# Patient Record
Sex: Male | Born: 1940
Health system: Southern US, Community
[De-identification: ages and names within clinical notes are randomized; demographics above are authoritative.]

## PROBLEM LIST (undated history)

## (undated) DIAGNOSIS — R002 Palpitations: Secondary | ICD-10-CM

## (undated) DIAGNOSIS — I739 Peripheral vascular disease, unspecified: Secondary | ICD-10-CM

## (undated) DIAGNOSIS — R0602 Shortness of breath: Secondary | ICD-10-CM

## (undated) DIAGNOSIS — N529 Male erectile dysfunction, unspecified: Secondary | ICD-10-CM

## (undated) DIAGNOSIS — R9430 Abnormal result of cardiovascular function study, unspecified: Secondary | ICD-10-CM

## (undated) DIAGNOSIS — I4891 Unspecified atrial fibrillation: Secondary | ICD-10-CM

## (undated) DIAGNOSIS — E785 Hyperlipidemia, unspecified: Secondary | ICD-10-CM

## (undated) DIAGNOSIS — Z8489 Family history of other specified conditions: Secondary | ICD-10-CM

## (undated) DIAGNOSIS — I2510 Atherosclerotic heart disease of native coronary artery without angina pectoris: Secondary | ICD-10-CM

## (undated) DIAGNOSIS — J449 Chronic obstructive pulmonary disease, unspecified: Secondary | ICD-10-CM

## (undated) DIAGNOSIS — R911 Solitary pulmonary nodule: Secondary | ICD-10-CM

## (undated) DIAGNOSIS — IMO0002 Reserved for concepts with insufficient information to code with codable children: Secondary | ICD-10-CM

## (undated) DIAGNOSIS — I779 Disorder of arteries and arterioles, unspecified: Secondary | ICD-10-CM

## (undated) DIAGNOSIS — K219 Gastro-esophageal reflux disease without esophagitis: Secondary | ICD-10-CM

## (undated) DIAGNOSIS — I714 Abdominal aortic aneurysm, without rupture, unspecified: Secondary | ICD-10-CM

## (undated) DIAGNOSIS — I10 Essential (primary) hypertension: Secondary | ICD-10-CM

## (undated) HISTORY — DX: Peripheral vascular disease, unspecified: I73.9

## (undated) HISTORY — DX: Essential (primary) hypertension: I10

## (undated) HISTORY — DX: Hyperlipidemia, unspecified: E78.5

## (undated) HISTORY — DX: Disorder of arteries and arterioles, unspecified: I77.9

## (undated) HISTORY — DX: Unspecified atrial fibrillation: I48.91

## (undated) HISTORY — DX: Atherosclerotic heart disease of native coronary artery without angina pectoris: I25.10

## (undated) HISTORY — DX: Abnormal result of cardiovascular function study, unspecified: R94.30

## (undated) HISTORY — DX: Abdominal aortic aneurysm, without rupture: I71.4

## (undated) HISTORY — DX: Gastro-esophageal reflux disease without esophagitis: K21.9

## (undated) HISTORY — DX: Palpitations: R00.2

## (undated) HISTORY — DX: Male erectile dysfunction, unspecified: N52.9

## (undated) HISTORY — DX: Solitary pulmonary nodule: R91.1

## (undated) HISTORY — DX: Abdominal aortic aneurysm, without rupture, unspecified: I71.40

## (undated) HISTORY — PX: BACK SURGERY: SHX140

## (undated) HISTORY — DX: Shortness of breath: R06.02

## (undated) HISTORY — DX: Chronic obstructive pulmonary disease, unspecified: J44.9

## (undated) HISTORY — DX: Reserved for concepts with insufficient information to code with codable children: IMO0002

---

## 2001-11-18 ENCOUNTER — Encounter: Payer: Self-pay | Admitting: Internal Medicine

## 2001-12-10 ENCOUNTER — Ambulatory Visit (HOSPITAL_COMMUNITY): Admission: RE | Admit: 2001-12-10 | Discharge: 2001-12-10 | Payer: Self-pay | Admitting: Cardiology

## 2002-10-05 ENCOUNTER — Encounter: Payer: Self-pay | Admitting: *Deleted

## 2002-10-05 ENCOUNTER — Emergency Department (HOSPITAL_COMMUNITY): Admission: EM | Admit: 2002-10-05 | Discharge: 2002-10-05 | Payer: Self-pay | Admitting: *Deleted

## 2002-11-26 ENCOUNTER — Ambulatory Visit (HOSPITAL_COMMUNITY): Admission: RE | Admit: 2002-11-26 | Discharge: 2002-11-27 | Payer: Self-pay | Admitting: Surgery

## 2002-11-26 ENCOUNTER — Encounter: Payer: Self-pay | Admitting: Surgery

## 2002-11-26 ENCOUNTER — Encounter (INDEPENDENT_AMBULATORY_CARE_PROVIDER_SITE_OTHER): Payer: Self-pay | Admitting: Specialist

## 2002-11-26 HISTORY — PX: CHOLECYSTECTOMY: SHX55

## 2003-01-05 ENCOUNTER — Ambulatory Visit (HOSPITAL_COMMUNITY): Admission: RE | Admit: 2003-01-05 | Discharge: 2003-01-05 | Payer: Self-pay | Admitting: Internal Medicine

## 2003-01-22 ENCOUNTER — Ambulatory Visit (HOSPITAL_COMMUNITY): Admission: RE | Admit: 2003-01-22 | Discharge: 2003-01-22 | Payer: Self-pay | Admitting: Cardiothoracic Surgery

## 2003-01-22 ENCOUNTER — Encounter: Payer: Self-pay | Admitting: Vascular Surgery

## 2003-07-07 ENCOUNTER — Encounter: Admission: RE | Admit: 2003-07-07 | Discharge: 2003-07-07 | Payer: Self-pay | Admitting: Internal Medicine

## 2004-01-04 ENCOUNTER — Ambulatory Visit (HOSPITAL_COMMUNITY): Admission: RE | Admit: 2004-01-04 | Discharge: 2004-01-04 | Payer: Self-pay | Admitting: Cardiology

## 2004-03-02 ENCOUNTER — Ambulatory Visit (HOSPITAL_COMMUNITY): Admission: RE | Admit: 2004-03-02 | Discharge: 2004-03-02 | Payer: Self-pay | Admitting: Vascular Surgery

## 2004-03-06 ENCOUNTER — Ambulatory Visit (HOSPITAL_COMMUNITY): Admission: RE | Admit: 2004-03-06 | Discharge: 2004-03-06 | Payer: Self-pay | Admitting: Vascular Surgery

## 2004-03-27 ENCOUNTER — Ambulatory Visit (HOSPITAL_COMMUNITY): Admission: RE | Admit: 2004-03-27 | Discharge: 2004-03-27 | Payer: Self-pay | Admitting: Vascular Surgery

## 2004-04-06 ENCOUNTER — Inpatient Hospital Stay (HOSPITAL_COMMUNITY): Admission: RE | Admit: 2004-04-06 | Discharge: 2004-04-13 | Payer: Self-pay | Admitting: Vascular Surgery

## 2004-04-06 ENCOUNTER — Encounter (INDEPENDENT_AMBULATORY_CARE_PROVIDER_SITE_OTHER): Payer: Self-pay | Admitting: Specialist

## 2004-04-06 DIAGNOSIS — Z9889 Other specified postprocedural states: Secondary | ICD-10-CM

## 2004-04-06 HISTORY — PX: ABDOMINAL AORTIC ANEURYSM REPAIR: SUR1152

## 2004-06-13 ENCOUNTER — Ambulatory Visit: Payer: Self-pay | Admitting: Cardiology

## 2005-06-22 ENCOUNTER — Ambulatory Visit: Payer: Self-pay | Admitting: Internal Medicine

## 2005-06-22 ENCOUNTER — Ambulatory Visit: Payer: Self-pay | Admitting: Cardiology

## 2005-11-16 ENCOUNTER — Ambulatory Visit: Payer: Self-pay | Admitting: Internal Medicine

## 2005-11-17 ENCOUNTER — Ambulatory Visit: Payer: Self-pay | Admitting: Family Medicine

## 2005-11-21 ENCOUNTER — Ambulatory Visit: Payer: Self-pay | Admitting: Internal Medicine

## 2006-01-07 ENCOUNTER — Ambulatory Visit: Payer: Self-pay | Admitting: Internal Medicine

## 2006-01-21 ENCOUNTER — Ambulatory Visit: Payer: Self-pay | Admitting: Cardiology

## 2006-03-11 ENCOUNTER — Ambulatory Visit: Payer: Self-pay | Admitting: Internal Medicine

## 2006-07-18 ENCOUNTER — Ambulatory Visit: Payer: Self-pay | Admitting: Cardiology

## 2008-04-08 ENCOUNTER — Ambulatory Visit: Payer: Self-pay | Admitting: Cardiology

## 2008-04-21 ENCOUNTER — Encounter: Payer: Self-pay | Admitting: Cardiology

## 2008-04-21 ENCOUNTER — Encounter: Payer: Self-pay | Admitting: Internal Medicine

## 2008-04-21 ENCOUNTER — Ambulatory Visit: Payer: Self-pay

## 2008-06-22 ENCOUNTER — Telehealth: Payer: Self-pay | Admitting: Internal Medicine

## 2008-06-25 ENCOUNTER — Ambulatory Visit: Payer: Self-pay | Admitting: Internal Medicine

## 2008-06-25 DIAGNOSIS — I10 Essential (primary) hypertension: Secondary | ICD-10-CM

## 2008-06-25 DIAGNOSIS — E785 Hyperlipidemia, unspecified: Secondary | ICD-10-CM

## 2008-06-25 DIAGNOSIS — N529 Male erectile dysfunction, unspecified: Secondary | ICD-10-CM | POA: Insufficient documentation

## 2008-06-28 LAB — CONVERTED CEMR LAB
ALT: 17 units/L (ref 0–53)
AST: 19 units/L (ref 0–37)
Albumin: 3.9 g/dL (ref 3.5–5.2)
Alkaline Phosphatase: 83 units/L (ref 39–117)
BUN: 16 mg/dL (ref 6–23)
Basophils Absolute: 0 10*3/uL (ref 0.0–0.1)
Basophils Relative: 0.5 % (ref 0.0–3.0)
Bilirubin, Direct: 0.1 mg/dL (ref 0.0–0.3)
CO2: 30 meq/L (ref 19–32)
Calcium: 9.3 mg/dL (ref 8.4–10.5)
Chloride: 105 meq/L (ref 96–112)
Cholesterol: 95 mg/dL (ref 0–200)
Creatinine, Ser: 1.1 mg/dL (ref 0.4–1.5)
Eosinophils Absolute: 0.1 10*3/uL (ref 0.0–0.7)
Eosinophils Relative: 1.9 % (ref 0.0–5.0)
GFR calc Af Amer: 86 mL/min
GFR calc non Af Amer: 71 mL/min
Glucose, Bld: 77 mg/dL (ref 70–99)
HCT: 47.3 % (ref 39.0–52.0)
HDL: 34.7 mg/dL — ABNORMAL LOW (ref >39.0)
Hemoglobin: 16.1 g/dL (ref 13.0–17.0)
LDL Cholesterol: 47 mg/dL (ref 0–99)
Lymphocytes Relative: 21.4 % (ref 12.0–46.0)
MCHC: 34 g/dL (ref 30.0–36.0)
MCV: 92.6 fL (ref 78.0–100.0)
Monocytes Absolute: 0.6 10*3/uL (ref 0.1–1.0)
Monocytes Relative: 10.9 % (ref 3.0–12.0)
Neutro Abs: 3.3 10*3/uL (ref 1.4–7.7)
Neutrophils Relative %: 65.3 % (ref 43.0–77.0)
Phosphorus: 3.2 mg/dL (ref 2.3–4.6)
Platelets: 180 10*3/uL (ref 150–400)
Potassium: 4.3 meq/L (ref 3.5–5.1)
RBC: 5.11 M/uL (ref 4.22–5.81)
RDW: 12.6 % (ref 11.5–14.6)
Sodium: 141 meq/L (ref 135–145)
TSH: 1.5 microintl units/mL (ref 0.35–5.50)
Total Bilirubin: 0.7 mg/dL (ref 0.3–1.2)
Total CHOL/HDL Ratio: 2.7
Total Protein: 6.6 g/dL (ref 6.0–8.3)
Triglycerides: 65 mg/dL (ref 0–149)
VLDL: 13 mg/dL (ref 0–40)
WBC: 5.1 10*3/uL (ref 4.5–10.5)

## 2008-07-15 ENCOUNTER — Telehealth: Payer: Self-pay | Admitting: Internal Medicine

## 2009-04-02 DIAGNOSIS — Z8719 Personal history of other diseases of the digestive system: Secondary | ICD-10-CM

## 2009-04-02 DIAGNOSIS — J439 Emphysema, unspecified: Secondary | ICD-10-CM

## 2009-05-01 ENCOUNTER — Encounter: Payer: Self-pay | Admitting: Cardiology

## 2009-05-03 ENCOUNTER — Ambulatory Visit: Payer: Self-pay | Admitting: Cardiology

## 2009-05-04 ENCOUNTER — Encounter: Payer: Self-pay | Admitting: Cardiology

## 2009-05-17 ENCOUNTER — Ambulatory Visit: Payer: Self-pay

## 2009-05-17 ENCOUNTER — Encounter: Payer: Self-pay | Admitting: Cardiology

## 2010-04-10 ENCOUNTER — Encounter: Payer: Self-pay | Admitting: Cardiology

## 2010-04-11 ENCOUNTER — Ambulatory Visit: Payer: Self-pay | Admitting: Cardiology

## 2010-04-12 ENCOUNTER — Telehealth: Payer: Self-pay | Admitting: Cardiology

## 2010-04-12 ENCOUNTER — Encounter: Payer: Self-pay | Admitting: Cardiology

## 2010-04-17 ENCOUNTER — Ambulatory Visit: Payer: Self-pay | Admitting: Cardiovascular Disease

## 2010-04-19 ENCOUNTER — Ambulatory Visit: Payer: Self-pay | Admitting: Internal Medicine

## 2010-04-20 LAB — CONVERTED CEMR LAB
Albumin: 4.1 g/dL (ref 3.5–5.2)
BUN: 16 mg/dL (ref 6–23)
Basophils Absolute: 0 10*3/uL (ref 0.0–0.1)
Basophils Relative: 0.4 % (ref 0.0–3.0)
CO2: 29 meq/L (ref 19–32)
Calcium: 9.9 mg/dL (ref 8.4–10.5)
Chloride: 109 meq/L (ref 96–112)
Creatinine, Ser: 1.2 mg/dL (ref 0.4–1.5)
Eosinophils Absolute: 0.1 10*3/uL (ref 0.0–0.7)
Eosinophils Relative: 1.8 % (ref 0.0–5.0)
GFR calc non Af Amer: 65.4 mL/min (ref >60)
Glucose, Bld: 68 mg/dL — ABNORMAL LOW (ref 70–99)
HCT: 46.7 % (ref 39.0–52.0)
Hemoglobin: 15.9 g/dL (ref 13.0–17.0)
Lymphocytes Relative: 23.4 % (ref 12.0–46.0)
Lymphs Abs: 1.5 10*3/uL (ref 0.7–4.0)
MCHC: 34.2 g/dL (ref 30.0–36.0)
MCV: 92.3 fL (ref 78.0–100.0)
Monocytes Absolute: 0.8 10*3/uL (ref 0.1–1.0)
Monocytes Relative: 11.5 % (ref 3.0–12.0)
Neutro Abs: 4.1 10*3/uL (ref 1.4–7.7)
Neutrophils Relative %: 62.9 % (ref 43.0–77.0)
PSA: 1.51 ng/mL (ref 0.10–4.00)
Phosphorus: 3.3 mg/dL (ref 2.3–4.6)
Platelets: 182 10*3/uL (ref 150.0–400.0)
Potassium: 4.5 meq/L (ref 3.5–5.1)
RBC: 5.06 M/uL (ref 4.22–5.81)
RDW: 13.3 % (ref 11.5–14.6)
Sodium: 144 meq/L (ref 135–145)
TSH: 1.33 microintl units/mL (ref 0.35–5.50)
WBC: 6.5 10*3/uL (ref 4.5–10.5)

## 2010-06-06 HISTORY — PX: PILONIDAL CYST EXCISION: SHX744

## 2010-06-21 ENCOUNTER — Telehealth: Payer: Self-pay | Admitting: Cardiology

## 2010-09-03 LAB — CONVERTED CEMR LAB
ALT: 21 units/L (ref 0–53)
AST: 23 units/L (ref 0–37)
Albumin: 4 g/dL (ref 3.5–5.2)
Alkaline Phosphatase: 103 units/L (ref 39–117)
Bilirubin, Direct: 0.1 mg/dL (ref 0.0–0.3)
Cholesterol: 103 mg/dL (ref 0–200)
Cholesterol: 109 mg/dL (ref 0–200)
HDL: 33.5 mg/dL — ABNORMAL LOW (ref >39.00)
HDL: 41.3 mg/dL (ref >39.00)
LDL Cholesterol: 58 mg/dL (ref 0–99)
LDL Cholesterol: 59 mg/dL (ref 0–99)
Total Bilirubin: 0.6 mg/dL (ref 0.3–1.2)
Total CHOL/HDL Ratio: 3
Total CHOL/HDL Ratio: 3
Total Protein: 6.9 g/dL (ref 6.0–8.3)
Triglycerides: 45 mg/dL (ref 0.0–149.0)
Triglycerides: 56 mg/dL (ref 0.0–149.0)
VLDL: 11.2 mg/dL (ref 0.0–40.0)
VLDL: 9 mg/dL (ref 0.0–40.0)

## 2010-09-05 NOTE — Assessment & Plan Note (Signed)
Summary: yearly/sl      Allergies Added: NKDA  Visit Type:  Follow-up Primary Provider:  Tillman Abide, MD  CC:  CAD.  History of Present Illness: The patient is seen for followup of coronary artery disease.  He is doing well.  Is not having chest pain or shortness of breath.  Is fully active.  I saw him last May 03, 2009.  He's had followup carotid Doppler since then which showed mild stable disease.  He has not had follow up lipids and this will be arranged soon.  Current Medications (verified): 1)  Crestor 20 Mg  Tabs (Rosuvastatin Calcium) .... Take 1 Tablet By Mouth Once A Day 2)  Dilt-Cd 240 Mg Xr24h-Cap (Diltiazem Hcl Coated Beads) .Marland Kitchen.. 1 Daily By Mouth 3)  Bayer Aspirin Ec Low Dose 81 Mg Tbec (Aspirin) .Marland Kitchen.. 1daily By Mouth 4)  Metoprolol Tartrate 50 Mg Tabs (Metoprolol Tartrate) .... Take 1/2 Tablet By Mouth Two Times A Day  Allergies (verified): No Known Drug Allergies  Past History:  Past Medical History: CAD... moderate.... catheterization 2005... / nuclear scan September 2009 no ischemia EF... 60%... echo.  September, 2009 Carotid artery disease... minimal  ..Doppler 2004... 0-39% bilateral  /   doppler..05/17/2009....0-39% bilateral GERD,HX OF BACK SURGERY (1970) (ICD-V45.89) CHOLECYSTECTOMY, HX OF (ICD-V45.79) DYSPNEA ON EXERTION (ICD-786.09) * CYST ON BUTTOCKS,HX OF (HEALED) HEMORRHOIDS, HX OF (ICD-V12.79) ATRIAL FIBRILLATION, HX OF (ICD-V12.59)...postoperative in the past PALPITATIONS, HX OF (ICD-V12.50) COPD (ICD-496) LUNG NODULE,HX OF (ICD-518.89)...followup by pulmonary in the past. AAA... repaired 2005 Dr.Dickson ERECTILE DYSFUNCTION, ORGANIC (ICD-607.84) HYPERLIPIDEMIA (ICD-272.4) HYPERTENSION (ICD-401.9) Cholecystectomy Tobacco abuse  Review of Systems       Patient denies fever, chills, headache, sweats, rash, change in vision, change in hearing, chest pain, cough or vomiting, urinary symptoms.  All other systems are reviewed and are  negative.  Vital Signs:  Patient profile:   70 year old male Height:      69.50 inches Weight:      176 pounds BMI:     25.71 Pulse rate:   68 / minute BP sitting:   142 / 76  (left arm) Cuff size:   regular  Vitals Entered By: Hardin Negus, RMA (April 11, 2010 10:06 AM)  Physical Exam  General:  patient is stable. Eyes:  no xanthelasma. Neck:  no carotid bruits. Lungs:  lungs are clear.  Respiratory effort is nonlabored. Heart:  cardiac exam reveals S1-S2.  No clicks or significant murmurs. Abdomen:  abdomen is soft. Extremities:  no peripheral edema. Psych:  patient is oriented to person time and place.  Affect is normal.   Impression & Recommendations:  Problem # 1:  CAROTID ARTERY DISEASE (ICD-433.10)  His updated medication list for this problem includes:    Bayer Aspirin Ec Low Dose 81 Mg Tbec (Aspirin) .Marland Kitchen... 1daily by mouth The patient's carotids are being followed and they are stable.  Problem # 2:  DYSPNEA ON EXERTION (ICD-786.09)  His updated medication list for this problem includes:    Dilt-cd 240 Mg Xr24h-cap (Diltiazem hcl coated beads) .Marland Kitchen... 1 daily by mouth    Bayer Aspirin Ec Low Dose 81 Mg Tbec (Aspirin) .Marland Kitchen... 1daily by mouth    Metoprolol Tartrate 50 Mg Tabs (Metoprolol tartrate) .Marland Kitchen... Take 1/2 tablet by mouth two times a day Is no shortness of breath.  No further workup.  Problem # 3:  PALPITATIONS, HX OF (ICD-V12.50) Patient has no palpitations.  No further workup.  Problem # 4:  ABDOMINAL AORTIC ANEURYSM REPAIR,  HX OF (ICD-V15.1) The patient's abdominal aortic aneurysm was repaired with 2005.  I will decide in the future if he should have a followup ultrasound.  Problem # 5:  CORONARY ARTERY DISEASE (ICD-414.00)  His updated medication list for this problem includes:    Dilt-cd 240 Mg Xr24h-cap (Diltiazem hcl coated beads) .Marland Kitchen... 1 daily by mouth    Bayer Aspirin Ec Low Dose 81 Mg Tbec (Aspirin) .Marland Kitchen... 1daily by mouth    Metoprolol  Tartrate 50 Mg Tabs (Metoprolol tartrate) .Marland Kitchen... Take 1/2 tablet by mouth two times a day Coronary disease is stable.  EKG is done today and reviewed by me.  It is normal.  No further workup.  Problem # 6:  HYPERLIPIDEMIA (ICD-272.4)  His updated medication list for this problem includes:    Crestor 20 Mg Tabs (Rosuvastatin calcium) .Marland Kitchen... Take 1 tablet by mouth once a day The patient needs to have fasting lipids.  This will be arranged.  He'll see him back in one year for followup.  Orders: TLB-Lipid Panel (80061-LIPID)  Problem # 7:  LUNG NODULE,HX OF (ICD-518.89)  The patient has had lung evaluations in the past.  There was question of needing followup.  When questioning him today he tells me that he has not had any significant followup.  This is necessary.  Chest x-ray will be done today or in the near future and I am arranging for him to see Dr.Letvak for general followup and to decide if any pulmonary followup as needed.  Orders: T-2 View CXR (71020TC)  Problem # 8:  TOBACCO ABUSE (ICD-305.1) The patient is using a small amount of chewing tobacco.  I counseled him to stop completely.  Other Orders: EKG w/ Interpretation (93000)  Patient Instructions: 1)  Your physician recommends that you return for a FASTING lipid profile: (272.2) 2)  A chest x-ray takes a picture of the organs and structures inside the chest, including the heart, lungs, and blood vessels. This test can show several things, including, whether the heart is enlarged; whether fluid is building up in the lungs; and whether pacemaker / defibrillator leads are still in place. 3)  You have been referred to Dr Alphonsus Sias for follow-up appt. regarding your lung nodule, we will send him a copy of your chest xray. 4)  Your physician wants you to follow-up in:  1 year.  You will receive a reminder letter in the mail two months in advance. If you don't receive a letter, please call our office to schedule the follow-up  appointment.   Appended Document: yearly/sl Rich, Please see this note.  I ordered a CXRay for today. Patient is to see you next week.    Trey Paula

## 2010-09-05 NOTE — Progress Notes (Signed)
Summary: pt at dentist ofc   Phone Note From Other Clinic   Caller: Nurse Summary of Call: per Clydie Braun Dr Precious Bard pt is at office to have extractions. Pt has infection what premeds or other does pt need?  ofc [phone]  can speak with anyone concerning pt.  Initial call taken by: Edman Circle,  June 21, 2010 2:10 PM  Follow-up for Phone Call        they have given pt a dose of amoxicillin wanted to make sure Dr Myrtis Ser was ok w/this and didn't want to do anything else, ok per Dr Boykin Nearing, RN  June 21, 2010 2:48 PM

## 2010-09-05 NOTE — Miscellaneous (Signed)
  Clinical Lists Changes  Observations: Added new observation of PAST MED HX: CAD... moderate.... catheterization 2005... / nuclear scan September 2009 no ischemia EF... 60%... echo.  September, 2009 Carotid artery disease... minimal  ..Doppler 2004... 0-39% bilateral  /   doppler..05/17/2009....0-39% bilateral GERD,HX OF BACK SURGERY (1970) (ICD-V45.89) CHOLECYSTECTOMY, HX OF (ICD-V45.79) DYSPNEA ON EXERTION (ICD-786.09) * CYST ON BUTTOCKS,HX OF (HEALED) HEMORRHOIDS, HX OF (ICD-V12.79) ATRIAL FIBRILLATION, HX OF (ICD-V12.59)...postoperative in the past PALPITATIONS, HX OF (ICD-V12.50) COPD (ICD-496) LUNG NODULE,HX OF (ICD-518.89) AAA... repaired 2005 Dr.Dickson ERECTILE DYSFUNCTION, ORGANIC (ICD-607.84) HYPERLIPIDEMIA (ICD-272.4) HYPERTENSION (ICD-401.9) Cholecystectomy  (04/10/2010 13:04) Added new observation of PRIMARY MD: Tillman Abide, MD (04/10/2010 13:04)       Past History:  Past Medical History: CAD... moderate.... catheterization 2005... / nuclear scan September 2009 no ischemia EF... 60%... echo.  September, 2009 Carotid artery disease... minimal  ..Doppler 2004... 0-39% bilateral  /   doppler..05/17/2009....0-39% bilateral GERD,HX OF BACK SURGERY (1970) (ICD-V45.89) CHOLECYSTECTOMY, HX OF (ICD-V45.79) DYSPNEA ON EXERTION (ICD-786.09) * CYST ON BUTTOCKS,HX OF (HEALED) HEMORRHOIDS, HX OF (ICD-V12.79) ATRIAL FIBRILLATION, HX OF (ICD-V12.59)...postoperative in the past PALPITATIONS, HX OF (ICD-V12.50) COPD (ICD-496) LUNG NODULE,HX OF (ICD-518.89) AAA... repaired 2005 Dr.Dickson ERECTILE DYSFUNCTION, ORGANIC (ICD-607.84) HYPERLIPIDEMIA (ICD-272.4) HYPERTENSION (ICD-401.9) Cholecystectomy

## 2010-09-05 NOTE — Assessment & Plan Note (Signed)
Summary: dr Myrtis Ser ref for lung nogule/dlo   Vital Signs:  Patient profile:   70 year old male Weight:      177 pounds Temp:     98.6 degrees F oral Pulse rate:   60 / minute Pulse rhythm:   regular BP sitting:   110 / 70  (left arm) Cuff size:   large  Vitals Entered By: Mervin Hack CMA Duncan Dull) (April 19, 2010 11:15 AM) CC: lung nodule   History of Present Illness: Doing okay in general Now walking 3 miles per day Still hunting--waiting for season to start and working on deer stands  Heart has been good Good stamina No chest pain Notes DOE with strenuous exercise---like heavy lifting (not just walking though) No edema No palpitations  urine is fine No nocturia no daytime problems  Good mood no depression  No stomach troubles  Occ cough No sig mucus   Preventive Screening-Counseling & Management  Alcohol-Tobacco     Smoking Status: quit > 6 months     Smoking Cessation Counseling: no  Allergies: No Known Drug Allergies  Past History:  Past medical, surgical, family and social histories (including risk factors) reviewed for relevance to current acute and chronic problems.  Past Medical History: CAD... moderate.... catheterization 2005... / nuclear scan September 2009 no ischemia               EF... 60%... echo.  September, 2009 Carotid artery disease... minimal  ..Doppler 2004... 0-39% bilateral  /   doppler..05/17/2009....0-39% bilateral GERD Hemorrhoids Atrial fibrillation...postoperative in the past COPD  Lung nodule...followup by pulmonary in the past. Erectile dysfunction Hyperliidemia Hypertension  Past Surgical History: BACK SURGERY (1970) ABDOMINAL AORTIC ANEURYSM REPAIR,( 04-06-2004)--Dr [facility] TRANSLUMINAL CORONARY ANGIOPLASTY,( 12-10-2001) & (01-04-2004) CHOLECYSTECTOMY (11-26-2002)  Family History: Reviewed history from 06/25/2008 and no changes required. Dad died @69  MI Mom died @64  CVA, DM, ?HTN All mom's family  died of strokes 2 brothers --1 died of suicide 3 sisters--1 died of gall bladder cancer  Social History: Reviewed history from 06/25/2008 and no changes required. 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   Neck:  supple, no masses, no thyromegaly, no carotid bruits, and no cervical lymphadenopathy.   Lungs:  normal respiratory effort, no intercostal retractions, no accessory muscle use, and normal breath sounds.   Heart:  normal rate, regular rhythm, no murmur, and no gallop.   Abdomen:  soft and non-tender.   Msk:  no joint tenderness and no joint swelling.   Extremities:  no edema Psych:  normally interactive, good eye contact, not anxious appearing, and not depressed appearing.     Impression & Recommendations:  Problem # 1:  LUNG NODULE,HX OF (ICD-518.89) Assessment Comment Only recent CT scan okay will plan yearly follow up  Problem # 2:  HYPERTENSION (ICD-401.9) Assessment: Unchanged  good control no changes needed  His updated medication list for this problem includes:    Metoprolol Tartrate 50 Mg Tabs (Metoprolol tartrate) .Marland Kitchen... Take 1/2 tablet by mouth two times a day    Dilt-cd 240 Mg Xr24h-cap (Diltiazem hcl coated beads) .Marland Kitchen... 1 daily by mouth  BP today: 110/70 Prior BP: 142/76 (04/11/2010)  Labs Reviewed: K+: 4.3 (06/25/2008) Creat: : 1.1 (06/25/2008)   Chol: 103 (04/11/2010)   HDL: 33.50 (04/11/2010)   LDL: 58 (04/11/2010)   TG: 56.0 (04/11/2010)  Orders: TLB-Renal Function Panel (80069-RENAL) TLB-TSH (Thyroid Stimulating Hormone) (84443-TSH) TLB-CBC Platelet - w/Differential (85025-CBCD) Venipuncture (16109)  Problem # 3:   HYPERLIPIDEMIA (ICD-272.4) Assessment: Unchanged at goal on Rx no changes needed  His updated medication list for this problem includes:    Crestor 20 Mg Tabs (Rosuvastatin calcium) .Marland Kitchen... Take 1 tablet by mouth once a day  Labs Reviewed: SGOT: 23 (05/03/2009)   SGPT: 21 (05/03/2009)   HDL:33.50 (04/11/2010), 41.30 (05/03/2009)  LDL:58 (04/11/2010), 59 (05/03/2009)  Chol:103 (04/11/2010), 109 (05/03/2009)  Trig:56.0 (04/11/2010), 45.0 (05/03/2009)  Problem # 4:  COPD (ICD-496) Assessment: Comment Only no sig symptoms Rx not indicated  Problem # 5:  Screening PSA (ICD-V76.44) Assessment: Comment Only discussed will check today and then not again if normal  Complete Medication List: 1)  Metoprolol Tartrate 50 Mg Tabs (Metoprolol tartrate) .... Take 1/2 tablet by mouth two times a day 2)  Crestor 20 Mg Tabs (Rosuvastatin calcium) .... Take 1 tablet by mouth once a day 3)  Dilt-cd 240 Mg Xr24h-cap (Diltiazem hcl coated beads) .Marland Kitchen.. 1 daily by mouth 4)  Bayer Aspirin Ec Low Dose 81 Mg Tbec (Aspirin) .Marland Kitchen.. 1daily by mouth  Other Orders: TLB-PSA (Prostate Specific Antigen) (84153-PSA)  Patient Instructions: 1)  Please schedule a follow-up appointment in 1 year.   Current Allergies (reviewed today): No known allergies    Appended Document: dr Myrtis Ser ref for lung nogule/dlo     Clinical Lists Changes  Orders: Added new Service order of Tdap => 44yrs IM (60454) - Signed Added new Service order of Admin 1st Vaccine (09811) - Signed Added new Service order of Admin 1st Vaccine [facility]) [phone]) - Signed Added new Service order of Flu Vaccine 64yrs + (95621) - Signed Added new Service order of Admin of Any Addtl Vaccine (30865) - Signed Added new Service order of Admin of Any Addtl Vaccine (State) [phone]) - Signed Observations: Added new observation of FLU VAX#1VIS: 02/28/10 version given April 19, 2010. (04/19/2010 12:37) Added new observation of FLU VAXLOT: [id number]  (04/19/2010 12:37) Added new observation of FLU VAX EXP: 02/03/2011 (04/19/2010 12:37) Added new observation of FLU VAXBY: DeShannon Smith CMA (AAMA) (04/19/2010 12:37) Added new observation of FLU VAXRTE: IM (04/19/2010 12:37) Added new observation of FLU VAX DSE: 0.5 ml (04/19/2010 12:37) Added new observation of FLU VAXMFR: Gl[employer] (04/19/2010 12:37) Added new observation of FLU VAX SITE: left deltoid (04/19/2010 12:37) Added new observation of FLU VAX: Fluvax 3+ (04/19/2010 12:37) Added new observation of TD BOOST VIS: 06/23/08 version given April 19, 2010. (04/19/2010 12:37) Added new observation of TD BOOSTERLO: [id number] (04/19/2010 12:37) Added new observation of TD BOOST EXP: 04/26/2012 (04/19/2010 12:37) Added new observation of TD BOOSTERBY: DeShannon Smith CMA (AAMA) (04/19/2010 12:37) Added new observation of TD BOOSTERRT: IM (04/19/2010 12:37) Added new observation of TDBOOSTERDSE: 0.5 ml (04/19/2010 12:37) Added new observation of TD BOOSTERMF: Gl[employer] (04/19/2010 12:37) Added new observation of TD BOOST SIT: right deltoid (04/19/2010 12:37) Added new observation of TD BOOSTER: Tdap (04/19/2010 12:37)       Tetanus/Td Vaccine    Vaccine Type: Tdap    Site: right deltoid    Mfr: GlaxoSmithKline    Dose: 0.5 ml    Route: IM    Given by: Mervin Hack CMA (AAMA)    Exp. Date: 04/26/2012    Lot #:  [id number]    VIS given: 06/23/08 version given April 19, 2010.  Influenza Vaccine    Vaccine Type: Fluvax 3+    Site: left deltoid    Mfr: GlaxoSmithKline    Dose: 0.5 ml    Route: IM    Given by: Mervin Hack CMA (AAMA)    Exp. Date: 02/03/2011    Lot #: [id number]    VIS given: 02/28/10 version given April 19, 2010.  Flu Vaccine Consent Questions    Do you have a history of severe allergic reactions to this vaccine? no    Any prior history of allergic reactions to egg and/or gelatin? no    Do you have a sensitivity to the  preservative Thimersol? no    Do you have a past history of Guillan-Barre Syndrome? no    Do you currently have an acute febrile illness? no    Have you ever had a severe reaction to latex? no    Vaccine information given and explained to patient? yes

## 2010-09-05 NOTE — Letter (Signed)
Summary: Custom - Lipid  [facility], Main Office  [address]   [place], [place] [address]   Phone: [phone]  Fax: [phone]     April 12, 2010 MRN: [id number]   JIMI SCHAPPERT [address] [place], [place]  [address]   Dear Mr. Kazmi,  We have reviewed your cholesterol results.  They are as follows:     Total Cholesterol:    103 (Desirable: less than 200)       HDL  Cholesterol:     33.50  (Desirable: greater than 40 for men and 50 for women)       LDL Cholesterol:       58  (Desirable: less than 100 for low risk and less than 70 for moderate to high risk)       Triglycerides:       56.0  (Desirable: less than 150)  Our recommendations include:  Looks Good, continue current medications   Call our office at the number listed above if you have any questions.  Lowering your LDL cholesterol is important, but it is only one of a large number of "risk factors" that may indicate that you are at risk for heart disease, stroke or other complications of hardening of the arteries.  Other risk factors include:   A.  Cigarette Smoking* B.  High Blood Pressure* C.  Obesity* D.   Low HDL Cholesterol (see yours above)* E.   Diabetes Mellitus (higher risk if your is uncontrolled) F.  Family history of premature heart disease G.  Previous history of stroke or cardiovascular disease    *These are risk factors YOU HAVE CONTROL OVER.  For more information, visit .  There is now evidence that lowering the TOTAL CHOLESTEROL AND LDL CHOLESTEROL can reduce the risk of heart disease.  The American Heart Association recommends the following guidelines for the treatment of elevated cholesterol:  1.  If there is now current heart disease and less than two risk factors, TOTAL CHOLESTEROL should be less than 200 and LDL CHOLESTEROL should be less than 100. 2.  If there is current heart disease or two or more risk factors, TOTAL CHOLESTEROL should be less than 200 and LDL  CHOLESTEROL should be less than 70.  A diet low in cholesterol, saturated fat, and calories is the cornerstone of treatment for elevated cholesterol.  Cessation of smoking and exercise are also important in the management of elevated cholesterol and preventing vascular disease.  Studies have shown that 30 to 60 minutes of physical activity most days can help lower blood pressure, lower cholesterol, and keep your weight at a healthy level.  Drug therapy is used when cholesterol levels do not respond to therapeutic lifestyle changes (smoking cessation, diet, and exercise) and remains unacceptably high.  If medication is started, it is important to have you levels checked periodically to evaluate the need for further treatment options.  Thank you,    [employer] Team

## 2010-09-05 NOTE — Progress Notes (Signed)
----   Converted from flag ---- ---- 04/12/2010 8:07 AM, Cindee Salt MD wrote: I will get a chest CT scan on him He has a visit next week ------------------------------

## 2010-09-20 ENCOUNTER — Encounter: Payer: Self-pay | Admitting: Family Medicine

## 2010-09-20 ENCOUNTER — Ambulatory Visit (INDEPENDENT_AMBULATORY_CARE_PROVIDER_SITE_OTHER): Payer: Medicare Other | Admitting: Family Medicine

## 2010-09-20 DIAGNOSIS — L0501 Pilonidal cyst with abscess: Secondary | ICD-10-CM | POA: Insufficient documentation

## 2010-09-27 NOTE — Assessment & Plan Note (Signed)
Summary: cyst on back/alc   Vital Signs:  Patient profile:   70 year old male Weight:      179 pounds Temp:     98.4 degrees F oral Pulse rate:   76 / minute Pulse rhythm:   regular BP sitting:   126 / 82  (left arm) Cuff size:   large  Vitals Entered By: Selena Batten Dance CMA (AAMA) (September 20, 2010 10:00 AM) CC: Cyst on tail bone area   History of Present Illness: CC: cyst on tailbone  Noticed cyst there for years.  started irritating a few days ago.  No fevers/chills, abd pain, n/v/d.  Not draining currently but usually eventually drains.  last night unable to sleep because kept aching.  has had in past, pt thinks has been lanced in past maybe 5-7 yeras ago.  recurrent, comes and goes, never fully healed.  Current Medications (verified): 1)  Metoprolol Tartrate 50 Mg Tabs (Metoprolol Tartrate) .... Take 1/2 Tablet By Mouth Two Times A Day 2)  Crestor 20 Mg  Tabs (Rosuvastatin Calcium) .... Take 1 Tablet By Mouth Once A Day 3)  Dilt-Cd 240 Mg Xr24h-Cap (Diltiazem Hcl Coated Beads) .Marland Kitchen.. 1 Daily By Mouth 4)  Bayer Aspirin Ec Low Dose 81 Mg Tbec (Aspirin) .Marland Kitchen.. 1daily By Mouth  Allergies (verified): No Known Drug Allergies  Past History:  Past Medical History: Last updated: 04/19/2010 CAD... moderate.... catheterization 2005... / nuclear scan September 2009 no ischemia               EF... 60%... echo.  September, 2009 Carotid artery disease... minimal  ..Doppler 2004... 0-39% bilateral  /   doppler..05/17/2009....0-39% bilateral GERD Hemorrhoids Atrial fibrillation...postoperative in the past COPD  Lung nodule...followup by pulmonary in the past. 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   Skin:  midline gluteal cleft with small abscess/induration going into right superior buttock.  second abscess right superior gluteal cleft with boil, erythema and tenderness.  L superior gluteal cleft with no erythema but old dimple    Impression & Recommendations:  Problem # 1:  PILONIDAL CYST, WITH ABSCESS (ICD-685.0) concern for pilonidal disease given longstanding duration and multiple lesions today.  recommend warm compresses/warm sitz baths for now, started on abx for 10 days.  surgical referral for eval pilonidal disease as patient would like resolution of recurrent infections.  Orders: Surgical Referral (Surgery)  Complete Medication List: 1)  Metoprolol Tartrate 50 Mg Tabs (Metoprolol tartrate) .... Take 1/2 tablet by mouth two times a day 2)  Crestor 20 Mg Tabs (Rosuvastatin calcium) .... Take 1 tablet by mouth once a day 3)  Dilt-cd 240 Mg Xr24h-cap (Diltiazem hcl coated beads) .Marland Kitchen.. 1 daily by mouth 4)  Bayer Aspirin Ec Low Dose 81 Mg Tbec (Aspirin) .Marland Kitchen.. 1daily by mouth 5)  Bactrim Ds 800-160 Mg Tabs (Sulfamethoxazole-trimethoprim) .... One twice daily x 10 days  Patient Instructions: 1)  Start bactrim for antibiotic. 2)  warm compresses to area as well as warm sitz baths. 3)  referral to surgery for evaluation of pilonidal cyst. 4)  Pass by Marion's office for referral. Prescriptions: BACTRIM DS 800-160 MG TABS (SULFAMETHOXAZOLE-TRIMETHOPRIM) one  twice daily x 10 days  #20 x 0   Entered and Authorized by:   Eustaquio Boyden  MD   Signed by:   Eustaquio Boyden  MD on 09/20/2010   Method used:   Electronically to        [facility]  [address] [phone].* (retail)       [address]       [place], [place]  [address]       Ph: [phone] or [phone]       Fax: [phone]   RxID:    [id number]    Orders Added: 1)  Surgical Referral [Surgery] 2)  Est. Patient Level III [69485]    Current Allergies (reviewed today): No known allergies

## 2010-10-02 ENCOUNTER — Encounter: Payer: Self-pay | Admitting: Internal Medicine

## 2010-10-02 ENCOUNTER — Ambulatory Visit (INDEPENDENT_AMBULATORY_CARE_PROVIDER_SITE_OTHER): Payer: Medicare Other | Admitting: Internal Medicine

## 2010-10-02 DIAGNOSIS — L270 Generalized skin eruption due to drugs and medicaments taken internally: Secondary | ICD-10-CM | POA: Insufficient documentation

## 2010-10-03 ENCOUNTER — Encounter: Payer: Self-pay | Admitting: Internal Medicine

## 2010-10-10 ENCOUNTER — Encounter (HOSPITAL_BASED_OUTPATIENT_CLINIC_OR_DEPARTMENT_OTHER)
Admission: RE | Admit: 2010-10-10 | Discharge: 2010-10-10 | Disposition: A | Discharge status: Home / Self Care | Payer: Medicare Other | Source: Ambulatory Visit | Attending: Surgery | Admitting: Surgery

## 2010-10-10 LAB — BASIC METABOLIC PANEL
BUN: 18 mg/dL (ref 6–23)
CO2: 30 mEq/L (ref 19–32)
Calcium: 9.1 mg/dL (ref 8.4–10.5)
Chloride: 108 mEq/L (ref 96–112)
Creatinine, Ser: 1.12 mg/dL (ref 0.4–1.5)
GFR calc Af Amer: >60 mL/min (ref >60)
GFR calc non Af Amer: >60 mL/min (ref >60)
Glucose, Bld: 88 mg/dL (ref 70–99)
Potassium: 4.7 mEq/L (ref 3.5–5.1)
Sodium: 142 mEq/L (ref 135–145)

## 2010-10-12 ENCOUNTER — Ambulatory Visit (HOSPITAL_BASED_OUTPATIENT_CLINIC_OR_DEPARTMENT_OTHER)
Admission: RE | Admit: 2010-10-12 | Discharge: 2010-10-12 | Disposition: A | Discharge status: Home / Self Care | Payer: Medicare Other | Source: Ambulatory Visit | Attending: Surgery | Admitting: Surgery

## 2010-10-12 ENCOUNTER — Other Ambulatory Visit: Payer: Self-pay | Admitting: Surgery

## 2010-10-12 DIAGNOSIS — I10 Essential (primary) hypertension: Secondary | ICD-10-CM | POA: Insufficient documentation

## 2010-10-12 DIAGNOSIS — K219 Gastro-esophageal reflux disease without esophagitis: Secondary | ICD-10-CM | POA: Insufficient documentation

## 2010-10-12 DIAGNOSIS — J4489 Other specified chronic obstructive pulmonary disease: Secondary | ICD-10-CM | POA: Insufficient documentation

## 2010-10-12 DIAGNOSIS — L0591 Pilonidal cyst without abscess: Secondary | ICD-10-CM | POA: Insufficient documentation

## 2010-10-12 DIAGNOSIS — Z01812 Encounter for preprocedural laboratory examination: Secondary | ICD-10-CM | POA: Insufficient documentation

## 2010-10-12 DIAGNOSIS — E7800 Pure hypercholesterolemia, unspecified: Secondary | ICD-10-CM | POA: Insufficient documentation

## 2010-10-12 DIAGNOSIS — J449 Chronic obstructive pulmonary disease, unspecified: Secondary | ICD-10-CM | POA: Insufficient documentation

## 2010-10-12 LAB — POCT HEMOGLOBIN-HEMACUE: Hemoglobin: 16.7 g/dL (ref 13.0–17.0)

## 2010-10-12 NOTE — Assessment & Plan Note (Signed)
Summary: RASH ALL OVER BODY   Vital Signs:  Patient profile:   70 year old male Weight:      177.50 pounds Temp:     98.0 degrees F oral Pulse rate:   60 / minute Pulse rhythm:   regular BP sitting:   120 / 70  (left arm)  Vitals Entered By: Sydell Axon LPN (October 02, 2010 10:55 AM) CC: Rash alll over, patient thinks that he may be allergice to Bactrim that he was recently given   History of Present Illness: Cyst seems better has surgical eval tomorrow  developed rash a couple of days ago some itching stopped the sulfa  no open areas  No fever  Allergies (verified): 1)  Sulfamethoxazole-Tmp Ds (Sulfamethoxazole-Trimethoprim)  Past History:  Past medical, surgical, family and social histories (including risk factors) reviewed for relevance to current acute and chronic problems.  Past Medical History: Reviewed history from 04/19/2010 and no changes required. CAD... moderate.... catheterization 2005... / nuclear scan September 2009 no ischemia               EF... 60%... echo.  September, 2009 Carotid artery disease... minimal  ..Doppler 2004... 0-39% bilateral  /   doppler..05/17/2009....0-39% bilateral GERD Hemorrhoids Atrial fibrillation...postoperative in the past COPD  Lung nodule...followup by pulmonary in the past. Erectile dysfunction Hyperliidemia Hypertension  Past Surgical History: Reviewed history from 04/19/2010 and no changes required. BACK SURGERY (1970) ABDOMINAL AORTIC ANEURYSM REPAIR,( 04-06-2004)--Dr [facility] TRANSLUMINAL CORONARY ANGIOPLASTY,( 12-10-2001) & (01-04-2004) CHOLECYSTECTOMY (11-26-2002)  Family History: Reviewed history from 06/25/2008 and no changes required. Dad died @69  MI Mom died @64  CVA, DM, ?HTN All mom's family died of strokes 2 brothers --1 died of suicide 3 sisters--1 died of gall bladder cancer  Social History: Reviewed history from 06/25/2008 and no changes required. 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   Skin:  multiple red papules on trunk and back mostly no ulcerations  pilonidal cyst is healed   Impression & Recommendations:  Problem # 1:  CUTANEOUS ERUPTIONS, DRUG-INDUCED (ICD-693.0) Assessment New clearly seems to be sulfa allergy he stopped this  will add to allergy visit  Complete Medication List: 1)  Metoprolol Tartrate 50 Mg Tabs (Metoprolol tartrate) .... Take 1/2 tablet by mouth two times a day 2)  Crestor 20 Mg Tabs (Rosuvastatin calcium) .... Take 1 tablet by mouth once a day 3)  Dilt-cd 240 Mg Xr24h-cap (Diltiazem hcl coated beads) .Marland Kitchen.. 1 daily by mouth 4)  Bayer Aspirin Ec Low Dose 81 Mg Tbec (Aspirin) .Marland Kitchen.. 1daily by mouth  Patient Instructions: 1)  Please schedule a follow-up appointment in 6 months for physical   Orders Added: 1)  Est. Patient Level III [16109]    Current Allergies (reviewed today): SULFAMETHOXAZOLE-TMP DS (SULFAMETHOXAZOLE-TRIMETHOPRIM)

## 2010-10-20 NOTE — Op Note (Signed)
  NAME:  [name], [name] NO.:  [id number]  MEDICAL RECORD NO.:  [id number]           PATIENT TYPE:  LOCATION:                                 FACILITY:  PHYSICIAN:  Abigail Miyamoto, M.D.      DATE OF BIRTH:  DATE OF PROCEDURE:  10/12/2010 DATE OF DISCHARGE:                              OPERATIVE REPORT   PREOPERATIVE DIAGNOSIS:  Chronic pilonidal cyst.  POSTOPERATIVE DIAGNOSIS:  Chronic pilonidal cyst.  PROCEDURE:  Excision of chronic pilonidal cyst.  SURGEON:  Abigail Miyamoto, MD  ANESTHESIA:  General and 0.5% Marcaine.  ESTIMATED BLOOD LOSS:  Minimal.  PROCEDURE IN DETAIL:  The patient was brought to the operative room, identified as Timothy Eaton.  General anesthesia was induced on the hospital stretcher.  The patient was then placed in a prone position on the operating room table.  I then taped his buttocks apart.  His gluteal cleft area was then prepped and draped in usual sterile fashion.  I then did a wide elliptical incision with a scalpel going all the way down to the sacrum in the area of the gluteal cleft that had the chronic sinus tracts.  I then completely excised the tissue with the electrocautery. The tissue was then sent to Pathology for evaluation.  I mobilized the skin slightly.  I then placed a quarter-inch Penrose drain into the wound and sutured in place with a nylon suture.  I then closed the subcutaneous tissue with interrupted 2-0 Vicryl sutures and closed the skin with interrupted 2-0 nylon sutures.  Gauze and tape were then applied.  The patient tolerated the procedure well.  All counts were correct at the end of the procedure.  The patient was then extubated in the operating room and taken in a stable condition to the recovery room.     Abigail Miyamoto, M.D.     DB/MEDQ  D:  10/12/2010  T:  10/12/2010  Job:  [id number]  Electronically Signed by Abigail Miyamoto M.D. on 10/20/2010 08:45:39 AM

## 2010-10-24 NOTE — Letter (Signed)
Summary: Dr. Magnus Ivan Note  Dr. Magnus Ivan Note   Imported By: Kassie Mends 10/16/2010 08:32:14  _____________________________________________________________________  External Attachment:    Type:   Image     Comment:   External Document  Appended Document: Dr. Magnus Ivan Note planning surgical excision of pilonidal cyst due to recurrent problems

## 2010-12-19 NOTE — Assessment & Plan Note (Signed)
[facility]                            CARDIOLOGY OFFICE NOTE   NAME:Timothy Eaton, Timothy Eaton                        MRN:          [id number]  DATE:04/08/2008                            DOB:          October 31, 1940    Timothy Eaton has known coronary artery disease.  I saw him last in November  2006.  He is not having any chest pain.  He does have shortness of  breath.  Fortunately, he stopped smoking 5 years ago.  He does have some  known COPD.  He is not having PND or orthopnea.  There is no chest pain.  His activity level is such that he does mow his yard.  Otherwise, he is  not overly active.  Historically, he had nonobstructive coronary artery  disease in 2003 and he had a repeat cath in 2005, again showing  nonobstructive disease, although it was moderate.   PAST MEDICAL HISTORY:   ALLERGIES:  No known drug allergies.   MEDICATIONS:  1. Aspirin 81.  2. Metoprolol 25 b.i.d.  3. Diltiazem 240 once daily.  4. Crestor 20.   OTHER MEDICAL PROBLEMS:  See the list below.   REVIEW OF SYSTEMS:  She had some mild dizziness.  Otherwise, his review  his review of systems is negative other than the HPI.   PHYSICAL EXAMINATION:  VITAL SIGNS:  Weight is 197 pounds.  This is down  11 pounds since 2006.  Blood pressure is 136/90 with a pulse of 71.  GENERAL:  The patient is oriented to person, time and place.  Affect is  normal.  HEENT:  No xanthelasma.  He has normal extraocular motion.  NECK:  There are no carotid bruits.  There is no jugular venous  distention.  LUNGS:  Clear.  Respiratory effort is not labored.  CARDIAC:  An S1 with an S2.  There are no clicks or significant murmurs.  ABDOMEN:  Soft.  He has no peripheral edema.   EKG reveals no significant abnormality.   PROBLEMS:  1. Moderate nonobstructive coronary artery disease with his last cath      in 2005.  2. Current exertional shortness of breath.  We need to be sure that      this is not cardiac in  origin.  We need to assess his left      ventricular and valvular function fully and proceed with exercise      testing and this is all being scheduled.  3. History of good left ventricular function.  4. Status post abdominal aortic aneurysm repair.  Overtime, we will      consider whether followup Doppler should be done.  5. Chronic obstructive pulmonary disease with some mild lung nodules      in the past that had been followed by pulmonary in the past.  6. Dyslipidemia on medications.  7. History of atrial flutter in a postoperative state in the past, but      no recurrence.  8. History of cyst on his buttocks that was treated.  9. History of hemorrhoids, treated.   We need  further testing to assess the basis of his shortness of breath.  This will be done and I will be in touch with him with the results.     Luis Abed, MD, [facility]  Electronically Signed    JDK/MedQ  DD: 04/08/2008  DT: 04/09/2008  Job #: [id number]   cc:   Karie Schwalbe, MD

## 2010-12-22 NOTE — Op Note (Signed)
NAME:  Timothy Eaton, Timothy Eaton                           ACCOUNT NO.:  [id number]   MEDICAL RECORD NO.:  [id number]                   PATIENT TYPE:  INP   LOCATION:  2303                                 FACILITY:  [facility]   PHYSICIAN:  Di Kindle. Edilia Bo, M.D.        DATE OF BIRTH:  02/29/1941   DATE OF PROCEDURE:  04/06/2004  DATE OF DISCHARGE:                                 OPERATIVE REPORT   PREOPERATIVE DIAGNOSIS:  Abdominal aortic aneurysm.   POSTOPERATIVE DIAGNOSIS:  Abdominal aortic aneurysm.   PROCEDURE:  Repair of abdominal aortic aneurysm with aorto-bi-iliac graft  (14 by 7).   SURGEON:  Di Kindle. Edilia Bo, M.D.   ASSISTANT:  Pecola Leisure, PA   ANESTHESIA:  General.   INDICATIONS FOR PROCEDURE:  This is a 70 year old gentleman who I have been  following with a small abdominal aortic aneurysm.  This enlarged to greater  than 5 cm and elective repair was recommended given the 70% per year risk of  rupture without repair.  Given his young age and the fact that he had  significant tortuosity, endovascular repair was not recommended.  The  procedure and potential complications were discussed with the patient  preoperatively.  All his questions were answered and he was agreeable to  proceed.   SURGICAL TECHNIQUE:  The patient was taken to the operating room after a  Swan-Ganz catheter and arterial line were placed by anesthesia.  The  abdomen, groins, and thighs were prepped and draped in the usual sterile  fashion.  The abdomen was entered through a midline incision.  Upon careful  exploration, no intra-abdominal pathology was noted besides the aneurysm.  The gallbladder had previously been removed laparoscopically.  The  transverse colon was reflected superiorly and the small bowel reflected to  the right.  The retroperitoneal tissue was divided to the level of the renal  vein and the neck of the aneurysm below the renal arteries was dissected  free circumferentially.   The IMA was divided between 2-0 silk ties.  The  bifurcation was exposed trying to preserve the sympathetic nerves overlying  the proximal left common iliac artery.  The common iliac arteries were  controlled circumferentially with blue loops.  The patient was heparinized  and received 25 grams Mannitol.  Clamps were placed on the common iliac  arteries first and then on the infrarenal aorta.  The aneurysm was opened  T'd off proximally and distally.  The lumbars were oversewn with 2-0 silk  ties.  There was some lymphatic leakage posterior to the neck which was  repaired with a 5-0 Prolene suture.  Next, the 14 by 7 graft was cut to the  appropriate length and it was sewn end-to-end to the infrarenal aorta using  a felt cuff with continuous 3-0 Prolene suture.  The proximal anastomosis  was tested and was hemostatic.  The aorta was reclamped and the graft  irrigated with heparinized saline.  The attention was turned to the right  iliac anastomosis.  The graft was cut to the appropriate length, slightly  spatulated, and sewn end-to-end to the right common iliac artery using  continuous 5-0 Prolene suture.  Prior to completing this anastomosis, the  artery was back bled and flushed appropriately, and the anastomosis  completed.  Flow was re-established to the right leg which the patient  tolerated from a hemodynamic standpoint.  On the left side, the left limb of  the graft was cut to the appropriate length, slightly spatulated, and sewn  end-to-end to the left common iliac artery using continuous 5-0 Prolene  suture.  At the completion, the patient had good femoral pulses.  Hemostasis  was obtained in the wounds and the heparin was partially reversed with  protamine.  The small bowel was then run and then the abdominal contents  were returned to their normal position after the aneurysm sac had been  closed with a running 2-0 Vicryl suture and the retroperitoneal tissue  closed with running  2-0 Vicryl.  The fascia was closed with running #1 PDS  sutures.  The subcutaneous layer was closed with running 3-0 Vicryl and the  skin was closed with staples.  A sterile dressing was applied.  The patient  tolerated the procedure well and was transferred to the recovery room in  satisfactory condition.  All needle and sponge counts were correct.                                               Di Kindle. Edilia Bo, M.D.    CSD/MEDQ  D:  04/06/2004  T:  04/06/2004  Job:  [id number]   cc:   Willa Rough, M.D.

## 2010-12-22 NOTE — Cardiovascular Report (Signed)
[place]  Patient:    Timothy Eaton, Timothy Eaton Visit Number: [id number] MRN: [id number]          Service Type: CAT Location: [facility] 2899 [id number] Attending Physician:  Lenoria Farrier Dictated by:   Everardo Beals Juanda Chance, M.D. [facility] Proc. Date: 12/10/01 Admit Date:  12/10/2001 Discharge Date: 12/10/2001   CC:         Tillman Abide, M.D.  Luis Abed, M.D. 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  He is a smoker.  He has developed symptoms of shortness of breath with exertion over the last two to three months.  He had a Cardiolite scan performed, which suggested inferior scar but no reversibility. Dr. Myrtis Ser saw him in consultation and recommended evaluation with angiography.  DESCRIPTION OF PROCEDURE:  The procedure was performed by the right femoral artery with arterial sheath and 6-French preformed coronary catheters.  A front wall arterial puncture was performed and Omnipaque contrast was used. Distal aortogram was performed to rule out abdominal aortic aneurysm.  At the completion of the diagnostic study we performed IVUS as part of the asteroid protocol.  The patient was given 50 units/kg of heparin to prolong the ACT to greater than 200 sec.  We used a 6-French JL4 guiding catheter and an Atlantis ultrasound catheter.  We used a short floppy wire and passed the wire down into the distal LAD without problems.  We passed the IVUS catheter into the mid LAD past the diagonal branch, and did automatic pullback.  Nitroglycerin was given prior to this.  Repeat diagnostic study was then performed through the guiding catheter.  We closed the right femoral artery with Perclose at the end of the procedure.  The patient tolerated the procedure well and left the laboratory in satisfactory condition.  RESULTS: 1. LEFT MAIN CORONARY ARTERY:  Free  of significant disease. 2. LEFT ANTERIOR DESCENDING ARTERY:  Gave rise to a septal perforator,    diagonal branch and a second septal perforator.  There was calcium in    the proximal vessel, and there were tandem 30% stenoses in the    proximal vessel before the diagonal branch. 3. CIRCUMFLEX ARTERY:  Gave rise to an intermediate branch, a marginal    branch, and two posterolateral branches.  There was 50-70% stenosis in    the mid circumflex artery.  There was 80% stenosis in the second    posterolateral branch; which was ostial in location to the AV branch. 4. RIGHT CORONARY ARTERY:  Was a moderated sized vessel that gave rise to a    right ventricular branch, a posterior descending branch, two posterolateral    branches.  There was 30% proximal and 30-40% stenoses in the mid vessel.    There was 30% stenosis in the distal vessel before the posterior descending    branch, and there was 50% stenosis before the last posterolateral    branch.  LEFT VENTRICULOGRAM:  Performed in the RAO projection, showed good wall motion and no areas of hypokinesis.  The estimated ejection fraction was 60%.  DISTAL AORTOGRAM:  Showed patent renal arteries.  There was well localized aneurysm just above the bifurcation of the origin to the iliac.  There was no major aortoiliac obstruction.  IVUS MEASUREMENTS:  Showed moderate plaque in the proximal LAD, with plaque filling approximately 50% of the vessel lumen; but with a  large residual lumen remaining due to remodeling.  CONCLUSIONS: 1. Nonobstructive coronary artery disease.  With 30% narrowing in the proximal    LAD, 50-70% stenosis in the mid circumflex artery, with 80% stenosis in a    small posterolateral branch; 30% proximal, 40% mid and 50% distal stenosis    in the right coronary artery.  Normal left ventricular function. 2. Intravascular ultrasound performed as part of the asteroid trial. 3. Distal abdominal aortic  aneurysm.  RECOMMENDATIONS:  The patient will be discharged today as an outpatient.  Will plan to put him on study drug as part of the asteroid protocol.  Will arrange for him to have an ultrasound to size the abdominal aortic aneurysm and to follow-up with Dr. Myrtis Ser after that. Dictated by:   Everardo Beals Juanda Chance, M.D. [facility] Attending Physician:  Lenoria Farrier DD:  12/10/01 TD:  12/11/01 Job: [id number]

## 2010-12-22 NOTE — H&P (Signed)
NAME:  URIJAH, ARKO                           ACCOUNT NO.:  [id number]   MEDICAL RECORD NO.:  [id number]                   PATIENT TYPE:  INP   LOCATION:                                       FACILITY:  [facility]   PHYSICIAN:  Di Kindle. Edilia Bo, M.D.        DATE OF BIRTH:  1941-05-13   DATE OF ADMISSION:  04/06/2004  DATE OF DISCHARGE:                                HISTORY & PHYSICAL   CARDIOLOGIST:  Willa Rough, M.D.   CHIEF COMPLAINT:  Abdominal aortic aneurysm.   HISTORY OF PRESENT ILLNESS:  This is a 70 year old male referred by Dr. Myrtis Ser  for evaluation of AAA.  The patient has been followed by Dr. Edilia Bo for his  known AAA since March 2004.  An original CT scan revealed a 4.8 cm abdominal  aortic aneurysm.  This has been monitored with serial abdominal CT scans.  On followup appointment on March 08, 2004, the patient's had enlarged to  approximately 5 cm or larger.  The actual size of the aneurysm is difficult  to assess secondary to tortuosity.  Due to the fact that the aneurysm has  enlarged to greater than 5 cm, Dr. Edilia Bo recommended an open elective  repair.  Cardiac clearance was obtained from Dr. Myrtis Ser.  The patient denies  abdominal pain, nausea, vomiting, constipation, hematochezia, hematemesis,  claudication symptoms, peripheral edema, hematuria, GERD symptoms, shortness  of breath, chest pain, and arrhythmias.  He does experience occasional back  pain and has hesitancy with urination. The patient presented to the CVTS  office today for physical exam prior to surgery.   PAST MEDICAL HISTORY:  1.  Abdominal aortic aneurysm.  2.  Hypertension.  3.  History of cholelithiasis.   PAST SURGICAL HISTORY:  Laparoscopic cholecystectomy by Dr. Ezzard Standing in 2004.   MEDICATIONS:  1.  Restoril 40 mg daily.  2.  Metoprolol 50 mg one-half tablet b.i.d.  3.  Aspirin 81 mg daily.   ALLERGIES:  No known drug allergies.   REVIEW OF SYSTEMS:  Negative for diabetes mellitus,  kidney disease, TIA,  CVA, cardiac disease.  Please see HPI for other significant positives and  negatives.   FAMILY HISTORY:  Mother has had CVA, father with heart disease.  A sister  with gallbladder cancer.   SOCIAL HISTORY:  Married male with three children.  He is retired.  The  patient denies alcohol use and quit smoking in 2004 after smoking one to two  packs per day for approximately 35 years.   PHYSICAL EXAMINATION:  VITAL SIGNS:  Blood pressure 138/90, pulse 62,  respirations 15.  GENERAL:  This is a 70 year old white male in no acute distress.  He is  alert and oriented x3.  HEENT:  Normocephalic, atraumatic.  Pupils equal, round and reactive to  light and accommodation.  Extraocular movements are intact.  There is no  evidence of cataracts, glaucoma, or macular degeneration.  NECK:  Supple with no JVD.  No bruits, no lymphadenopathy.  CHEST:  Symmetrical inspiration.  LUNGS:  No wheezes, rhonchi or rales.  CARDIAC:  Regular rate and rhythm with no murmurs, no rubs or gallops.  ABDOMEN:  Soft, nontender, nondistended.  There are bowel sounds present in  the four quadrants.  There are no masses or bruits.  GU/RECTAL:  Deferred.  However, the patient states that he does have  ecchymosis present in the groin area from the arteriography site.  The  patient was uncomfortable with allowing me to examine this.  He was advised  if it changed any over the next day or two, to call the office and arrange  to see Dr. Edilia Bo.  EXTREMITIES:  No clubbing, cyanosis or edema.  No ulcerations.  Temperature  is warm.  PULSES:  Peripheral pulses, carotid, femoral, popliteal, and posterior  tibial are 2+ bilaterally.  NEUROLOGIC:  Nonfocal.  Gait is steady.  Deep tendon reflexes are 2+ and  muscles are 5/5.   ASSESSMENT:  Abdominal aortic aneurysm.   PLAN:  Resection and grafting of abdominal aortic aneurysm on April 06, 2004.  Dr. Edilia Bo has seen and evaluated this patient prior  to this  admission and has explained the risks and benefits of the procedure and the  patient has agreed to continue.      Pecola Leisure, PA                      Di Kindle. Edilia Bo, M.D.    AY/MEDQ  D:  04/04/2004  T:  04/04/2004  Job:  [id number]

## 2010-12-22 NOTE — Discharge Summary (Signed)
NAME:  Timothy Eaton, Timothy Eaton                           ACCOUNT NO.:  [id number]   MEDICAL RECORD NO.:  [id number]                   PATIENT TYPE:  INP   LOCATION:  2021                                 FACILITY:  [facility]   PHYSICIAN:  Di Kindle. Edilia Bo, M.D.        DATE OF BIRTH:  10/08/1940   DATE OF ADMISSION:  04/06/2004  DATE OF DISCHARGE:                                 DISCHARGE SUMMARY   ADMITTING DIAGNOSES:  Abdominal aortic aneurysm.   PAST MEDICAL HISTORY AND DISCHARGE DIAGNOSES:  1.  Abdominal aortic aneurysm status post resection and grafting with a 14 x      7 mm aortobi iliac bypass graft.  2.  Hypertension.  3.  History of cholelithiasis.  4.  Laparoscopic cholecystectomy by Dr. Ezzard Standing in 2004.   ALLERGIES:  No known drug allergies.   BRIEF HISTORY:  The patient is a 70 year old male referred by Dr. Myrtis Ser to  Dr. Edilia Bo for evaluation of an abdominal aortic aneurysm.  Patient has  been followed by Dr. Edilia Bo for his known AAA since March of 2004.  An  original CT revealed a 4.8 cm aneurysm and this has been monitored with  serial abdominal CT scans.  On a follow-up appointment in August of 2005 the  patient's AAA had enlarged to approximately 5 cm or larger.  The actual size  of the aneurysm was difficult to assess secondary to tortuosity.  Due to the  fact that the aneurysm had enlarged to greater than 5 cm, Dr. Edilia Bo  recommended an open elective repair.  Cardiac clearance was obtained from  Dr. Myrtis Ser and the patient was subsequently scheduled for surgery.   HOSPITAL COURSE:  The patient was admitted and taken to the OR on April 06, 2004 for resection and grafting of abdominal aortic aneurysm with a 14 x  7 Hemashield aortobi iliac bypass graft.  Patient tolerated the procedure  well and was hemodynamically stable immediately postoperatively.  Patient  was transferred from the OR to postanesthesia care unit in stable condition.  Patient was extubated without  complications, woke up from anesthesia  neurologically intact.  On postoperative day one the patient's pain was  adequately controlled.  His vital signs were stable with a blood pressure of  120/62 and a heart rate of 80.  Maximum temperature had been 100.6.  Patient's lungs were clear and the abdomen was without bowel sounds.  There  were palpable pedal pulses present.  Patient's cardiac status was stable and  the __________ was discontinued.  The patient's NG tube was discontinued and  he was kept n.p.o.  Patient was transferred from the SICU to unit 2000  without complications.   Patient began cardiac rehabilitation on postoperative day two.  The patient  was noted to be saturated to 84% on 4 L of O2.  The patient's vital signs  were assessed and his temperature was 97.4 with heart rate of 105,  respirations 16, blood pressure 102/64.  The patient was without complaint  and in no acute distress.  Dr. Darrick Penna ordered an arterial blood gas and a  portable chest x-ray.  When Dr. Darrick Penna assessed the patient vital signs were  stable and he was afebrile.  Chest revealed few basilar crackles.  A pO2 was  50 on 4 L of oxygen.  The patient's hypoxia was likely secondary to  pulmonary edema and the patient was given 20 mg of IV Lasix.  The patient's  bowel function was slowly returning and he was started on sips of clears.  Patient was ordered to ambulate.   Patient began cardiac rehabilitation on postoperative day two and initially  did not tolerate this well.  However, he has continued to progress  postoperatively and is increasing his distance tolerance.   The patient's bowel function has continued to return to a normal state  postoperatively.  His diet has been advanced and he is now tolerating  regular diet.  On postoperative day three the patient's heart rate was  elevated to the 160s.  He was without chest pain or dyspnea and Dr. Darrick Penna  thought that this was likely due to hypovolemia  secondary to diuresis.  The  patient was given metoprolol and albumin and his heart rate stabilized in  the 80s.  The patient was diuresed accordingly for the remainder of the  postoperative course.   The patient's heart rate has increased with ambulation.  On postoperative  day four he was noted to go into a sinus tachycardia and short run of atrial  flutter.  The patient was given IV Lopressor and his heart rate subsequently  decreased to the 160s.  The remainder of the patient's vital signs were  stable at that time.  On postoperative day five the patient was tolerating  his diet and his bowel function had returned.  The patient's lungs were  clear and the abdomen was soft with active bowel sounds and the incision was  healing well.  There were palpable pedal pulses and the patient was feeling  well.  The patient is tolerating ambulation better with no further episodes  of tachycardia.  The patient is in stable condition at this time and is felt  to be stable for discharge on the morning of postoperative day six pending  morning round reevaluation.   LABORATORIES:  CBC on April 09, 2004:  Hemoglobin 14.3, hematocrit 42.1,  white count 5.6, platelets 157.  BMP on April 11, 2004:  Sodium 143,  potassium 3.5, BUN 17, creatinine 1.1, glucose 123, calcium 9.6.   CONDITION ON DISCHARGE:  Improved.   DISCHARGE INSTRUCTIONS:   MEDICATIONS:  1.  Patient is to resume his Crestor 40 mg daily.  2.  Aspirin 81 mg daily.  3.  Metoprolol 50 mg one-half tablet b.i.d.  4.  Colace 100 mg two tablets p.o. p.r.n. constipation daily.  5.  Tylox one to two p.o. q.4-6h. p.r.n. pain.   ACTIVITY:  No driving and the patient is to continue daily breathing and  walking exercises.   DIET:  Low salt, low fat.   WOUND CARE:  Patient is to clean the incision daily with soap and water.   SPECIAL INSTRUCTIONS:  If the incision becomes red, swollen, or drainage or if the patient has a fever of 101  degrees Fahrenheit, he is to contact the  CVTS office at [phone].  Next, if the patient feels a rapid heart rate or  he becomes  short of breath with or without chest pain, the patient is to  contact his cardiologist, Dr. Myrtis Ser.   FOLLOWUP APPOINTMENT:  The patient has a staple removal appointment as well  as a follow-up appointment with Dr. Edilia Bo scheduled.  The [facility] office will  contact patient with the date and time of the appointment.      Pecola Leisure, PA                      Di Kindle. Edilia Bo, M.D.    AY/MEDQ  D:  04/11/2004  T:  04/11/2004  Job:  [id number]   cc:   Willa Rough, M.D.

## 2010-12-22 NOTE — Consult Note (Signed)
NAME:  Timothy Eaton, Timothy Eaton                           ACCOUNT NO.:  [id number]   MEDICAL RECORD NO.:  [id number]                   PATIENT TYPE:  INP   LOCATION:  2021                                 FACILITY:  [facility]   PHYSICIAN:  Charlton Haws, M.D.                  DATE OF BIRTH:  1941-02-05   DATE OF CONSULTATION:  04/11/2004  DATE OF DISCHARGE:                                   CONSULTATION   PHYSICIANS:  Primary care physician:  Dr. Alphonsus Sias.  Primary cardiologist:  Willa Rough, M.D.  Referring physician:  Di Kindle. Edilia Bo, M.D.   SUMMARY OF HISTORY:  Timothy Eaton is a 70 year old white male who was admitted  by Dr. Edilia Bo on April 06, 2004, for AAA resection.  [facility] has been  following his abdominal aortic aneurysm since March 2004, and with its  increasing size, his admission and resection was scheduled.  He is  asymptomatic prior to surgery in regards to the AAA.  We were asked to  consult today secondary to postoperative atrial flutter with increased  ventricular rate.   The patient states he has done considerably well postoperatively.  He has  had some nausea but feels that this is normal.  He does describe three  episodes of not feeling right since surgery.  He is not specific as to  when the first episode was, but the second episode yesterday occurred while  he was walking.  He felt lightheaded, and then he felt that he just wasn't  right.  According to the patient, the nurse stated that his heart rate was  going fast. Then today at approximately 1920 while getting up to sit on the  side of the bed, he had a similar feeling of not feeling right.  With  today's episode, telemetry also showed atrial flutter with a rapid rate.  The patient denies any prior history of these sensations before his  admission.  He denies any history of palpitations or syncope.  He has not  had any problems with chest discomfort, shortness of breath, or  nitroglycerin use, orthopnea, PND, edema,  wheezing, our coughing prior to  admission.   In reviewing his hospital course, nursing notes of September 5 at 1545  documented patient's ventricular rate of approximately 170s.  Telemetry  showed atrial flutter.  They received instructions for 5 mg IV Lopressor.   ALLERGIES:  No known drug allergies.   MEDICATIONS PRIOR TO ADMISSION:  1.  Crestor 40 mg daily.  2.  Lopressor 50 mg 1/2 tablet b.i.d.  3.  Aspirin 31 mg daily.   CURRENT MEDICATIONS:  1.  Lopressor 5 mg IV q.8h.  2.  Dulcolax 10 mg suppository daily.  3.  Pepcid 20 mg b.i.d.  4.  Lasix 20 mg x 1.  5.  Lopressor 25 mg b.i.d.  6.  Restoril 30 mg q.h.s.   PAST MEDICAL HISTORY:  1.  Hypertension.  2.  Multiple pulmonary nodules associated with COPD.  He is followed by Dr.      Sherene Sires for this and obtains regular CTs.  The next one is due in December      2005.  3.  He did have some spirometry testing performed back in May. This was very      difficult for the patient.  His vital capacity was 54% of predicted, and      his FEV1 was only 16% of predicted values.  4.  History of hyperlipidemia.  5.  Coronary artery disease first suspicion in 2003 when stress Cardiolite      was abnormal for an inferior scar.  He underwent catheterization on Dec 10, 2001, which showed a 30 to 35% LAD, 50 to 70% mid circumflex, 80%      posterolateral-2 branch, 30 to 40% mid RCA, 30% distal RCA, 50% distal      RCA.  EF 60%.  He was placed in the ASTEROID study, and subsequent      catheterization on May 31 essentially did not show any changes. EF was      55% without any wall motion abnormalities.  It was recommended he      continue medical treatment.   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  He  quit smoking in 2004.  Prior to that, approximately two packs per day for 35  years.  He stated that he used to drink like a fish except that he quit  drinking alcohol approximately 15 years ago.  He did  not elaborate.  He  denies any drug usage.   FAMILY HISTORY:  His mother died at age 76 with CVA; father at 59 with an  MI.   REVIEW OF SYSTEMS:  Notable as mentioned above in addition to occasional  headaches and postop nausea.   PHYSICAL EXAMINATION:  GENERAL:  Well-developed, well-nourished, pleasant  white male in no apparent distress.  VITAL SIGNS:  Temperature 97.5, blood pressure 134/66, pulse 90,  respirations 20, O2 saturation 95% on room air.  HEENT:  Essentially unremarkable.  NECK:  Supple without thyromegaly, adenopathy, JVD, or carotid bruits.  HEART:  At this time, regular rate and rhythm without murmurs, rubs, clicks,  or gallops.  LUNGS:  Decreased breath sounds with some coarse breath sounds but without  rales, rhonchi, or wheeze.  SKIN:  Integument is intact except for abdominal incision.  ABDOMEN:  Bowel sounds present.  Abdominal bruits were not appreciated.  Palpation was not performed.  EXTREMITIES:  No cyanosis, clubbing, or edema.  Peripheral pulses are  symmetric and intact.  MUSCULOSKELETAL/NEUROLOGIC:  Unremarkable.   LABORATORY AND X-RAY DATA:  Preadmission chest x-ray shows COPD with  bibasilar scarring.   Hemoglobin 15.3, hematocrit 45.1, normal indices, platelets 224, WBC 7.7.  Sodium 137, potassium 4.3, BUN 15, creatinine 0.4, glucose 86.  PTT 30, PT  13.2, INR 1.0.   Preadmission EKG shows normal sinus rhythm, some baseline artifact which  makes it difficult to interpret some of his interval nonspecific ST-T wave  change.  Subsequent EKGs on September 4 and today at approximately 5:20 show  atrial flutter with ventricular rates 160s and 170s, normal axis,  nonspecific ST-T wave changes, no signs of ischemic changes.   IMPRESSION:  1.  Status post abdominal aortic aneurysm resection, postoperative day #5.  2.  Postoperative anemia which has resolved.  3.  Postoperative hypokalemia which has resolved.  4.  Postoperative nausea, improved. 5.   Postoperative atrial flutter with increased ventricular rate; according      to documentation available, vital signs were stable.   History is per Past Medical History.   PLAN:  Dr. Eden Emms has reviewed the patient's history, spoke with and  examined the patient and agrees with above.  We will check a TSH to assure  he is not having any problems with a thyroid disorder.  We agree with IV  Cardizem and will probably change this to p.o. in the morning.  If he has  further episodes, should consider amiodarone treatment.  He does not need  any long-term anticoagulation at this time; however, when surgeons permit,  please resume aspirin and Crestor as he was taking prior to admission.  He  will need postop followup with Dr. Willa Rough.     Joellyn Rued, P.A. [facility]                    Charlton Haws, M.D.    EW/MEDQ  D:  04/11/2004  T:  04/11/2004  Job:  [id number]   cc:   Willa Rough, M.D.   Di Kindle. Edilia Bo, M.D.  [address]  [place]  [place] [address]   Dr. Alphonsus Sias

## 2010-12-22 NOTE — Discharge Summary (Signed)
NAME:  Timothy Eaton, Timothy Eaton                           ACCOUNT NO.:  [id number]   MEDICAL RECORD NO.:  [id number]                   PATIENT TYPE:  INP   LOCATION:  2021                                 FACILITY:  [facility]   PHYSICIAN:  Di Kindle. Edilia Bo, M.D.        DATE OF BIRTH:  04-Apr-1941   DATE OF ADMISSION:  04/06/2004  DATE OF DISCHARGE:  04/13/2004                                 DISCHARGE SUMMARY   ADDENDUM:   ADDITIONAL DISCHARGE DIAGNOSES:  Atrial flutter/atrial fibrillation.   Mr. Sheils was originally scheduled for discharge home on April 12, 2004.  However, on the evening of April 11, 2004, he developed atrial flutter  and atrial fibrillation with rapid rate.  He was symptomatic with  lightheadedness.  He had previously been treated during this admission with  IV Lopressor for a brief episode of atrial flutter and good conversion  without further arrhythmias.  Because of this recurrence and his previous  episode which was documented, he was started on IV Cardizem and a cardiology  consultation was obtained.  The patient was seen by Dr. Myrtis Ser and recommended  that he continue the IV Cardizem and consider amiodarone treatment if he was  not able to convert to sinus rhythm.  Fortunately he did convert to sinus  rhythm on the Cardizem drip.  After a 24-hour IV course, he was switched to  Cardizem CD 240 mg p.o. daily.  He otherwise remained stable.  By April 13, 2004, he had remained in normal sinus rhythm for greater than 24 hours.  He was afebrile and all other vital signs were stable.  He was tolerating a  regular diet without difficulty and was having normal bowel and bladder  function.  From a post surgical standpoint, he had remained stable and was  felt to be ready for discharge home.  He was also seen by cardiology and was  cleared to go home from their standpoint as well.   DISCHARGE MEDICATIONS:  In addition to the previous dictated discharge  medications, he  will go home on Cardizem CD 240 mg p.o. daily.   DISCHARGE INSTRUCTIONS:  In addition to the previously dictated discharge  instructions, he will be scheduled for follow-up with Dr. Myrtis Ser in two weeks  for reevaluation.  He is asked to contact cardiologist's office immediately  if he experiences any further lightheadedness, palpitations or shortness of  breath symptoms.  The remainder of his discharge instructions from surgical  standpoint are unchanged from the previously dictated discharge summary.  He  will follow up as scheduled with Dr. Edilia Bo.      Coral Ceo, P.A.                        Di Kindle. Edilia Bo, M.D.    GC/MEDQ  D:  04/13/2004  T:  04/14/2004  Job:  [id number]   cc:   Willa Rough, M.D.

## 2010-12-22 NOTE — Op Note (Signed)
NAME:  Timothy Eaton, Timothy Eaton NO.:  [id number]   MEDICAL RECORD NO.:  [id number]                   PATIENT TYPE:  OIB   LOCATION:  5731                                 FACILITY:  [facility]   PHYSICIAN:  Sandria Bales. Ezzard Standing, M.D.               DATE OF BIRTH:  09/27/1940   DATE OF PROCEDURE:  11/26/2002  DATE OF DISCHARGE:                                 OPERATIVE REPORT   PREOPERATIVE DIAGNOSIS:  Chronic cholelithiasis and cholecystitis.   POSTOPERATIVE DIAGNOSIS:  Chronic cholelithiasis and cholecystitis.   OPERATION/PROCEDURE:  Laparoscopic cholecystectomy and intraoperative  cholangiogram.   SURGEON:  Christopher E. Ezzard Standing, M.D.   FIRST ASSISTANT:  Ollen Gross. Carolynne Edouard, M.D.   ANESTHESIA:  General endotracheal tube.   ESTIMATED BLOOD LOSS:  Minimal.   INDICATIONS FOR PROCEDURE:  Mr. Agostino is a 70 year old white male who was  seen in the emergency room for symptomatic cholelithiasis.  He has also been  noted to have an abdominal aortic aneurysm by CT scan.  He has been followed  by Dr. Riley Kill and Dr. Edilia Bo and due to the findings they observed this.  Discussion carried out with the operation for his gallbladder surgery.  Risks discussed included, but not limited to, bleeding, infection, bowel  injury, bile duct injury, open surgery.  The patient understands the planned  procedure and will go ahead with laparoscopic cholecystectomy.   DESCRIPTION OF PROCEDURE:  The patient was placed in the supine position.  He was given general endotracheal anesthesia supervised by Dr. Diamantina Monks.  He was given 1 g of Ancef during the procedure.  His abdomen was shaved,  prepped with Betadine solution and sterilely draped with Infra.  Umbilical  incision was made and carried down to the abdominal cavity.  A 0 degree  laparoscope was inserted through a 12 mm Hasson trocar.  The Hasson trocar  was seated with a 0 Vicryl suture.  The liver, both lobes were unremarkable.  Anterior  wall of the stomach was unremarkable.  I could see his pelvis.  There were no hernias.  His lower GI tract was unremarkable what I could  see.  Three additional trocars were placed at 10 mm subxiphoid trocar placed  5 mm right mid subcostal trocar and a right 5 mm lateral subcostal trocar.  The gallbladder was then grasped, rotated cephalad.  Dissection carried out  at the cystic duct and gallbladder junction.  There was a fair amount of  fibrous tissue in this area, identified the cyst duct.  The cystic artery  was doubly endo clipped, actually one anterior and one posterior branch of  the cystic artery.  A clip was placed in the gallbladder side of the cystic  duct and intraoperative cholangiogram was obtained.  The intraoperative cholangiogram was obtained with a cut-off Taut catheter  inserted through a 14-gauge Jelco catheter and secured with an endo clamp.  The intraoperative cholangiogram was then shot with half-strength Hypaque  solution showing the somewhat enlarged common bile duct and free flow down  the common bile duct into the duodenum.  There was no filling defect, no  mass and the hepatic radicals filled.  This showed to be a normal intraoperative cholangiogram.  The Taut catheter  was then removed, the cystic duct triple endo clipped and divided a few more  vessels which I clipped in getting the gallbladder out.  I then visualized  well the triangle of Calot and dissected the gallbladder off the gallbladder  bed using primary hoop Bovie coagulation prior to complete division of the  gallbladder from the gallbladder bed, visualized again the triangle of Calot  and visualized again the gallbladder bed. There was no bleeding or bile  leak.   The gallbladder was then divided from the liver, placed in the EndoCatch bag  and delivered through the umbilicus.  I then irrigated out the abdomen with  about two liters of saline.  The umbilical trocar was closed with 0 Vicryl   suture.  I removed the trocars from the other trocar sites.  The skin was  then closed with a 5-0 Vicryl suture, painted with tincture of Benzoin and  Steri-Strips.  The patient tolerated the procedure well and was transported to the recovery  room in good condition.  Sponge and needle count were correct at the end of  the case.                                                 Sandria Bales. Ezzard Standing, M.D.    DHN/MEDQ  D:  11/26/2002  T:  11/26/2002  Job:  [id number]   cc:   Joanna Hews, M.D.   Arturo Morton. Riley Kill, M.D.   Di Kindle. Edilia Bo, M.D.  [address]  [place]  [place] [address]  Fax: [phone]

## 2010-12-22 NOTE — Cardiovascular Report (Signed)
NAME:  BAYLEY, HURN NO.:  [id number]   MEDICAL RECORD NO.:  [id number]                   PATIENT TYPE:  OIB   LOCATION:  2853                                 FACILITY:  [facility]   PHYSICIAN:  Charlies Constable, M.D. [facility]              DATE OF BIRTH:  [birth year]   DATE OF PROCEDURE:  01/04/2004  DATE OF DISCHARGE:                              CARDIAC CATHETERIZATION   CLINICAL HISTORY:  Mr. 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  He was studied two years ago and found to have  nonobstructive coronary disease and was enrolled in the asteroid trial and  underwent IVUS examination of his LAD at that time.  He was treated with  open label Crestor 40 mg and returns today for a followup cath and IVUS.   PROCEDURE:  The procedure was performed via the right femoral artery using  an arterial sheath and 6 French preformed coronary catheters.  A __________  two vessels were performed and Omnipaque contrast was used.  We used a 6  [place] guiding catheter and a soft Asahi wire and Atlantis IVUS catheter  for the IVUS run.  Nitroglycerin was given prior to the run.  We passed the  IVUS catheter distal to the first large diagonal branch and did automatic  pullback over about 60 mm.  The patient tolerated the procedure well.  The  left femoral artery was not suitable for closure.  He left the laboratory in  stable condition.   RESULTS:  The left main coronary artery was free of significant disease.   The left anterior descending artery had a 30% narrowing in the proximal  portion before the first large diagonal branch.  There were irregularities  in the mid LAD but no major obstruction.   Circumflex coronary.  The circumflex artery gave rise to a ramus branch, an  atrial branch, a small marginal branch and two posterolateral branches.  There was 70% narrowing in the mid circumflex artery.  There was 80%  narrowing in a small  posterolateral branch which was ostial to the AV  branch.   Right coronary artery.  The right coronary artery is a moderate sized vessel  which gave rise to a right ventricular  branch, posterior descending branch,  and three small posterolateral branches.  There was 30% proximal, 40% mid  and 50% distal stenosis in the right coronary artery.  This had not changed  significantly from before.   LEFT VENTRICULOGRAM:  Left ventriculogram performed in the RAO projection  showed good wall motion with no areas of hypokinesis.  The estimated  ejection fraction was 55%.   The aortic pressure was 120/76 with a mean of 96, left ventricular pressure  was 120/14.   CONCLUSION:  1. Nonobstructive coronary artery disease with 30% narrowing in the proximal     LAD, 70%  narrowing in the mid circumflex artery with 80% narrowing in the     small posterolateral branch, 40% mid and 50% distal narrowing in the     right coronary artery with normal LV function.  2. IVUS examination of the left anterior descending as part of the asteroid     trial followup.   RECOMMENDATION:  The patient has quite a bit of nonobstructive coronary  disease but it does not appear to have changed much over the last two years.  His IVUS examination showed quite a bit of plaque in the proximal LAD but  the lumen was good in all areas.  Will plan continued secondary risk factor  modification, let him go home as an outpatient today.                                               Charlies Constable, M.D. [facility]    BB/MEDQ  D:  01/04/2004  T:  01/04/2004  Job:  [id number]   cc:   Willa Rough, M.D.   Cardiopulmonary

## 2010-12-22 NOTE — Consult Note (Signed)
NAME:  Timothy Eaton, COSTER NO.:  [id number]   MEDICAL RECORD NO.:  [id number]                   PATIENT TYPE:  EMS   LOCATION:  MAJO                                 FACILITY:  [facility]   PHYSICIAN:  Sandria Bales. Ezzard Standing, M.D.               DATE OF BIRTH:  1941-05-06   DATE OF CONSULTATION:  10/05/2002  DATE OF DISCHARGE:                                   CONSULTATION   HISTORY OF PRESENT ILLNESS:  The patient is a pleasant 70 year old white  male who awoke early this morning with back pain and epigastric discomfort.  He denies chronic GI symptoms.  He has had no history of peptic ulcer  disease, liver disease, gallbladder disease, pancreatic disease, colon  disease.  He does have a sister who in her 30s had gallbladder cancer.   He presented to the [facility] Emergency Room.  He had a known abdominal  aortic aneurysm apparently diagnosed about 10 months ago through [facility]  [facility].  A CT scan was obtained.  The CT scan reveals at least three  separate findings.   First, he does have an abdominal aortic aneurysm which is 4.8 cm but is not  leaking.  The patient, however, thinks this is larger than what the aneurysm  looked like 10 months ago, though we do not have any data on that.  Secondly, he has a small 6mm nodule in his left upper lobe which is too  small to characterize.  And thirdly, he had about a 2.5 cm gallstone but no  stigmata of acute cholecystitis.   ALLERGIES:  No know allergies.   CURRENT MEDICATIONS:  1. Metoprolol.  2. Aspirin 325 mg daily.  3. Rous-Vastin, which is an experimental drug through [facility].   REVIEW OF SYSTEMS:  NEUROLOGIC:  No history of seizure or loss of  consciousness.  PULMONARY:  He smokes cigarettes and knows this is bad for  his health, also knows it is time to quit.  CARDIAC:  He was evaluated for  chest pain, possible MI.  Underwent treadmill in May 2003 by [facility], and the name he  remembers is Dr. Juanda Chance.  He had a heart  catheterization which showed an 80% stenosis of one of his coronary  arteries, though he is not sure which one.  He did not undergo a stent.  He  has worked on a diet, and he had a known aortic aneurysm.  He was up to see  them in May 2004.  GASTROINTESTINAL:  See History of Present Illness.  UROLOGIC:  No history of kidney stones or kidney infections.   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  He is accompanied by his wife in the emergency room.   PHYSICAL EXAMINATION:  VITAL SIGNS:  Temperature 97.0, blood pressure  103/68,  pulse 78, respirations 16.  GENERAL:  He is a well-nourished, pleasant white male, alert and cooperative  on physical exam.  HEENT:  Unremarkable.  He is not jaundiced.  His pupils are equal and  reactive to light.  His mouth shows no obvious oral lesions.  NECK:  Supple without mass or thyromegaly.  LUNGS:  Full to auscultation with symmetric breath sounds.  HEART:  Regular rate and rhythm.  He has no murmur and no rub that I can  feel.  ABDOMEN:  Soft.  He has no organomegaly.  I cannot palpate his liver.  He  has no tenderness, no guarding, no rebound or inguinal hernia.  GENITALIA:  Both testicles are descended.  EXTREMITIES:  Normal four extremities.  NEUROLOGIC:  Grossly intact.   LABORATORY DATA:  Normal urinalysis. White blood count 7800, hemoglobin  15.6, hematocrit 45.  He has a mild shift with 79% neutrophils.  Sodium 141,  potassium 4.3, chloride 105, CO2 30, glucose 116, creatinine 1.0.  Alkaline  phosphatase 88, total bilirubin 0.4.  Lipase 36, amylase 147 which is mildly  elevated with high normal of 131.  CK-MB 2.9, troponin 0.01.   EKG showed a normal sinus rhythm.   He underwent a hepatobiliary scan which showed anomalies of his gallbladder.   IMPRESSION:  1. The patient has chronic cholecystitis and cholelithiasis.  He is not     presenting  with acute cholecystitis,but I think his hepatobiliary scan is     reflecting chronic disease.  He is on aspirin, has some other findings.     It would be nice for him to be seen by cardiology.  I have written down     on a sheet of paper his certain findings.  He is going to check with my     office tomorrow to schedule for elective cholecystectomy.  I discussed     with them the indications and potential complications of gallbladder     surgery.  Potential complications include but are not limited to     bleeding, infection, common bile duct injury, an open surgery.  I told     him we do have books on gallbladder surgery to pick up at our office.  2. Abdominal aortic aneurysm measuring 4.8 cm.  I wrote this information     down on a sheet of paper.  He is going to check with Dr. Orlean Bradford for     followup of this, and he may need a vascular surgery consult.  We will     leave that up to Dr Orlean Bradford at this time.  3. Pulmonary nodule which is new by history.  Will compare with old chest x-     rays, and he will check with Dr. Orlean Bradford.  4. Known coronary artery disease.  He will check with Dr. Juanda Chance to see if     anything else needs to be done preoperatively.  5. Hypertension. Stable.  6. He is on aspirin and needs to stop this one week before surgery.   Again, I gave all this information to the patient, and he will check with  our office in the morning.                                               Sandria Bales. Ezzard Standing, M.D.    DHN/MEDQ  D:  10/05/2002  T:  10/05/2002  Job:  [id number]   cc:   Dr. Arn Medal, [facility]

## 2010-12-22 NOTE — Op Note (Signed)
Timothy Eaton, Timothy NO.:  [id number]   MEDICAL RECORD NO.:  [id number]                   PATIENT TYPE:  OIB   LOCATION:  2899                                 FACILITY:  [facility]   PHYSICIAN:  Di Kindle. Edilia Bo, M.D.        DATE OF BIRTH:  [birth year]   DATE OF PROCEDURE:  03/27/2004  DATE OF DISCHARGE:                                 OPERATIVE REPORT   REFERRING PHYSICIAN:  Willa Rough, M.D.   PREOPERATIVE DIAGNOSIS:  Abdominal aortic aneurysm.   POSTOPERATIVE DIAGNOSIS:  Abdominal aortic aneurysm.   PROCEDURES:  1. Aortogram.  2. Bilateral iliac arteriogram.  3. Bilateral lower extremity runoff.   SURGEON:  Di Kindle. Edilia Bo, M.D.   ANESTHESIA:  Local with sedation.   DESCRIPTION OF PROCEDURE:  The patient was taken to the [facility] lab at [facility]  [facility] and sedated with 1 mg of Versed and 50 mcg of  Fentanyl.  The groins were prepped and draped in the usual sterile fashion.  After the skin was anesthetized with 1% lidocaine, the right common femoral  artery was cannulated and a guidewire introduced into the aorta under  fluoroscopic control.  A 5 French sheath was then passed over the wire and  then the dilator was removed.  A pigtail catheter was positioned at the L1  vertebral body.  Flush aortogram was obtained.  Bilateral projection was  then obtained.  The catheter was then repositioned above the aortic  bifurcation and oblique iliac projections were obtained.  Bilateral lower  extremity runoff films were then obtained.   FINDINGS:  There were single renal arteries bilaterally with no significant  renal artery stenosis identified.  The neck of the aneurysm is approximately  2 cm in length.  The size of the aneurysm could not be accurately determined  by this study because of laminated thrombus.  There was some mild narrowing  at the distal aorta and proximal common iliac arteries.  The common iliac,  external iliac  and hypogastric arteries were widely patent bilaterally.  The  common femoral, superficial femoral, deep femoral and popliteal arteries are  all patent bilaterally.  There is three vessel runoff bilaterally.   CONCLUSION:  Infrarenal abdominal aortic aneurysm.  The size could not  accurately be determined by this study.                                               Di Kindle. Edilia Bo, M.D.    CSD/MEDQ  D:  03/27/2004  T:  03/27/2004  Job:  [id number]   cc:   Willa Rough, M.D.

## 2011-02-20 ENCOUNTER — Encounter: Payer: Self-pay | Admitting: Cardiology

## 2011-04-02 ENCOUNTER — Ambulatory Visit (INDEPENDENT_AMBULATORY_CARE_PROVIDER_SITE_OTHER): Payer: Medicare Other | Admitting: Internal Medicine

## 2011-04-02 ENCOUNTER — Encounter: Payer: Self-pay | Admitting: Internal Medicine

## 2011-04-02 DIAGNOSIS — E785 Hyperlipidemia, unspecified: Secondary | ICD-10-CM

## 2011-04-02 DIAGNOSIS — J449 Chronic obstructive pulmonary disease, unspecified: Secondary | ICD-10-CM

## 2011-04-02 DIAGNOSIS — R918 Other nonspecific abnormal finding of lung field: Secondary | ICD-10-CM | POA: Insufficient documentation

## 2011-04-02 DIAGNOSIS — I10 Essential (primary) hypertension: Secondary | ICD-10-CM

## 2011-04-02 DIAGNOSIS — R911 Solitary pulmonary nodule: Secondary | ICD-10-CM | POA: Insufficient documentation

## 2011-04-02 DIAGNOSIS — J4489 Other specified chronic obstructive pulmonary disease: Secondary | ICD-10-CM

## 2011-04-02 NOTE — Assessment & Plan Note (Signed)
No problems with med Will check labs 

## 2011-04-02 NOTE — Assessment & Plan Note (Signed)
No sig functional problems No cough  Stopped smoking about 7 years ago No Rx

## 2011-04-02 NOTE — Progress Notes (Signed)
Subjective:    Patient ID: Timothy Eaton, male    DOB: Apr 20, 1941, 70 y.o.   MRN: [id number]  HPI Didn't do Medicare Wellness paperwork  No cough or breathing problems Has slacked off with walking since pilonidal cyst surgery Plans to restart No palpitations No change in exercise tolerance  No problems with statin No myalgias No stomach trouble  Current Outpatient Prescriptions on File Prior to Visit  Medication Sig Dispense Refill  . aspirin 81 MG EC tablet Take 81 mg by mouth daily.        Marland Kitchen diltiazem (CARDIZEM CD) 240 MG 24 hr capsule Take 240 mg by mouth daily.        . metoprolol (LOPRESSOR) 50 MG tablet Take 25 mg by mouth 2 (two) times daily.        . rosuvastatin (CRESTOR) 20 MG tablet Take 20 mg by mouth daily.          Allergies  Allergen Reactions  . Sulfamethoxazole W/Trimethoprim     REACTION: rash    Past Medical History  Diagnosis Date  . Coronary artery disease     Moderate, catheterization 2005, nuclear scan 9/09 no ischemia, EF 60%, echo  . Carotid artery disease     Minimal, doppler 2004, 0-39% bilateral, doppler 05/17/09 0-39% bilateral  . GERD (gastroesophageal reflux disease)   . Hemorrhoids   . Atrial fibrillation     Postoperative in past  . COPD (chronic obstructive pulmonary disease)   . Lung nodule     Followup by pulmonary in past  . ED (erectile dysfunction)   . Hyperlipidemia   . Hypertension     Past Surgical History  Procedure Date  . Back surgery   . Abdominal aortic aneurysm repair 04/06/04    Dr. Edilia Bo  . Coronary angioplasty 12/10/01 & 01/04/04    Percutaneous transluminal coronary angioplasty  . Cholecystectomy 11/26/02    Family History  Problem Relation Age of Onset  . Hypertension Mother     ?  . Stroke Mother     CVA  . Diabetes Mother     DM  . Heart attack Father     MI  . Stroke Other     All mom's family died of strokes  . Cancer Sister     Gall bladder     History   Social History  . Marital  Status: Married    Spouse Name: N/A    Number of Children: 3  . Years of Education: N/A   Occupational History  . Truck driver     Retired   Social History Main Topics  . Smoking status: Former Smoker    Quit date: 08/06/2002  . Smokeless tobacco: Never Used  . Alcohol Use: No  . Drug Use: No  . Sexually Active: Not on file   Other Topics Concern  . Not on file   Social History Narrative  . No narrative on file   Review of Systems Sleeps well Appetite is good Weight is stable    Objective:   Physical Exam  Constitutional: He appears well-developed and well-nourished. No distress.  Neck: Normal range of motion. Neck supple. No thyromegaly present.  Cardiovascular: Normal rate and regular rhythm.  Exam reveals no gallop.   No murmur heard.      Faint distal pulses  Pulmonary/Chest: Effort normal. No respiratory distress. He has no wheezes. He has no rales.       Decreased breath sounds at apices but clear  Abdominal: Soft. There is no tenderness.  Musculoskeletal: Normal range of motion. He exhibits no edema and no tenderness.  Lymphadenopathy:    He has no cervical adenopathy.  Psychiatric: He has a normal mood and affect. His behavior is normal. Judgment and thought content normal.          Assessment & Plan:

## 2011-04-02 NOTE — Assessment & Plan Note (Signed)
BP Readings from Last 3 Encounters:  04/02/11 124/70  10/02/10 120/70  09/20/10 126/82   Good control Due for labs

## 2011-04-02 NOTE — Assessment & Plan Note (Signed)
Due for follow up CT since there were new nodules 9/11

## 2011-04-03 LAB — BASIC METABOLIC PANEL
BUN: 19 mg/dL (ref 6–23)
CO2: 28 mEq/L (ref 19–32)
Calcium: 9.1 mg/dL (ref 8.4–10.5)
Chloride: 108 mEq/L (ref 96–112)
Creatinine, Ser: 1 mg/dL (ref 0.4–1.5)
GFR: 80.02 mL/min (ref >60.00)
Glucose, Bld: 86 mg/dL (ref 70–99)
Potassium: 4.5 mEq/L (ref 3.5–5.1)
Sodium: 143 mEq/L (ref 135–145)

## 2011-04-03 LAB — LIPID PANEL
Cholesterol: 110 mg/dL (ref 0–200)
HDL: 40.9 mg/dL (ref >39.00)
LDL Cholesterol: 54 mg/dL (ref 0–99)
Total CHOL/HDL Ratio: 3
Triglycerides: 74 mg/dL (ref 0.0–149.0)
VLDL: 14.8 mg/dL (ref 0.0–40.0)

## 2011-04-03 LAB — CBC WITH DIFFERENTIAL/PLATELET
Basophils Absolute: 0 10*3/uL (ref 0.0–0.1)
Basophils Relative: 0.5 % (ref 0.0–3.0)
Eosinophils Absolute: 0.2 10*3/uL (ref 0.0–0.7)
Eosinophils Relative: 3.2 % (ref 0.0–5.0)
HCT: 45.8 % (ref 39.0–52.0)
Hemoglobin: 15.1 g/dL (ref 13.0–17.0)
Lymphocytes Relative: 28.7 % (ref 12.0–46.0)
Lymphs Abs: 1.5 10*3/uL (ref 0.7–4.0)
MCHC: 32.8 g/dL (ref 30.0–36.0)
MCV: 92.8 fl (ref 78.0–100.0)
Monocytes Absolute: 0.5 10*3/uL (ref 0.1–1.0)
Monocytes Relative: 10 % (ref 3.0–12.0)
Neutro Abs: 3.1 10*3/uL (ref 1.4–7.7)
Neutrophils Relative %: 57.6 % (ref 43.0–77.0)
Platelets: 169 10*3/uL (ref 150.0–400.0)
RBC: 4.94 Mil/uL (ref 4.22–5.81)
RDW: 13.9 % (ref 11.5–14.6)
WBC: 5.3 10*3/uL (ref 4.5–10.5)

## 2011-04-03 LAB — TSH: TSH: 1.04 u[IU]/mL (ref 0.35–5.50)

## 2011-04-03 LAB — HEPATIC FUNCTION PANEL
Albumin: 4.1 g/dL (ref 3.5–5.2)
Alkaline Phosphatase: 88 U/L (ref 39–117)

## 2011-04-19 ENCOUNTER — Ambulatory Visit (INDEPENDENT_AMBULATORY_CARE_PROVIDER_SITE_OTHER)
Admission: RE | Admit: 2011-04-19 | Discharge: 2011-04-19 | Disposition: A | Discharge status: Home / Self Care | Payer: Medicare Other | Source: Ambulatory Visit | Attending: Internal Medicine | Admitting: Internal Medicine

## 2011-04-19 DIAGNOSIS — R918 Other nonspecific abnormal finding of lung field: Secondary | ICD-10-CM

## 2011-04-20 ENCOUNTER — Other Ambulatory Visit: Payer: Self-pay | Admitting: Cardiology

## 2011-04-25 ENCOUNTER — Telehealth: Payer: Self-pay | Admitting: *Deleted

## 2011-04-25 NOTE — Telephone Encounter (Signed)
.  left message to have patient return my call.  

## 2011-04-25 NOTE — Telephone Encounter (Signed)
Message copied by Sueanne Margarita on Wed Apr 25, 2011 12:21 PM ------      Message from: Tillman Abide I      Created: Thu Apr 19, 2011  5:31 PM       Please call      The CT scan shows the nodules are about the same except for a couple which are slightly smaller      We will discuss whether this needs to be repeated again next year---not clear that we need to but will discuss then

## 2011-04-26 NOTE — Telephone Encounter (Signed)
Spoke with patient and advised results   

## 2011-05-03 ENCOUNTER — Other Ambulatory Visit: Payer: Self-pay | Admitting: Internal Medicine

## 2011-05-03 ENCOUNTER — Other Ambulatory Visit: Payer: Medicare Other

## 2011-05-03 DIAGNOSIS — Z1211 Encounter for screening for malignant neoplasm of colon: Secondary | ICD-10-CM

## 2011-05-07 ENCOUNTER — Encounter: Payer: Self-pay | Admitting: *Deleted

## 2011-07-22 ENCOUNTER — Other Ambulatory Visit: Payer: Self-pay | Admitting: Cardiology

## 2011-08-10 ENCOUNTER — Telehealth: Payer: Self-pay | Admitting: Cardiology

## 2011-08-10 MED ORDER — DILTIAZEM HCL ER COATED BEADS 240 MG PO CP24: 240.0000 mg | ORAL_CAPSULE | Freq: Every day | ORAL | Status: DC

## 2011-08-10 MED ORDER — ROSUVASTATIN CALCIUM 20 MG PO TABS: 20.0000 mg | ORAL_TABLET | Freq: Every day | ORAL | Status: DC

## 2011-08-10 MED ORDER — METOPROLOL TARTRATE 50 MG PO TABS: 25.0000 mg | ORAL_TABLET | Freq: Two times a day (BID) | ORAL | Status: DC

## 2011-08-10 NOTE — Telephone Encounter (Signed)
Timothy Eaton called concerned because Timothy Eaton was only given a quantity of 15 pills with last refill and he is out.  She states the pharmacy did not tell her why.  I explained that his last appt was 11/11 and he was due for an appt 11/12.  I made him an appt with Dr Myrtis Ser and gave him enough pills to last until that time.  I told her to make sure that he keeps his appt with Dr Myrtis Ser and she agreed.

## 2011-08-10 NOTE — Telephone Encounter (Signed)
New Problem:     Patinet's wife called because the pharmacy they go to said that these prescriptions diltiazem (CARDIZEM CD) 240 MG 24 hr capsule, metoprolol (LOPRESSOR) 50 MG tablet and CRESTOR 20 MG tablet require authorization for him to receive more refills and he is out. Patient said that if her husband needs an appointment to let them know.  Please call back.

## 2011-08-22 DIAGNOSIS — L821 Other seborrheic keratosis: Secondary | ICD-10-CM | POA: Diagnosis not present

## 2011-08-22 DIAGNOSIS — D485 Neoplasm of uncertain behavior of skin: Secondary | ICD-10-CM | POA: Diagnosis not present

## 2011-08-22 DIAGNOSIS — L989 Disorder of the skin and subcutaneous tissue, unspecified: Secondary | ICD-10-CM | POA: Diagnosis not present

## 2011-09-05 ENCOUNTER — Encounter: Payer: Self-pay | Admitting: Cardiology

## 2011-09-05 DIAGNOSIS — I714 Abdominal aortic aneurysm, without rupture: Secondary | ICD-10-CM | POA: Insufficient documentation

## 2011-09-05 DIAGNOSIS — I480 Paroxysmal atrial fibrillation: Secondary | ICD-10-CM | POA: Insufficient documentation

## 2011-09-05 DIAGNOSIS — I25119 Atherosclerotic heart disease of native coronary artery with unspecified angina pectoris: Secondary | ICD-10-CM | POA: Insufficient documentation

## 2011-09-05 DIAGNOSIS — I4891 Unspecified atrial fibrillation: Secondary | ICD-10-CM | POA: Insufficient documentation

## 2011-09-05 DIAGNOSIS — R0609 Other forms of dyspnea: Secondary | ICD-10-CM | POA: Insufficient documentation

## 2011-09-05 DIAGNOSIS — I2510 Atherosclerotic heart disease of native coronary artery without angina pectoris: Secondary | ICD-10-CM | POA: Insufficient documentation

## 2011-09-05 DIAGNOSIS — R0602 Shortness of breath: Secondary | ICD-10-CM | POA: Insufficient documentation

## 2011-09-05 DIAGNOSIS — I739 Peripheral vascular disease, unspecified: Secondary | ICD-10-CM

## 2011-09-06 ENCOUNTER — Ambulatory Visit (INDEPENDENT_AMBULATORY_CARE_PROVIDER_SITE_OTHER): Payer: Medicare Other | Admitting: Cardiology

## 2011-09-06 ENCOUNTER — Encounter: Payer: Self-pay | Admitting: Cardiology

## 2011-09-06 DIAGNOSIS — R002 Palpitations: Secondary | ICD-10-CM | POA: Diagnosis not present

## 2011-09-06 DIAGNOSIS — I10 Essential (primary) hypertension: Secondary | ICD-10-CM

## 2011-09-06 DIAGNOSIS — I779 Disorder of arteries and arterioles, unspecified: Secondary | ICD-10-CM

## 2011-09-06 DIAGNOSIS — E785 Hyperlipidemia, unspecified: Secondary | ICD-10-CM | POA: Diagnosis not present

## 2011-09-06 DIAGNOSIS — I714 Abdominal aortic aneurysm, without rupture, unspecified: Secondary | ICD-10-CM

## 2011-09-06 DIAGNOSIS — I2510 Atherosclerotic heart disease of native coronary artery without angina pectoris: Secondary | ICD-10-CM

## 2011-09-06 MED ORDER — ROSUVASTATIN CALCIUM 20 MG PO TABS: 20.0000 mg | ORAL_TABLET | Freq: Every day | ORAL | Status: DC

## 2011-09-06 MED ORDER — METOPROLOL TARTRATE 50 MG PO TABS: 25.0000 mg | ORAL_TABLET | Freq: Two times a day (BID) | ORAL | Status: DC

## 2011-09-06 MED ORDER — DILTIAZEM HCL ER COATED BEADS 240 MG PO CP24: 240.0000 mg | ORAL_CAPSULE | Freq: Every day | ORAL | Status: DC

## 2011-09-06 NOTE — Progress Notes (Signed)
HPI Patient is seen today to followup coronary artery disease. I saw him last September,2011. As part of today's evaluation I have reviewed the patient's old records from the cardiac viewpoint. I have completely updated the new electronic medical record.  The patient is not having any significant chest pain. He had moderate coronary disease by catheterization in 2005. Nuclear scan in September, 2009 revealed no ischemia. He is fully active. He's not having chest pain. He is getting good secondary prevention. He has had some mild palpitations. He sounds very benign. He's not had syncope or presyncope.    Allergies  Allergen Reactions  . Sulfamethoxazole W/Trimethoprim     REACTION: rash    Current Outpatient Prescriptions  Medication Sig Dispense Refill  . aspirin 81 MG EC tablet Take 81 mg by mouth daily.        Marland Kitchen diltiazem (CARDIZEM CD) 240 MG 24 hr capsule Take 1 capsule (240 mg total) by mouth daily.  30 capsule  0  . metoprolol (LOPRESSOR) 50 MG tablet Take 0.5 tablets (25 mg total) by mouth 2 (two) times daily.  30 tablet  0  . rosuvastatin (CRESTOR) 20 MG tablet Take 1 tablet (20 mg total) by mouth daily.  30 tablet  0    History   Social History  . Marital Status: Married    Spouse Name: N/A    Number of Children: 3  . Years of Education: N/A   Occupational History  . Truck driver     Retired   Social History Main Topics  . Smoking status: Former Smoker    Quit date: 08/06/2002  . Smokeless tobacco: Never Used  . Alcohol Use: No  . Drug Use: No  . Sexually Active: Not on file   Other Topics Concern  . Not on file   Social History Narrative  . No narrative on file    Family History  Problem Relation Age of Onset  . Hypertension Mother     ?  . Stroke Mother     CVA  . Diabetes Mother     DM  . Heart attack Father     MI  . Stroke Other     All mom's family died of strokes  . Cancer Sister     Gall bladder     Past Medical History  Diagnosis Date    . Coronary artery disease     Moderate, catheterization 2005, nuclear scan 9/09 no ischemia, EF 60%, echo  . Carotid artery disease     Minimal, doppler 2004, 0-39% bilateral, doppler 05/17/09 0-39% bilateral  . GERD (gastroesophageal reflux disease)   . Hemorrhoids   . Atrial fibrillation     Postoperative in past  . COPD (chronic obstructive pulmonary disease)   . Lung nodule     Followup by pulmonary in past  . ED (erectile dysfunction)   . Hyperlipidemia   . Hypertension   . Ejection fraction     EF 60% echo, September, 2009  . Palpitations     History palpitations in the past  . AAA (abdominal aortic aneurysm)     Edilia Bo, Repaired, 2005  . Shortness of breath     Past Surgical History  Procedure Date  . Back surgery   . Abdominal aortic aneurysm repair 04/06/04    Dr. Edilia Bo  . Coronary angioplasty 12/10/01 & 01/04/04    Percutaneous transluminal coronary angioplasty  . Cholecystectomy 11/26/02  . Pilonidal cyst excision 11/11    ROS  Patient denies fever, chills, headache, sweats, rash, change in vision, change in hearing, chest pain, cough, nausea vomiting, urinary symptoms. He did have a recent GI a viral illness which is now improved. All other systems are reviewed and are negative.  PHYSICAL EXAM Patient is oriented to person time and place. Affect is normal. Head is atraumatic. There is no xanthelasma. There is no jugular venous distention. Lungs are clear. Respiratory effort is nonlabored. Cardiac exam reveals S1 and S2. There no clicks or significant murmurs. The abdomen is soft. There is no peripheral edema. There no musculoskeletal deformities. There are no skin rashes.  Filed Vitals:   09/06/11 1502  BP: 128/72  Pulse: 75  Height: 5\' 9"  (1.753 m)  Weight: 176 lb (79.833 kg)    EKG EKG is done today and reviewed by me. It is normal. There is no significant change.  ASSESSMENT & PLAN

## 2011-09-06 NOTE — Assessment & Plan Note (Signed)
Blood pressure is nicely controlled. No change in therapy. 

## 2011-09-06 NOTE — Assessment & Plan Note (Signed)
The patient's carotids have been stable over time. However it's now been 2 years since his last carotid Doppler. We will arrange for a followup Doppler.

## 2011-09-06 NOTE — Patient Instructions (Addendum)
Your physician has requested that you have a carotid duplex. This test is an ultrasound of the carotid arteries in your neck. It looks at blood flow through these arteries that supply the brain with blood. Allow one hour for this exam. There are no restrictions or special instructions.  Your physician wants you to follow-up in: one year.   You will receive a reminder letter in the mail two months in advance. If you don't receive a letter, please call our office to schedule the follow-up appointment.    

## 2011-09-06 NOTE — Assessment & Plan Note (Signed)
The patient underwent abdominal aortic aneurysm repair in 2005. He is stable. I will consider what timing we should use to do a followup Doppler.

## 2011-09-06 NOTE — Assessment & Plan Note (Signed)
Coronary disease is stable. EKG is unchanged. He is not having significant symptoms. No change in therapy.

## 2011-09-06 NOTE — Assessment & Plan Note (Signed)
The patient's lipids are being well treated. His last labs showed excellent control. He does not meet labs done at this time.

## 2011-09-06 NOTE — Assessment & Plan Note (Signed)
The patient has had some mild recent palpitations. These sound benign. I've reassured him. I feel that we do not need to do further testing at this time.

## 2011-09-14 DIAGNOSIS — L989 Disorder of the skin and subcutaneous tissue, unspecified: Secondary | ICD-10-CM | POA: Diagnosis not present

## 2011-09-14 DIAGNOSIS — D485 Neoplasm of uncertain behavior of skin: Secondary | ICD-10-CM | POA: Diagnosis not present

## 2011-09-14 NOTE — Progress Notes (Signed)
Addended by: Judithe Modest D on: 09/14/2011 02:45 PM   Modules accepted: Orders

## 2011-09-18 ENCOUNTER — Other Ambulatory Visit: Payer: Self-pay | Admitting: Cardiology

## 2011-09-18 DIAGNOSIS — I6529 Occlusion and stenosis of unspecified carotid artery: Secondary | ICD-10-CM

## 2011-09-20 ENCOUNTER — Encounter (INDEPENDENT_AMBULATORY_CARE_PROVIDER_SITE_OTHER): Payer: Medicare Other | Admitting: *Deleted

## 2011-09-20 DIAGNOSIS — I6529 Occlusion and stenosis of unspecified carotid artery: Secondary | ICD-10-CM

## 2012-03-06 ENCOUNTER — Encounter: Payer: Self-pay | Admitting: Internal Medicine

## 2012-03-06 ENCOUNTER — Ambulatory Visit (INDEPENDENT_AMBULATORY_CARE_PROVIDER_SITE_OTHER): Payer: Medicare Other | Admitting: Internal Medicine

## 2012-03-06 VITALS — BP 148/54 | HR 54 | Temp 98.3°F | Resp 16 | Wt 184.0 lb

## 2012-03-06 DIAGNOSIS — I10 Essential (primary) hypertension: Secondary | ICD-10-CM

## 2012-03-07 NOTE — Progress Notes (Signed)
  Subjective:    Patient ID: Timothy Eaton, male    DOB: 04-17-1941, 71 y.o.   MRN: [id number]  HPI  No visit Wrong Timothy Eaton  Review of Systems     Objective:   Physical Exam        Assessment & Plan:

## 2012-06-04 DIAGNOSIS — Z23 Encounter for immunization: Secondary | ICD-10-CM | POA: Diagnosis not present

## 2012-06-13 DIAGNOSIS — L570 Actinic keratosis: Secondary | ICD-10-CM | POA: Diagnosis not present

## 2012-06-13 DIAGNOSIS — L821 Other seborrheic keratosis: Secondary | ICD-10-CM | POA: Diagnosis not present

## 2012-09-05 ENCOUNTER — Other Ambulatory Visit: Payer: Self-pay

## 2012-09-05 MED ORDER — ROSUVASTATIN CALCIUM 20 MG PO TABS: 20.0000 mg | ORAL_TABLET | Freq: Every day | ORAL | Status: DC

## 2012-09-05 MED ORDER — METOPROLOL TARTRATE 50 MG PO TABS: 25.0000 mg | ORAL_TABLET | Freq: Two times a day (BID) | ORAL | Status: DC

## 2012-09-05 MED ORDER — DILTIAZEM HCL ER COATED BEADS 240 MG PO CP24: 240.0000 mg | ORAL_CAPSULE | Freq: Every day | ORAL | Status: DC

## 2012-09-11 ENCOUNTER — Encounter: Payer: Self-pay | Admitting: Cardiology

## 2012-09-15 ENCOUNTER — Ambulatory Visit (INDEPENDENT_AMBULATORY_CARE_PROVIDER_SITE_OTHER)
Admission: RE | Admit: 2012-09-15 | Discharge: 2012-09-15 | Disposition: A | Discharge status: Home / Self Care | Payer: Medicare Other | Source: Ambulatory Visit | Attending: Cardiology | Admitting: Cardiology

## 2012-09-15 ENCOUNTER — Ambulatory Visit (INDEPENDENT_AMBULATORY_CARE_PROVIDER_SITE_OTHER): Payer: Medicare Other | Admitting: Cardiology

## 2012-09-15 ENCOUNTER — Encounter: Payer: Self-pay | Admitting: Cardiology

## 2012-09-15 VITALS — BP 112/68 | HR 64 | Ht 69.0 in | Wt 186.0 lb

## 2012-09-15 DIAGNOSIS — I714 Abdominal aortic aneurysm, without rupture: Secondary | ICD-10-CM

## 2012-09-15 DIAGNOSIS — R002 Palpitations: Secondary | ICD-10-CM

## 2012-09-15 DIAGNOSIS — I10 Essential (primary) hypertension: Secondary | ICD-10-CM | POA: Diagnosis not present

## 2012-09-15 DIAGNOSIS — I4891 Unspecified atrial fibrillation: Secondary | ICD-10-CM | POA: Diagnosis not present

## 2012-09-15 DIAGNOSIS — I779 Disorder of arteries and arterioles, unspecified: Secondary | ICD-10-CM

## 2012-09-15 DIAGNOSIS — I2510 Atherosclerotic heart disease of native coronary artery without angina pectoris: Secondary | ICD-10-CM | POA: Diagnosis not present

## 2012-09-15 DIAGNOSIS — R0602 Shortness of breath: Secondary | ICD-10-CM

## 2012-09-15 DIAGNOSIS — J449 Chronic obstructive pulmonary disease, unspecified: Secondary | ICD-10-CM

## 2012-09-15 DIAGNOSIS — E785 Hyperlipidemia, unspecified: Secondary | ICD-10-CM | POA: Diagnosis not present

## 2012-09-15 LAB — CBC WITH DIFFERENTIAL/PLATELET
Basophils Relative: 0.4 % (ref 0.0–3.0)
Eosinophils Relative: 2.1 % (ref 0.0–5.0)
HCT: 47.8 % (ref 39.0–52.0)
Hemoglobin: 16 g/dL (ref 13.0–17.0)
Lymphs Abs: 1.7 10*3/uL (ref 0.7–4.0)
MCV: 89.8 fl (ref 78.0–100.0)
Monocytes Absolute: 0.6 10*3/uL (ref 0.1–1.0)
Monocytes Relative: 9.9 % (ref 3.0–12.0)
Neutro Abs: 3.3 10*3/uL (ref 1.4–7.7)
Platelets: 186 10*3/uL (ref 150.0–400.0)
WBC: 5.7 10*3/uL (ref 4.5–10.5)

## 2012-09-15 LAB — BASIC METABOLIC PANEL
BUN: 20 mg/dL (ref 6–23)
CO2: 30 mEq/L (ref 19–32)
Calcium: 9.4 mg/dL (ref 8.4–10.5)
Chloride: 104 mEq/L (ref 96–112)
Creatinine, Ser: 1.2 mg/dL (ref 0.4–1.5)
Glucose, Bld: 87 mg/dL (ref 70–99)

## 2012-09-15 LAB — TSH: TSH: 1.98 u[IU]/mL (ref 0.35–5.50)

## 2012-09-15 NOTE — Progress Notes (Signed)
HPI  Patient is seen today to followup coronary disease. I have reviewed the record and may to change. The patient has known coronary disease that was moderate by catheterization in 2005. He did not have a coronary intervention at that time. I have corrected the past surgical history in this regard. The patient has never had a coronary intervention. He has had an abdominal aortic aneurysm repair.  He tells me that he is having exertional shortness of breath. Previously he had some of this when he gained approximately 20 pounds. Over this winter he has gained 8 pounds and he feels short of breath. However it is not clear whether this could be an anginal equivalent or not. He's not having any PND or orthopnea.  Allergies  Allergen Reactions  . Sulfamethoxazole W-Trimethoprim     REACTION: rash    Current Outpatient Prescriptions  Medication Sig Dispense Refill  . aspirin 81 MG EC tablet Take 81 mg by mouth daily.        Marland Kitchen diltiazem (CARDIZEM CD) 240 MG 24 hr capsule Take 1 capsule (240 mg total) by mouth daily.  30 capsule  1  . metoprolol (LOPRESSOR) 50 MG tablet Take 0.5 tablets (25 mg total) by mouth 2 (two) times daily.  30 tablet  1  . rosuvastatin (CRESTOR) 20 MG tablet Take 1 tablet (20 mg total) by mouth daily.  30 tablet  1   No current facility-administered medications for this visit.    History   Social History  . Marital Status: Married    Spouse Name: N/A    Number of Children: 3  . Years of Education: N/A   Occupational History  . Truck driver     Retired   Social History Main Topics  . Smoking status: Former Smoker    Quit date: 08/06/2002  . Smokeless tobacco: Never Used  . Alcohol Use: No  . Drug Use: No  . Sexually Active: Not on file   Other Topics Concern  . Not on file   Social History Narrative  . No narrative on file    Family History  Problem Relation Age of Onset  . Hypertension Mother     ?  . Stroke Mother     CVA  . Diabetes Mother       DM  . Heart attack Father     MI  . Stroke Other     All mom's family died of strokes  . Cancer Sister     Gall bladder     Past Medical History  Diagnosis Date  . Coronary artery disease     Moderate, catheterization 2005, nuclear scan 9/09 no ischemia, EF 60%, echo  . Carotid artery disease     Minimal, doppler 2004, 0-39% bilateral, doppler 05/17/09 0-39% bilateral  . GERD (gastroesophageal reflux disease)   . Hemorrhoids   . Atrial fibrillation     Postoperative in past  . COPD (chronic obstructive pulmonary disease)   . Lung nodule     Followup by pulmonary in past  . ED (erectile dysfunction)   . Hyperlipidemia   . Hypertension   . Ejection fraction     EF 60% echo, September, 2009  . Palpitations     History palpitations in the past  . AAA (abdominal aortic aneurysm)     Edilia Bo, Repaired, 2005  . Shortness of breath   . Palpitations     January, 2013    Past Surgical History  Procedure Laterality Date  .  Back surgery    . Abdominal aortic aneurysm repair  04/06/04    Dr. Edilia Bo  . Cholecystectomy  11/26/02  . Pilonidal cyst excision  11/11    Patient Active Problem List  Diagnosis  . HYPERLIPIDEMIA  . HYPERTENSION  . COPD  . ERECTILE DYSFUNCTION, ORGANIC  . HEMORRHOIDS, HX OF  . Pulmonary nodules  . Coronary artery disease  . Carotid artery disease  . Ejection fraction  . AAA (abdominal aortic aneurysm)  . Shortness of breath  . Atrial fibrillation  . Palpitations    ROS   Patient denies fever, chills, headache, sweats, rash, change in vision, change in hearing, chest pain, cough, nausea vomiting, urinary symptoms. All other systems are reviewed and are negative.  PHYSICAL EXAM  Patient is oriented to person time and place. Affect is normal. There is no jugulovenous distention. Examination the lungs reveal some decrease in breath sounds. No rales are heard.Marland Kitchen Respiratory effort is nonlabored. Cardiac exam reveals S1 and S2. There are no  clicks or significant murmurs. The abdomen is soft. Is no peripheral edema. There no musculoskeletal deformities. There are no skin rashes.  Filed Vitals:   09/15/12 1409  BP: 112/68  Pulse: 64  Height: 5\' 9"  (1.753 m)  Weight: 186 lb (84.369 kg)  SpO2: 98%   EKG is done and reviewed by me. The EKG is normal. There is no change from the past.  ASSESSMENT & PLAN

## 2012-09-15 NOTE — Assessment & Plan Note (Addendum)
His current complaint is shortness of breath with exertion. He will have a chest x-ray. Pharmacologic stress test will be done.I will then see him back for followup.  As part of today's evaluation I spent more than 25 minutes reviewing the patient's chart and old records and updating the record and talking to him. More than half of this time was spent discussing his medical issues and doing his physical exam and counseling him.

## 2012-09-15 NOTE — Assessment & Plan Note (Signed)
The patient had moderate coronary disease by cath 2005. Nuclear scan 2009 revealed no ischemia. I'm concerned about his exertional shortness of breath. We will proceed with a pharmacologic nuclear scan. He says he thinks he would not be able to walk well.

## 2012-09-15 NOTE — Assessment & Plan Note (Signed)
He is not having any significant palpitations. No further workup. 

## 2012-09-15 NOTE — Assessment & Plan Note (Signed)
Lipids are being treated. No change in therapy. 

## 2012-09-15 NOTE — Assessment & Plan Note (Signed)
Blood pressure is stable. No change in therapy. 

## 2012-09-15 NOTE — Assessment & Plan Note (Signed)
Patient has mild carotid disease. He will need a followup Doppler in February, 2015.

## 2012-09-15 NOTE — Assessment & Plan Note (Signed)
There is a history of COPD. It is not clear how much this is playing a role at this time. Chest x-ray will be done. There is a question of a nodule in the past that had been followed by the pulmonary team.

## 2012-09-15 NOTE — Assessment & Plan Note (Signed)
The patient had abdominal aortic aneurysm repaired in 2005.

## 2012-09-15 NOTE — Patient Instructions (Addendum)
Your physician recommends that you return for lab work in: today (cbc, tsh, bmet)  Your physician has requested that you have a lexiscan myoview. For further information please visit [email or web address] Please follow instruction sheet, as given.  A chest x-ray takes a picture of the organs and structures inside the chest, including the heart, lungs, and blood vessels. This test can show several things, including, whether the heart is enlarges; whether fluid is building up in the lungs; and whether pacemaker / defibrillator leads are still in place.  This will be done at the [name] office on [facility]  Your physician recommends that you schedule a follow-up appointment in: on 09/26/12 if your tests are all done

## 2012-09-23 ENCOUNTER — Ambulatory Visit (HOSPITAL_COMMUNITY): Payer: Medicare Other | Attending: Cardiovascular Disease | Admitting: Radiology

## 2012-09-23 VITALS — BP 126/88 | Ht 70.0 in | Wt 184.0 lb

## 2012-09-23 DIAGNOSIS — R0602 Shortness of breath: Secondary | ICD-10-CM

## 2012-09-23 DIAGNOSIS — E785 Hyperlipidemia, unspecified: Secondary | ICD-10-CM | POA: Insufficient documentation

## 2012-09-23 DIAGNOSIS — I10 Essential (primary) hypertension: Secondary | ICD-10-CM | POA: Diagnosis not present

## 2012-09-23 DIAGNOSIS — Z8249 Family history of ischemic heart disease and other diseases of the circulatory system: Secondary | ICD-10-CM | POA: Diagnosis not present

## 2012-09-23 DIAGNOSIS — J449 Chronic obstructive pulmonary disease, unspecified: Secondary | ICD-10-CM | POA: Diagnosis not present

## 2012-09-23 DIAGNOSIS — J4489 Other specified chronic obstructive pulmonary disease: Secondary | ICD-10-CM | POA: Insufficient documentation

## 2012-09-23 DIAGNOSIS — R0609 Other forms of dyspnea: Secondary | ICD-10-CM | POA: Diagnosis not present

## 2012-09-23 DIAGNOSIS — R0989 Other specified symptoms and signs involving the circulatory and respiratory systems: Secondary | ICD-10-CM | POA: Diagnosis not present

## 2012-09-23 DIAGNOSIS — I739 Peripheral vascular disease, unspecified: Secondary | ICD-10-CM | POA: Insufficient documentation

## 2012-09-23 MED ORDER — REGADENOSON 0.4 MG/5ML IV SOLN
0.4000 mg | Freq: Once | INTRAVENOUS | Status: AC
  Administered 2012-09-23: 0.4 mg via INTRAVENOUS

## 2012-09-23 MED ORDER — TECHNETIUM TC 99M SESTAMIBI GENERIC - CARDIOLITE
10.0000 | Freq: Once | INTRAVENOUS | Status: AC | PRN
  Administered 2012-09-23: 10 via INTRAVENOUS

## 2012-09-23 MED ORDER — TECHNETIUM TC 99M SESTAMIBI GENERIC - CARDIOLITE
30.0000 | Freq: Once | INTRAVENOUS | Status: AC | PRN
  Administered 2012-09-23: 30 via INTRAVENOUS

## 2012-09-23 NOTE — Progress Notes (Signed)
[facility] SITE 3 NUCLEAR MED [address] [place], [place] [address] [phone]    Cardiology Nuclear Med Study  Timothy Eaton is a 72 y.o. male     MRN : [id number]     DOB: 01-14-1941  Procedure Date: 09/23/2012  Nuclear Med Background Indication for Stress Test:  Evaluation for Ischemia History:  COPD and H/O Post OP AFIB, '05 Heart Cath: mod CAD EF: 55%, '09 ECHO: 60%, MPS: (-) ischemia EF: 66% Cardiac Risk Factors: Carotid Disease, Family History - CAD, History of Smoking, Hypertension, Lipids and PVD  Symptoms:  DOE   Nuclear Pre-Procedure Caffeine/Decaff Intake:  None NPO After: 7:00pm   Lungs:  clear O2 Sat: 95% on room air. IV 0.9% NS with Angio Cath:  20g  IV Site: R Hand  IV Started by:  Cathlyn Parsons, RN  Chest Size (in):  50 Cup Size: n/a  Height: 5\' 10"  (1.778 m)  Weight:  184 lb (83.462 kg)  BMI:  Body mass index is 26.4 kg/(m^2). Tech Comments:  Lopressor taken 0800    Nuclear Med Study 1 or 2 day study: 1 day  Stress Test Type:  Lexiscan  Reading MD: Kristeen Miss, MD  Order Authorizing Provider:  Effie Shy  Resting Radionuclide: Technetium 66m Sestamibi  Resting Radionuclide Dose: 11.0 mCi   Stress Radionuclide:  Technetium 68m Sestamibi  Stress Radionuclide Dose: 33.0 mCi           Stress Protocol Rest HR: 61 Stress HR: 86  Rest BP: 126/88 Stress BP: 165/78  Exercise Time (min): n/a METS: n/a   Predicted Max HR: 148 bpm % Max HR: 58.11 bpm Rate Pressure Product: 21308   Dose of Adenosine (mg):  n/a Dose of Lexiscan: 0.4 mg  Dose of Atropine (mg): n/a Dose of Dobutamine: n/a mcg/kg/min (at max HR)  Stress Test Technologist: Milana Na, EMT-P  Nuclear Technologist:  Domenic Polite, CNMT     Rest Procedure:  Myocardial perfusion imaging was performed at rest 45 minutes following the intravenous administration of Technetium 84m Sestamibi. Rest ECG: NSR - Normal EKG  Stress Procedure:  The patient received IV  Lexiscan 0.4 mg over 15-seconds. The patient was SOB with Lexiscan. Technetium 48m Sestamibi injected at 30-seconds.  Quantitative spect images were obtained after a 45 minute delay. Stress ECG: No significant change from baseline ECG  QPS Raw Data Images:  Mild diaphragmatic attenuation.  Normal left ventricular size. Stress Images:  There is a medium sized, moderate defect in the entire inferior wall.  The uptake in the other regions is normal.  Rest Images:  There is a medium sized, moderate defect in the basal and mid inferior wall.  The uptake in the apical inferior wall has improved slightly.   The uptake in the other regions is norma Subtraction (SDS):  There is a medium sized fixed defect in the inferior wall with a small of redistribution of the inferior apical segments.       Transient Ischemic Dilatation (Normal <1.22):  1.04 Lung/Heart Ratio (Normal <0.45):  0.25  Quantitative Gated Spect Images QGS EDV:  71 ml QGS ESV:  25 ml  Impression Exercise Capacity:  Lexiscan with no exercise. BP Response:  Normal blood pressure response. Clinical Symptoms:  No significant symptoms noted. ECG Impression:  No significant ST segment change suggestive of ischemia. Comparison with Prior Nuclear Study: No images to compare  Overall Impression:  Intermediate stress nuclear study.  There is a medium sized defect in  the inferior wall is partially reversible.  This may represent inferior ischemia or may be due to diaphragmatic attenuation.    LV Ejection Fraction: 65%.  LV Wall Motion:  There is normal wall motion.  The inferior wall seems to contract normally.   Timothy Eaton, Timothy Eaton., MD, [facility] 09/23/2012, 6:15 PM Office - [phone] Pager [phone]

## 2012-09-24 ENCOUNTER — Encounter: Payer: Self-pay | Admitting: Cardiology

## 2012-09-25 ENCOUNTER — Encounter: Payer: Self-pay | Admitting: Internal Medicine

## 2012-09-25 ENCOUNTER — Non-Acute Institutional Stay: Payer: Medicare Other | Admitting: Internal Medicine

## 2012-09-26 ENCOUNTER — Ambulatory Visit (INDEPENDENT_AMBULATORY_CARE_PROVIDER_SITE_OTHER): Payer: Medicare Other | Admitting: Cardiology

## 2012-09-26 ENCOUNTER — Encounter: Payer: Self-pay | Admitting: Cardiology

## 2012-09-26 ENCOUNTER — Telehealth: Payer: Self-pay | Admitting: *Deleted

## 2012-09-26 VITALS — BP 122/80 | HR 76 | Resp 12 | Ht 70.0 in | Wt 187.6 lb

## 2012-09-26 DIAGNOSIS — J449 Chronic obstructive pulmonary disease, unspecified: Secondary | ICD-10-CM

## 2012-09-26 DIAGNOSIS — I2510 Atherosclerotic heart disease of native coronary artery without angina pectoris: Secondary | ICD-10-CM

## 2012-09-26 NOTE — Telephone Encounter (Signed)
Message copied by Eustace Moore on Fri Sep 26, 2012 10:44 AM ------      Message from: Timothy Eaton      Created: Sat Sep 20, 2012  3:46 PM       Please let him know that his chest x-ray does not show any fluid overload. It does show chronic lung changes ------

## 2012-09-26 NOTE — Assessment & Plan Note (Signed)
The nuclear scan raises the possibility of some mild inferior ischemia or diaphragmatic attenuation. I feel that this is not a marked finding in his situation. I've chosen not to change his medications and not to proceed with cardiac catheterization. He is not having any chest pain. He'll see him back in several months to assess further.

## 2012-09-26 NOTE — Telephone Encounter (Signed)
Patient's wife informed

## 2012-09-26 NOTE — Assessment & Plan Note (Signed)
The patient has severe emphysema on his chest x-ray. I suspect this is the primary reason for his shortness of breath. I had a careful discussion with him about this. He stopped smoking many years ago.

## 2012-09-26 NOTE — Progress Notes (Signed)
HPI  The patient is seen to followup exertional shortness of breath. I saw him last in September 15, 2012. It was known that he had mild coronary disease in 2005. He does have lung disease. I decided to proceed with obtaining some lab studies and a chest x-ray and a nuclear exercise test. The chest x-ray showed severe emphysema. Hemoglobin was 16 with a normal TSH and normal renal function. His nuclear scan did show some decreased uptake in the inferior wall. The study is read as the possibility of some inferior ischemia or possibly diaphragmatic attenuation.  Allergies  Allergen Reactions  . Sulfamethoxazole W-Trimethoprim     REACTION: rash    Current Outpatient Prescriptions  Medication Sig Dispense Refill  . aspirin 81 MG EC tablet Take 81 mg by mouth daily.        Marland Kitchen diltiazem (CARDIZEM CD) 240 MG 24 hr capsule Take 1 capsule (240 mg total) by mouth daily.  30 capsule  1  . metoprolol (LOPRESSOR) 50 MG tablet Take 0.5 tablets (25 mg total) by mouth 2 (two) times daily.  30 tablet  1  . rosuvastatin (CRESTOR) 20 MG tablet Take 1 tablet (20 mg total) by mouth daily.  30 tablet  1   No current facility-administered medications for this visit.    History   Social History  . Marital Status: Married    Spouse Name: N/A    Number of Children: 3  . Years of Education: N/A   Occupational History  . Truck driver     Retired   Social History Main Topics  . Smoking status: Former Smoker    Quit date: 08/06/2002  . Smokeless tobacco: Current User    Types: Chew  . Alcohol Use: No  . Drug Use: No  . Sexually Active: Not on file   Other Topics Concern  . Not on file   Social History Narrative  . No narrative on file    Family History  Problem Relation Age of Onset  . Hypertension Mother     ?  . Stroke Mother     CVA  . Diabetes Mother     DM  . Heart attack Father     MI  . Stroke Other     All mom's family died of strokes  . Cancer Sister     Gall bladder      Past Medical History  Diagnosis Date  . Coronary artery disease     Moderate, catheterization 2005, nuclear scan 9/09 no ischemia, EF 60%, echo  . Carotid artery disease     Minimal, doppler 2004, 0-39% bilateral, doppler 05/17/09 0-39% bilateral  . GERD (gastroesophageal reflux disease)   . Hemorrhoids   . Atrial fibrillation     Postoperative in past  . COPD (chronic obstructive pulmonary disease)   . Lung nodule     Followup by pulmonary in past  . ED (erectile dysfunction)   . Hyperlipidemia   . Hypertension   . Ejection fraction     EF 60% echo, September, 2009  . Palpitations     History palpitations in the past  . AAA (abdominal aortic aneurysm)     Edilia Bo, Repaired, 2005  . Shortness of breath   . Palpitations     January, 2013    Past Surgical History  Procedure Laterality Date  . Back surgery    . Abdominal aortic aneurysm repair  04/06/04    Dr. Edilia Bo  . Cholecystectomy  11/26/02  .  Pilonidal cyst excision  11/11    Patient Active Problem List  Diagnosis  . HYPERLIPIDEMIA  . HYPERTENSION  . COPD  . ERECTILE DYSFUNCTION, ORGANIC  . HEMORRHOIDS, HX OF  . Pulmonary nodules  . Coronary artery disease  . Carotid artery disease  . Ejection fraction  . AAA (abdominal aortic aneurysm)  . Shortness of breath  . Atrial fibrillation  . Palpitations    ROS   Patient denies fever, chills, headache, sweats, rash, change in vision, change in hearing, chest pain, cough, nausea or vomiting, urinary symptoms. All other systems are reviewed and are negative.  PHYSICAL EXAM  Patient is oriented to person time and place. Affect is normal. There is no jugulovenous distention. Lungs reveal decreased breath sounds. There is no respiratory distress. Cardiac exam reveals S1 and S2. There no clicks or significant murmurs. The abdomen is soft. There is no peripheral edema.  Filed Vitals:   09/26/12 1622  BP: 122/80  Pulse: 76  Resp: 12  Height: 5\' 10"  (1.778 m)   Weight: 187 lb 9.6 oz (85.095 kg)  SpO2: 96%     ASSESSMENT & PLAN

## 2012-09-26 NOTE — Patient Instructions (Addendum)
Your physician recommends that you schedule a follow-up appointment in: end of May

## 2012-10-29 ENCOUNTER — Other Ambulatory Visit: Payer: Self-pay

## 2012-10-29 MED ORDER — ROSUVASTATIN CALCIUM 20 MG PO TABS: 20.0000 mg | ORAL_TABLET | Freq: Every day | ORAL | Status: DC

## 2012-10-29 MED ORDER — METOPROLOL TARTRATE 50 MG PO TABS: 25.0000 mg | ORAL_TABLET | Freq: Two times a day (BID) | ORAL | Status: DC

## 2012-10-29 MED ORDER — DILTIAZEM HCL ER COATED BEADS 240 MG PO CP24: 240.0000 mg | ORAL_CAPSULE | Freq: Every day | ORAL | Status: DC

## 2012-11-05 DIAGNOSIS — H903 Sensorineural hearing loss, bilateral: Secondary | ICD-10-CM | POA: Diagnosis not present

## 2012-11-05 DIAGNOSIS — H6120 Impacted cerumen, unspecified ear: Secondary | ICD-10-CM | POA: Diagnosis not present

## 2012-11-05 DIAGNOSIS — H905 Unspecified sensorineural hearing loss: Secondary | ICD-10-CM | POA: Diagnosis not present

## 2012-12-19 ENCOUNTER — Encounter: Payer: Self-pay | Admitting: Cardiology

## 2012-12-19 ENCOUNTER — Ambulatory Visit (INDEPENDENT_AMBULATORY_CARE_PROVIDER_SITE_OTHER): Payer: Medicare Other | Admitting: Cardiology

## 2012-12-19 VITALS — BP 130/88 | HR 56 | Ht 70.0 in | Wt 170.0 lb

## 2012-12-19 DIAGNOSIS — I2510 Atherosclerotic heart disease of native coronary artery without angina pectoris: Secondary | ICD-10-CM | POA: Diagnosis not present

## 2012-12-19 DIAGNOSIS — R0602 Shortness of breath: Secondary | ICD-10-CM

## 2012-12-19 NOTE — Patient Instructions (Addendum)
**Note De-identified  Obfuscation** Your physician recommends that you continue on your current medications as directed. Please refer to the Current Medication list given to you today.  Your physician wants you to follow-up in: 1 year. You will receive a reminder letter in the mail two months in advance. If you don't receive a letter, please call our office to schedule the follow-up appointment.  

## 2012-12-19 NOTE — Assessment & Plan Note (Signed)
Coronary disease is stable. No further workup at this time. 

## 2012-12-19 NOTE — Assessment & Plan Note (Signed)
He shortness of breath has improved markedly he has he's lost weight. No further workup. I will see him back in one year for followup.

## 2012-12-19 NOTE — Progress Notes (Signed)
HPI  Patient is seen today to followup exertional shortness of breath. I had seen him in early February and then a second time later in February. He had a nuclear scan showing some decreased inferior uptake. There was a possibility of some ischemia. However he has felt better. He become quite motivated and lost 17 pounds. He is walking regularly and looks great. He's not having any chest discomfort.  Allergies  Allergen Reactions  . Sulfamethoxazole W-Trimethoprim     REACTION: rash    Current Outpatient Prescriptions  Medication Sig Dispense Refill  . aspirin 81 MG EC tablet Take 81 mg by mouth daily.        Marland Kitchen diltiazem (CARDIZEM CD) 240 MG 24 hr capsule Take 1 capsule (240 mg total) by mouth daily.  30 capsule  11  . metoprolol (LOPRESSOR) 50 MG tablet Take 0.5 tablets (25 mg total) by mouth 2 (two) times daily.  30 tablet  11  . rosuvastatin (CRESTOR) 20 MG tablet Take 1 tablet (20 mg total) by mouth daily.  30 tablet  11   No current facility-administered medications for this visit.    History   Social History  . Marital Status: Married    Spouse Name: N/A    Number of Children: 3  . Years of Education: N/A   Occupational History  . Truck driver     Retired   Social History Main Topics  . Smoking status: Former Smoker    Quit date: 08/06/2002  . Smokeless tobacco: Current User    Types: Chew  . Alcohol Use: No  . Drug Use: No  . Sexually Active: Not on file   Other Topics Concern  . Not on file   Social History Narrative  . No narrative on file    Family History  Problem Relation Age of Onset  . Hypertension Mother     ?  . Stroke Mother     CVA  . Diabetes Mother     DM  . Heart attack Father     MI  . Stroke Other     All mom's family died of strokes  . Cancer Sister     Gall bladder     Past Medical History  Diagnosis Date  . Coronary artery disease     Moderate, catheterization 2005, nuclear scan 9/09 no ischemia, EF 60%, echo  . Carotid  artery disease     Minimal, doppler 2004, 0-39% bilateral, doppler 05/17/09 0-39% bilateral  . GERD (gastroesophageal reflux disease)   . Hemorrhoids   . Atrial fibrillation     Postoperative in past  . COPD (chronic obstructive pulmonary disease)   . Lung nodule     Followup by pulmonary in past  . ED (erectile dysfunction)   . Hyperlipidemia   . Hypertension   . Ejection fraction     EF 60% echo, September, 2009  . Palpitations     History palpitations in the past  . AAA (abdominal aortic aneurysm)     Edilia Bo, Repaired, 2005  . Shortness of breath   . Palpitations     January, 2013    Past Surgical History  Procedure Laterality Date  . Back surgery    . Abdominal aortic aneurysm repair  04/06/04    Dr. Edilia Bo  . Cholecystectomy  11/26/02  . Pilonidal cyst excision  11/11    Patient Active Problem List   Diagnosis Date Noted  . Palpitations   . Coronary artery disease   .  Carotid artery disease   . Ejection fraction   . AAA (abdominal aortic aneurysm)   . Shortness of breath   . Atrial fibrillation   . Pulmonary nodules 04/02/2011  . COPD 04/02/2009  . HEMORRHOIDS, HX OF 04/02/2009  . HYPERLIPIDEMIA 06/25/2008  . HYPERTENSION 06/25/2008  . ERECTILE DYSFUNCTION, ORGANIC 06/25/2008    ROS   Patient denies fever, chills, headache, sweats, rash, change in vision, change in hearing, chest pain, cough, nausea vomiting, urinary symptoms. All other systems are reviewed and are negative.  PHYSICAL EXAM  Patient is oriented to person time and place. Affect is normal. Lungs reveal decreased breath sounds. There is no respiratory distress. Cardiac exam reveals S1 and S2. There no clicks or significant murmurs. The abdomen is soft. There is no peripheral edema.  Filed Vitals:   12/19/12 0951  BP: 130/88  Pulse: 56  Height: 5\' 10"  (1.778 m)  Weight: 170 lb (77.111 kg)     ASSESSMENT & PLAN

## 2013-01-02 ENCOUNTER — Ambulatory Visit: Payer: Medicare Other | Admitting: Cardiology

## 2013-04-30 DIAGNOSIS — Z23 Encounter for immunization: Secondary | ICD-10-CM | POA: Diagnosis not present

## 2013-06-11 DIAGNOSIS — D235 Other benign neoplasm of skin of trunk: Secondary | ICD-10-CM | POA: Diagnosis not present

## 2013-06-11 DIAGNOSIS — L821 Other seborrheic keratosis: Secondary | ICD-10-CM | POA: Diagnosis not present

## 2013-10-12 ENCOUNTER — Telehealth: Payer: Self-pay | Admitting: *Deleted

## 2013-10-12 NOTE — Telephone Encounter (Signed)
Lmom to schedule a carotid doppler (2 yr f/u)

## 2013-10-13 ENCOUNTER — Other Ambulatory Visit (HOSPITAL_COMMUNITY): Payer: Self-pay | Admitting: *Deleted

## 2013-10-13 DIAGNOSIS — I6529 Occlusion and stenosis of unspecified carotid artery: Secondary | ICD-10-CM

## 2013-10-20 ENCOUNTER — Encounter (INDEPENDENT_AMBULATORY_CARE_PROVIDER_SITE_OTHER): Payer: Medicare Other

## 2013-10-20 DIAGNOSIS — I6529 Occlusion and stenosis of unspecified carotid artery: Secondary | ICD-10-CM

## 2013-10-21 ENCOUNTER — Encounter: Payer: Self-pay | Admitting: Cardiology

## 2013-10-26 ENCOUNTER — Other Ambulatory Visit: Payer: Self-pay | Admitting: *Deleted

## 2013-10-26 MED ORDER — DILTIAZEM HCL ER COATED BEADS 240 MG PO CP24: 240.0000 mg | ORAL_CAPSULE | Freq: Every day | ORAL | Status: DC

## 2013-10-26 MED ORDER — METOPROLOL TARTRATE 50 MG PO TABS: 25.0000 mg | ORAL_TABLET | Freq: Two times a day (BID) | ORAL | Status: DC

## 2013-10-26 MED ORDER — ROSUVASTATIN CALCIUM 20 MG PO TABS: 20.0000 mg | ORAL_TABLET | Freq: Every day | ORAL | Status: DC

## 2013-10-29 ENCOUNTER — Ambulatory Visit (INDEPENDENT_AMBULATORY_CARE_PROVIDER_SITE_OTHER): Payer: Medicare Other | Admitting: Family Medicine

## 2013-10-29 ENCOUNTER — Encounter: Payer: Self-pay | Admitting: Family Medicine

## 2013-10-29 VITALS — BP 106/60 | HR 86 | Temp 98.7°F | Wt 181.2 lb

## 2013-10-29 DIAGNOSIS — N50819 Testicular pain, unspecified: Secondary | ICD-10-CM | POA: Insufficient documentation

## 2013-10-29 DIAGNOSIS — I6529 Occlusion and stenosis of unspecified carotid artery: Secondary | ICD-10-CM | POA: Diagnosis not present

## 2013-10-29 DIAGNOSIS — N509 Disorder of male genital organs, unspecified: Secondary | ICD-10-CM | POA: Diagnosis not present

## 2013-10-29 DIAGNOSIS — W57XXXA Bitten or stung by nonvenomous insect and other nonvenomous arthropods, initial encounter: Secondary | ICD-10-CM | POA: Insufficient documentation

## 2013-10-29 DIAGNOSIS — T148 Other injury of unspecified body region: Secondary | ICD-10-CM | POA: Diagnosis not present

## 2013-10-29 LAB — POCT URINALYSIS DIPSTICK
Glucose, UA: NEGATIVE
Ketones, UA: NEGATIVE
Leukocytes, UA: NEGATIVE
NITRITE UA: NEGATIVE
RBC UA: NEGATIVE
Spec Grav, UA: >=1.03
UROBILINOGEN UA: NEGATIVE
pH, UA: 6

## 2013-10-29 MED ORDER — DOXYCYCLINE HYCLATE 100 MG PO CAPS: 100.0000 mg | ORAL_CAPSULE | Freq: Two times a day (BID) | ORAL | Status: DC

## 2013-10-29 NOTE — Patient Instructions (Addendum)
For tick bite - I do want you to start taking doxycycline course for 10 days.  There was a tick head in the skin. For groin pain - I think you have congestive epididymitis - treat with aleve as needed and elevate scrotum with folded towel when prolonged sitting. Let us know if not feeling better with above treatment.  Tick Bite Information Ticks are insects that attach themselves to the skin and draw blood for food. There are various types of ticks. Common types include wood ticks and deer ticks. Most ticks live in shrubs and grassy areas. Ticks can climb onto your body when you make contact with leaves or grass where the tick is waiting. The most common places on the body for ticks to attach themselves are the scalp, neck, armpits, waist, and groin. Most tick bites are harmless, but sometimes ticks carry germs that cause diseases. These germs can be spread to a person during the tick's feeding process. The chance of a disease spreading through a tick bite depends on:   The type of tick.  Time of year.   How long the tick is attached.   Geographic location.  HOW CAN YOU PREVENT TICK BITES? Take these steps to help prevent tick bites when you are outdoors:  Wear protective clothing. Long sleeves and long pants are best.   Wear white clothes so you can see ticks more easily.  Tuck your pant legs into your socks.   If walking on a trail, stay in the middle of the trail to avoid brushing against bushes.  Avoid walking through areas with long grass.  Put insect repellent on all exposed skin and along boot tops, pant legs, and sleeve cuffs.   Check clothing, hair, and skin repeatedly and before going inside.   Brush off any ticks that are not attached.  Take a shower or bath as soon as possible after being outdoors.  WHAT IS THE PROPER WAY TO REMOVE A TICK? Ticks should be removed as soon as possible to help prevent diseases caused by tick bites. 1. If latex gloves are  available, put them on before trying to remove a tick.  2. Using fine-point tweezers, grasp the tick as close to the skin as possible. You may also use curved forceps or a tick removal tool. Grasp the tick as close to its head as possible. Avoid grasping the tick on its body. 3. Pull gently with steady upward pressure until the tick lets go. Do not twist the tick or jerk it suddenly. This may break off the tick's head or mouth parts. 4. Do not squeeze or crush the tick's body. This could force disease-carrying fluids from the tick into your body.  5. After the tick is removed, wash the bite area and your hands with soap and water or other disinfectant such as alcohol. 6. Apply a small amount of antiseptic cream or ointment to the bite site.  7. Wash and disinfect any instruments that were used.  Do not try to remove a tick by applying a hot match, petroleum jelly, or fingernail polish to the tick. These methods do not work and may increase the chances of disease being spread from the tick bite.  WHEN SHOULD YOU SEEK MEDICAL CARE? Contact your health care provider if you are unable to remove a tick from your skin or if a part of the tick breaks off and is stuck in the skin.  After a tick bite, you need to be aware of  signs and symptoms that could be related to diseases spread by ticks. Contact your health care provider if you develop any of the following in the days or weeks after the tick bite:  Unexplained fever.  Rash. A circular rash that appears days or weeks after the tick bite may indicate the possibility of Lyme disease. The rash may resemble a target with a bull's-eye and may occur at a different part of your body than the tick bite.  Redness and swelling in the area of the tick bite.   Tender, swollen lymph glands.   Diarrhea.   Weight loss.   Cough.   Fatigue.   Muscle, joint, or bone pain.   Abdominal pain.   Headache.   Lethargy or a change in your level of  consciousness.  Difficulty walking or moving your legs.   Numbness in the legs.   Paralysis.  Shortness of breath.   Confusion.   Repeated vomiting.  Document Released: 07/20/2000 Document Revised: 05/13/2013 Document Reviewed: 12/31/2012 [facility] Patient Information 2014 [place].

## 2013-10-29 NOTE — Progress Notes (Signed)
Pre visit review using our clinic review tool, if applicable. No additional management support is needed unless otherwise documented below in the visit note. 

## 2013-10-29 NOTE — Assessment & Plan Note (Signed)
With embedded head present for 2 weeks, now with LE myalgias/arthralgias and malaise. Concern for tick borne illness. Will treat presumptively with 10d doxycycline course. Pt agrees with plan.

## 2013-10-29 NOTE — Assessment & Plan Note (Addendum)
Tender at left epididymis Not at risk for infectious epididymitis - anticipate congestive epididymitis and discussed treatment with elevation of scrotum, OTC NSAID like aleve prn. Update if sxs persist or fail to improve. UA concentrated with bilirubin o/w WNL.

## 2013-10-29 NOTE — Progress Notes (Signed)
BP 106/60  Pulse 86  Temp(Src) 98.7 F (37.1 C) (Oral)  Wt 181 lb 4 oz (82.214 kg)  SpO2 95%   CC: malaise after tick bite  Subjective:    Patient ID: Timothy Eaton, male    DOB: May 10, 1941, 73 y.o.   MRN: [id number]  HPI: Timothy Eaton is a 73 y.o. male presenting on 10/29/2013 for Tick bite, weak, body aches   BP Readings from Last 3 Encounters:  10/29/13 106/60  12/19/12 130/88  09/26/12 122/80  Mr. Timothy Eaton started feeling ill after his regular walk this morning.  Endorses body aches mainly in legs. Denies fevers/chills, headache, abd pain or nausea, skin rash.  Did have 2 ticks found on skin over last 2 weeks.  One bit him and attached for maybe 2 days.  2nd one did not attach.  Over last month having some testicular discomfort improved with ambulation/activity.  Monogamous relationship, denies new partners.  No dysuria or penile discharge.  Relevant past medical, surgical, family and social history reviewed and updated as indicated.  Allergies and medications reviewed and updated. Current Outpatient Prescriptions on File Prior to Visit  Medication Sig  . aspirin 81 MG EC tablet Take 81 mg by mouth daily.    Marland Kitchen diltiazem (CARDIZEM CD) 240 MG 24 hr capsule Take 1 capsule (240 mg total) by mouth daily.  . metoprolol (LOPRESSOR) 50 MG tablet Take 0.5 tablets (25 mg total) by mouth 2 (two) times daily.  . rosuvastatin (CRESTOR) 20 MG tablet Take 1 tablet (20 mg total) by mouth daily.   No current facility-administered medications on file prior to visit.    Review of Systems Per HPI unless specifically indicated above    Objective:    BP 106/60  Pulse 86  Temp(Src) 98.7 F (37.1 C) (Oral)  Wt 181 lb 4 oz (82.214 kg)  SpO2 95%  Physical Exam  Nursing note and vitals reviewed. Constitutional: He appears well-developed and well-nourished. No distress.  HENT:  Head: Normocephalic and atraumatic.  Mouth/Throat: Oropharynx is clear and moist. No oropharyngeal exudate.    Eyes: Conjunctivae and EOM are normal. Pupils are equal, round, and reactive to light.  Cardiovascular: Normal rate, regular rhythm, normal heart sounds and intact distal pulses.   No murmur heard. Pulmonary/Chest: Effort normal and breath sounds normal. No respiratory distress. He has no wheezes. He has no rales.  Abdominal: Soft. Normal appearance and bowel sounds are normal. He exhibits no distension and no mass. There is no hepatosplenomegaly. There is no tenderness. There is no rebound and no guarding. Hernia confirmed negative in the right inguinal area and confirmed negative in the left inguinal area.  Genitourinary: Penis normal. Right testis shows no mass, no swelling and no tenderness. Right testis is descended. Left testis shows tenderness. Left testis shows no mass and no swelling. Left testis is descended. Uncircumcised.  Tender to palpation left epididymis  Musculoskeletal: He exhibits no edema.  Lymphadenopathy:       Right: No inguinal adenopathy present.       Left: No inguinal adenopathy present.  Skin: Skin is warm and dry. No rash noted.  Left medial lower leg just below knee with residual tick bite Area cleaned with EtOH swab and using forceps residual tick head removed - confirmed under microscope.  Psychiatric: He has a normal mood and affect.       Assessment & Plan:   Problem List Items Addressed This Visit   Testicular pain - Primary  Tender at left epididymis Not at risk for infectious epididymitis - anticipate congestive epididymitis and discussed treatment with elevation of scrotum, OTC NSAID like aleve prn. Update if sxs persist or fail to improve. UA concentrated with bilirubin o/w WNL.    Relevant Orders      POCT Urinalysis Dipstick (Completed)   Tick bite     With embedded head present for 2 weeks, now with LE myalgias/arthralgias and malaise. Concern for tick borne illness. Will treat presumptively with 10d doxycycline course. Pt agrees with  plan.        Follow up plan: Return if symptoms worsen or fail to improve.

## 2013-12-22 ENCOUNTER — Ambulatory Visit (INDEPENDENT_AMBULATORY_CARE_PROVIDER_SITE_OTHER): Payer: Medicare Other | Admitting: Cardiology

## 2013-12-22 ENCOUNTER — Encounter: Payer: Self-pay | Admitting: Cardiology

## 2013-12-22 VITALS — BP 122/60 | HR 60 | Ht 70.0 in | Wt 176.0 lb

## 2013-12-22 DIAGNOSIS — I10 Essential (primary) hypertension: Secondary | ICD-10-CM

## 2013-12-22 DIAGNOSIS — I2510 Atherosclerotic heart disease of native coronary artery without angina pectoris: Secondary | ICD-10-CM | POA: Diagnosis not present

## 2013-12-22 DIAGNOSIS — I779 Disorder of arteries and arterioles, unspecified: Secondary | ICD-10-CM | POA: Diagnosis not present

## 2013-12-22 DIAGNOSIS — E785 Hyperlipidemia, unspecified: Secondary | ICD-10-CM | POA: Diagnosis not present

## 2013-12-22 DIAGNOSIS — I739 Peripheral vascular disease, unspecified: Secondary | ICD-10-CM

## 2013-12-22 DIAGNOSIS — I6529 Occlusion and stenosis of unspecified carotid artery: Secondary | ICD-10-CM

## 2013-12-22 NOTE — Assessment & Plan Note (Signed)
Carotid disease is stable. No change in therapy.

## 2013-12-22 NOTE — Assessment & Plan Note (Signed)
Blood pressure is well controlled. No change in therapy.

## 2013-12-22 NOTE — Assessment & Plan Note (Signed)
Lipids are treated with guideline directed therapy. No change in therapy.

## 2013-12-22 NOTE — Progress Notes (Signed)
Patient ID: Timothy Eaton, male   DOB: [birth year], 73 y.o.   MRN: [id number]    HPI  Patient is seen in followup coronary disease. He's really quite stable. His carotids are followed carefully. He had a followup Doppler in March, 2015. He is mild disease. He needs a 2 year followup. He is fully active. He has no chest pain or shortness of breath. He had a nuclear scan early 2014. There was question of some mild ischemia. He did not require catheterization at that time. He is not currently having any symptoms.  The patient tells me that his wife asked him to mention to me that he has had some decreased short-term memory. I explained to him that there was no evidence that any of the medications that he is currently taking causes this type of problem. Allergies  Allergen Reactions  . Sulfamethoxazole-Trimethoprim     REACTION: rash    Current Outpatient Prescriptions  Medication Sig Dispense Refill  . aspirin 81 MG EC tablet Take 81 mg by mouth daily.        Marland Kitchen diltiazem (CARDIZEM CD) 240 MG 24 hr capsule Take 1 capsule (240 mg total) by mouth daily.  30 capsule  1  . metoprolol (LOPRESSOR) 50 MG tablet Take 0.5 tablets (25 mg total) by mouth 2 (two) times daily.  30 tablet  1  . rosuvastatin (CRESTOR) 20 MG tablet Take 1 tablet (20 mg total) by mouth daily.  30 tablet  1   No current facility-administered medications for this visit.    History   Social History  . Marital Status: Married    Spouse Name: N/A    Number of Children: 3  . Years of Education: N/A   Occupational History  . Truck driver     Retired   Social History Main Topics  . Smoking status: Former Smoker    Quit date: 08/06/2002  . Smokeless tobacco: Current User    Types: Chew  . Alcohol Use: No  . Drug Use: No  . Sexual Activity: Not on file   Other Topics Concern  . Not on file   Social History Narrative  . No narrative on file    Family History  Problem Relation Age of Onset  . Hypertension Mother       ?  . Stroke Mother     CVA  . Diabetes Mother     DM  . Heart attack Father     MI  . Stroke Other     All mom's family died of strokes  . Cancer Sister     Gall bladder     Past Medical History  Diagnosis Date  . Coronary artery disease     Moderate, catheterization 2005, nuclear scan 9/09 no ischemia, EF 60%, echo  . Carotid artery disease     Minimal, doppler 2004, 0-39% bilateral, doppler 05/17/09 0-39% bilateral  . GERD (gastroesophageal reflux disease)   . Hemorrhoids   . Atrial fibrillation     Postoperative in past  . COPD (chronic obstructive pulmonary disease)   . Lung nodule     Followup by pulmonary in past  . ED (erectile dysfunction)   . Hyperlipidemia   . Hypertension   . Ejection fraction     EF 60% echo, September, 2009  . Palpitations     History palpitations in the past  . AAA (abdominal aortic aneurysm)     [name], Repaired, 2005  . Shortness of breath   .  Palpitations     January, 2013    Past Surgical History  Procedure Laterality Date  . Back surgery    . Abdominal aortic aneurysm repair  04/06/04    Dr. [name]  . Cholecystectomy  11/26/02  . Pilonidal cyst excision  11/11    Patient Active Problem List   Diagnosis Date Noted  . Testicular pain 10/29/2013  . Tick bite 10/29/2013  . Palpitations   . Coronary artery disease   . Carotid artery disease   . Ejection fraction   . AAA (abdominal aortic aneurysm)   . Shortness of breath   . Atrial fibrillation   . Pulmonary nodules 04/02/2011  . COPD 04/02/2009  . HEMORRHOIDS, HX OF 04/02/2009  . HYPERLIPIDEMIA 06/25/2008  . HYPERTENSION 06/25/2008  . 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 All other systems are reviewed and are negative other than the history of present illness.  PHYSICAL EXAM  Patient is oriented to  person time and place. Affect is normal. He is well tanned from fishing. Head is atraumatic. Sclera and conjunctiva are normal. There is no jugulovenous distention. Lungs are clear. Respiratory effort is nonlabored. Cardiac exam reveals S1 and S2. There no clicks or significant murmurs. The abdomen is soft. There is no peripheral edema.  Filed Vitals:   12/22/13 1030  BP: 122/60  Pulse: 60  Height: 5\' 10"  (1.778 m)  Weight: 176 lb (79.833 kg)   EKG is done today and reviewed by me. There is normal sinus rhythm. There is no change from the past. There are no significant abnormalities.  ASSESSMENT & PLAN

## 2013-12-22 NOTE — Patient Instructions (Signed)
Your physician recommends that you continue on your current medications as directed. Please refer to the Current Medication list given to you today.  Your physician wants you to follow-up in: 1 year. You will receive a reminder letter in the mail two months in advance. If you don't receive a letter, please call our office to schedule the follow-up appointment.  

## 2013-12-22 NOTE — Assessment & Plan Note (Signed)
Coronary disease is stable. He is having no significant symptoms. No further workup needed. He is on aspirin and a statin.

## 2013-12-24 ENCOUNTER — Other Ambulatory Visit: Payer: Self-pay

## 2013-12-24 ENCOUNTER — Other Ambulatory Visit: Payer: Self-pay | Admitting: Cardiology

## 2013-12-24 MED ORDER — DILTIAZEM HCL ER COATED BEADS 240 MG PO CP24: 240.0000 mg | ORAL_CAPSULE | Freq: Every day | ORAL | Status: DC

## 2013-12-24 MED ORDER — METOPROLOL TARTRATE 50 MG PO TABS: 25.0000 mg | ORAL_TABLET | Freq: Two times a day (BID) | ORAL | Status: DC

## 2014-02-25 ENCOUNTER — Other Ambulatory Visit: Payer: Self-pay

## 2014-02-25 MED ORDER — DILTIAZEM HCL ER COATED BEADS 240 MG PO CP24: 240.0000 mg | ORAL_CAPSULE | Freq: Every day | ORAL | Status: DC

## 2014-02-25 MED ORDER — ROSUVASTATIN CALCIUM 20 MG PO TABS: ORAL_TABLET | ORAL | Status: DC

## 2014-02-28 ENCOUNTER — Other Ambulatory Visit: Payer: Self-pay

## 2014-02-28 MED ORDER — METOPROLOL TARTRATE 50 MG PO TABS: 25.0000 mg | ORAL_TABLET | Freq: Two times a day (BID) | ORAL | Status: DC

## 2014-04-30 DIAGNOSIS — Z23 Encounter for immunization: Secondary | ICD-10-CM | POA: Diagnosis not present

## 2014-05-24 ENCOUNTER — Other Ambulatory Visit: Payer: Self-pay | Admitting: Cardiology

## 2014-08-24 ENCOUNTER — Other Ambulatory Visit: Payer: Self-pay | Admitting: Cardiology

## 2014-09-06 DEATH — deceased

## 2014-11-17 ENCOUNTER — Other Ambulatory Visit: Payer: Self-pay

## 2014-11-17 MED ORDER — METOPROLOL TARTRATE 50 MG PO TABS: ORAL_TABLET | ORAL | Status: DC

## 2014-11-18 ENCOUNTER — Other Ambulatory Visit: Payer: Self-pay | Admitting: Cardiology

## 2014-12-24 ENCOUNTER — Ambulatory Visit (INDEPENDENT_AMBULATORY_CARE_PROVIDER_SITE_OTHER): Payer: Medicare Other | Admitting: Cardiology

## 2014-12-24 ENCOUNTER — Encounter: Payer: Self-pay | Admitting: Cardiology

## 2014-12-24 VITALS — BP 122/66 | HR 61 | Ht 70.0 in | Wt 177.0 lb

## 2014-12-24 DIAGNOSIS — I2510 Atherosclerotic heart disease of native coronary artery without angina pectoris: Secondary | ICD-10-CM | POA: Diagnosis not present

## 2014-12-24 DIAGNOSIS — I714 Abdominal aortic aneurysm, without rupture, unspecified: Secondary | ICD-10-CM

## 2014-12-24 DIAGNOSIS — R002 Palpitations: Secondary | ICD-10-CM | POA: Diagnosis not present

## 2014-12-24 DIAGNOSIS — I779 Disorder of arteries and arterioles, unspecified: Secondary | ICD-10-CM | POA: Diagnosis not present

## 2014-12-24 DIAGNOSIS — I739 Peripheral vascular disease, unspecified: Secondary | ICD-10-CM

## 2014-12-24 DIAGNOSIS — E785 Hyperlipidemia, unspecified: Secondary | ICD-10-CM

## 2014-12-24 DIAGNOSIS — I10 Essential (primary) hypertension: Secondary | ICD-10-CM

## 2014-12-24 NOTE — Assessment & Plan Note (Signed)
Abdominal aortic aneurysm was repaired in 2005. Further workup.

## 2014-12-24 NOTE — Patient Instructions (Signed)
Medication Instructions:  Same  Labwork: None  Testing/Procedures: None  Follow-Up: Your physician wants you to follow-up in: 1 year. You will receive a reminder letter in the mail two months in advance. If you don't receive a letter, please call our office to schedule the follow-up appointment.      

## 2014-12-24 NOTE — Assessment & Plan Note (Signed)
Coronary disease is stable. The last nuclear study was 2014. There was question of the possibility of some mild ischemia. He is not having any significant symptoms. No change in therapy.

## 2014-12-24 NOTE — Assessment & Plan Note (Signed)
He is receiving guidelines directed therapy.

## 2014-12-24 NOTE — Assessment & Plan Note (Signed)
Carotid disease was stable by Doppler March, 2015. Two-year follow-up was recommended then.

## 2014-12-24 NOTE — Assessment & Plan Note (Signed)
Blood pressures controlled. No change in therapy. 

## 2014-12-24 NOTE — Progress Notes (Signed)
Cardiology Office Note   Date:  12/24/2014   ID:  Eaton, Timothy Jan 06, 1941, MRN [id number]  PCP:  Viviana Simpler, MD  Cardiologist:  Dola Argyle, MD   Chief Complaint  Patient presents with  . Appointment    Follow-up coronary artery disease.      History of Present Illness: Timothy Eaton is a 74 y.o. male who presents today to follow-up coronary artery disease. He is feeling well. I saw him last May, 2015. His carotids are followed carefully. He had a nuclear scan in 2014 showing no definite ischemia. There was question of some mild ischemia, but it may been diaphragmatic attenuation. He has not had any significant symptoms.    Past Medical History  Diagnosis Date  . Coronary artery disease     Moderate, catheterization 2005, nuclear scan 9/09 no ischemia, EF 60%, echo  . Carotid artery disease     Minimal, doppler 2004, 0-39% bilateral, doppler 05/17/09 0-39% bilateral  . GERD (gastroesophageal reflux disease)   . Hemorrhoids   . Atrial fibrillation     Postoperative in past  . COPD (chronic obstructive pulmonary disease)   . Lung nodule     Followup by pulmonary in past  . ED (erectile dysfunction)   . Hyperlipidemia   . Hypertension   . Ejection fraction     EF 60% echo, September, 2009  . Palpitations     History palpitations in the past  . AAA (abdominal aortic aneurysm)     [name], Repaired, 2005  . Shortness of breath   . Palpitations     January, 2013    Past Surgical History  Procedure Laterality Date  . Back surgery    . Abdominal aortic aneurysm repair  04/06/04    Dr. [name]  . Cholecystectomy  11/26/02  . Pilonidal cyst excision  11/11    Patient Active Problem List   Diagnosis Date Noted  . Testicular pain 10/29/2013  . Tick bite 10/29/2013  . Palpitations   . Coronary artery disease   . Carotid artery disease   . Ejection fraction   . AAA (abdominal aortic aneurysm)   . Shortness of breath   . Atrial fibrillation   .  Pulmonary nodules 04/02/2011  . COPD 04/02/2009  . HEMORRHOIDS, HX OF 04/02/2009  . HYPERLIPIDEMIA 06/25/2008  . HYPERTENSION 06/25/2008  . ERECTILE DYSFUNCTION, ORGANIC 06/25/2008      Current Outpatient Prescriptions  Medication Sig Dispense Refill  . aspirin 81 MG EC tablet Take 81 mg by mouth daily.      . CRESTOR 20 MG tablet TAKE 1 TABLET BY MOUTH DAILY 90 tablet 2  . diltiazem (CARDIZEM CD) 240 MG 24 hr capsule TAKE 1 CAPSULE (240 MG TOTAL) BY MOUTH DAILY. 90 capsule 0  . metoprolol (LOPRESSOR) 50 MG tablet TAKE 0.5 TABLETS (25 MG TOTAL) BY MOUTH 2 (TWO) TIMES DAILY. 90 tablet 1   No current facility-administered medications for this visit.    Allergies:   Sulfamethoxazole-trimethoprim    Social History:  The patient  reports that he quit smoking about 12 years ago. His smokeless tobacco use includes Chew. He reports that he does not drink alcohol or use illicit drugs.   Family History:  The patient's family history includes Cancer in his sister; Diabetes in his mother; Heart attack in his father; Hypertension in his mother; Stroke in his mother and other.    ROS:  Please see the history of present illness.    Patient  denies fever, chills, headache, sweats, rash, change in vision, change in hearing, chest pain, cough, nausea or vomiting, urinary symptoms. All other systems are reviewed and are negative.    PHYSICAL EXAM: VS:  BP 122/66 mmHg  Pulse 61  Ht 5\' 10"  (1.778 m)  Wt 177 lb (80.287 kg)  BMI 25.40 kg/m2 , The patient looks great. He is oriented to person time and place. Affect is normal. Head is atraumatic. Sclera and conjunctiva are normal. There is no jugulovenous distention. Lungs are clear. Respiratory effort is not labored. Cardiac exam reveals an S1 and S2. The abdomen is soft. There is no peripheral edema. There are no musculoskeletal deformities. There are no skin rashes. Neurologic is grossly intact.  EKG:   EKG is done today and reviewed by me. The EKG is  normal. There is no change from the past.   Recent Labs: No results found for requested labs within last 365 days.    Lipid Panel    Component Value Date/Time   CHOL 110 04/02/2011 1624   TRIG 74.0 04/02/2011 1624   HDL 40.90 04/02/2011 1624   CHOLHDL 3 04/02/2011 1624   VLDL 14.8 04/02/2011 1624   LDLCALC 54 04/02/2011 1624      Wt Readings from Last 3 Encounters:  12/24/14 177 lb (80.287 kg)  12/22/13 176 lb (79.833 kg)  10/29/13 181 lb 4 oz (82.214 kg)      Current medicines are reviewed  The patient understands his medications.     ASSESSMENT AND PLAN:

## 2015-02-11 ENCOUNTER — Other Ambulatory Visit: Payer: Self-pay | Admitting: Cardiology

## 2015-02-13 ENCOUNTER — Other Ambulatory Visit: Payer: Self-pay | Admitting: Cardiology

## 2015-02-14 ENCOUNTER — Other Ambulatory Visit: Payer: Self-pay

## 2015-02-14 MED ORDER — ROSUVASTATIN CALCIUM 20 MG PO TABS: 20.0000 mg | ORAL_TABLET | Freq: Every day | ORAL | Status: DC

## 2015-05-23 ENCOUNTER — Other Ambulatory Visit: Payer: Self-pay

## 2015-05-23 MED ORDER — METOPROLOL TARTRATE 50 MG PO TABS: ORAL_TABLET | ORAL | Status: DC

## 2015-06-16 DIAGNOSIS — Z23 Encounter for immunization: Secondary | ICD-10-CM | POA: Diagnosis not present

## 2015-07-29 ENCOUNTER — Encounter: Payer: Self-pay | Admitting: Family Medicine

## 2015-07-29 ENCOUNTER — Ambulatory Visit (INDEPENDENT_AMBULATORY_CARE_PROVIDER_SITE_OTHER): Payer: Medicare Other | Admitting: Family Medicine

## 2015-07-29 VITALS — BP 124/78 | HR 85 | Temp 99.3°F | Wt 163.0 lb

## 2015-07-29 DIAGNOSIS — J441 Chronic obstructive pulmonary disease with (acute) exacerbation: Secondary | ICD-10-CM | POA: Diagnosis not present

## 2015-07-29 DIAGNOSIS — H6123 Impacted cerumen, bilateral: Secondary | ICD-10-CM

## 2015-07-29 DIAGNOSIS — I2510 Atherosclerotic heart disease of native coronary artery without angina pectoris: Secondary | ICD-10-CM | POA: Diagnosis not present

## 2015-07-29 MED ORDER — AZITHROMYCIN 250 MG PO TABS: ORAL_TABLET | ORAL | Status: DC

## 2015-07-29 NOTE — Assessment & Plan Note (Signed)
Bilateral ears irrigated without complication.

## 2015-07-29 NOTE — Assessment & Plan Note (Signed)
Change in sputum.. Treat wit antibiotics. No wheeze but SOB. Hold on prednisone for now unless not improving.

## 2015-07-29 NOTE — Patient Instructions (Signed)
Start flonase 2 sprays per nostril daily.  Complete antibiotics.  Mucinex DM twice daily for cough.  Call if shortness of breath is worsening or new feve on antibiotic.

## 2015-07-29 NOTE — Progress Notes (Signed)
Pre visit review using our clinic review tool, if applicable. No additional management support is needed unless otherwise documented below in the visit note. 

## 2015-07-29 NOTE — Progress Notes (Signed)
   Subjective:    Patient ID: Timothy Eaton, male    DOB: 06-15-1941, 74 y.o.   MRN: [id number]  Cough This is a new problem. The current episode started in the past 7 days. The problem has been gradually worsening. The cough is productive of sputum. Associated symptoms include chills, headaches, nasal congestion and wheezing. Pertinent negatives include no ear congestion, ear pain, fever, postnasal drip, sore throat or shortness of breath. The symptoms are aggravated by lying down. Risk factors for lung disease include smoking/tobacco exposure (former smoker). Treatments tried: tylenol, cough drops, menthol. The treatment provided mild relief. His past medical history is significant for COPD. There is no history of asthma, emphysema or environmental allergies.    Social History /Family History/Past Medical History reviewed and updated if needed.  COPD, mild  No recent antibitoics.    Review of Systems  Constitutional: Positive for chills. Negative for fever.  HENT: Negative for ear pain, postnasal drip and sore throat.   Respiratory: Positive for cough and wheezing. Negative for shortness of breath.   Allergic/Immunologic: Negative for environmental allergies.  Neurological: Positive for headaches.       Objective:   Physical Exam  Constitutional: Vital signs are normal. He appears well-developed and well-nourished.  Non-toxic appearance. He does not appear ill. No distress.  HENT:  Head: Normocephalic and atraumatic.  Right Ear: Hearing, external ear and ear canal normal. No tenderness. No foreign bodies. Tympanic membrane is not retracted and not bulging.  Left Ear: Hearing, tympanic membrane, external ear and ear canal normal. No tenderness. No foreign bodies. Tympanic membrane is not retracted and not bulging.  Nose: Mucosal edema and rhinorrhea present. Right sinus exhibits no maxillary sinus tenderness and no frontal sinus tenderness. Left sinus exhibits no maxillary sinus  tenderness and no frontal sinus tenderness.  Mouth/Throat: Uvula is midline, oropharynx is clear and moist and mucous membranes are normal. Normal dentition. No dental caries. No oropharyngeal exudate or tonsillar abscesses.  Cerumen impaction bilaterally.Margy Clarks.  Eyes: Conjunctivae, EOM and lids are normal. Pupils are equal, round, and reactive to light. Lids are everted and swept, no foreign bodies found.  Neck: Trachea normal, normal range of motion and phonation normal. Neck supple. Carotid bruit is not present. No thyroid mass and no thyromegaly present.  Cardiovascular: Normal rate, regular rhythm, S1 normal, S2 normal, normal heart sounds, intact distal pulses and normal pulses.  Exam reveals no gallop.   No murmur heard. Pulmonary/Chest: Effort normal and breath sounds normal. No respiratory distress. He has no wheezes. He has no rhonchi. He has no rales.  Abdominal: Soft. Normal appearance and bowel sounds are normal. There is no hepatosplenomegaly. There is no tenderness. There is no rebound, no guarding and no CVA tenderness. No hernia.  Neurological: He is alert. He has normal reflexes.  Skin: Skin is warm, dry and intact. No rash noted.  Psychiatric: He has a normal mood and affect. His speech is normal and behavior is normal. Judgment normal.          Assessment & Plan:

## 2015-08-04 ENCOUNTER — Encounter: Payer: Self-pay | Admitting: *Deleted

## 2015-09-30 ENCOUNTER — Other Ambulatory Visit: Payer: Self-pay | Admitting: Internal Medicine

## 2015-09-30 DIAGNOSIS — I6523 Occlusion and stenosis of bilateral carotid arteries: Secondary | ICD-10-CM

## 2015-10-05 ENCOUNTER — Ambulatory Visit: Payer: Medicare Other

## 2015-10-05 ENCOUNTER — Encounter (HOSPITAL_COMMUNITY): Payer: Medicare Other

## 2015-10-05 DIAGNOSIS — I6523 Occlusion and stenosis of bilateral carotid arteries: Secondary | ICD-10-CM | POA: Diagnosis not present

## 2015-10-07 ENCOUNTER — Telehealth: Payer: Self-pay | Admitting: Internal Medicine

## 2015-10-07 NOTE — Telephone Encounter (Signed)
Patient returned Shannon's call. °

## 2015-10-07 NOTE — Telephone Encounter (Signed)
See result notes. 

## 2015-11-15 ENCOUNTER — Other Ambulatory Visit: Payer: Self-pay | Admitting: Cardiology

## 2015-12-26 ENCOUNTER — Encounter: Payer: Self-pay | Admitting: Cardiovascular Disease

## 2015-12-26 ENCOUNTER — Ambulatory Visit (INDEPENDENT_AMBULATORY_CARE_PROVIDER_SITE_OTHER): Payer: Medicare Other | Admitting: Cardiovascular Disease

## 2015-12-26 VITALS — BP 116/82 | HR 57 | Ht 70.0 in | Wt 178.0 lb

## 2015-12-26 DIAGNOSIS — I2510 Atherosclerotic heart disease of native coronary artery without angina pectoris: Secondary | ICD-10-CM

## 2015-12-26 DIAGNOSIS — I6523 Occlusion and stenosis of bilateral carotid arteries: Secondary | ICD-10-CM

## 2015-12-26 DIAGNOSIS — I10 Essential (primary) hypertension: Secondary | ICD-10-CM | POA: Diagnosis not present

## 2015-12-26 LAB — LIPID PANEL
Cholesterol: 101 mg/dL — ABNORMAL LOW (ref 125–200)
HDL: 43 mg/dL (ref >=40)
LDL CALC: 42 mg/dL (ref <130)
Total CHOL/HDL Ratio: 2.3 Ratio (ref <=5.0)
Triglycerides: 80 mg/dL (ref <150)
VLDL: 16 mg/dL (ref <30)

## 2015-12-26 LAB — COMPREHENSIVE METABOLIC PANEL
ALT: 17 U/L (ref 9–46)
AST: 18 U/L (ref 10–35)
Albumin: 4.2 g/dL (ref 3.6–5.1)
Alkaline Phosphatase: 78 U/L (ref 40–115)
BUN: 19 mg/dL (ref 7–25)
CHLORIDE: 105 mmol/L (ref 98–110)
CO2: 26 mmol/L (ref 20–31)
CREATININE: 1.11 mg/dL (ref 0.70–1.18)
Calcium: 9.5 mg/dL (ref 8.6–10.3)
GLUCOSE: 82 mg/dL (ref 65–99)
POTASSIUM: 4.2 mmol/L (ref 3.5–5.3)
SODIUM: 142 mmol/L (ref 135–146)
TOTAL PROTEIN: 6.4 g/dL (ref 6.1–8.1)
Total Bilirubin: 0.4 mg/dL (ref 0.2–1.2)

## 2015-12-26 NOTE — Progress Notes (Signed)
Cardiology Office Note   Date:  12/26/2015   ID:  Timothy, Eaton Feb 04, 1941, MRN [id number]  PCP:  Viviana Simpler, MD  Cardiologist:  [name], MD   Chief Complaint  Patient presents with  . Coronary Artery Disease   Problem list 1. Coronary artery disease 2. Carotid artery stenosis - mild 3. Atrial fibrillation-postoperative 4. Essential hypertension 5. Hyperlipidemia 6. Abdominal aortic aneurysm repair   History of Present Illness: Timothy Eaton is a 75 y.o. male who presents today to follow-up coronary artery disease. He is feeling well. I saw him last May, 2015. His carotids are followed carefully. He had a nuclear scan in 2014 showing no definite ischemia. There was question of some mild ischemia, but it may been diaphragmatic attenuation. He has not had any significant symptoms.  Dec 26, 2015:  Complains of some right neck pain - especially in the afternoons.  Has not been active.    Retired from truck driving   Past Medical History  Diagnosis Date  . Coronary artery disease     Moderate, catheterization 2005, nuclear scan 9/09 no ischemia, EF 60%, echo  . Carotid artery disease ([place])     Minimal, doppler 2004, 0-39% bilateral, doppler 05/17/09 0-39% bilateral  . GERD (gastroesophageal reflux disease)   . Hemorrhoids   . Atrial fibrillation (HCC)     Postoperative in past  . COPD (chronic obstructive pulmonary disease) ([place])   . Lung nodule     Followup by pulmonary in past  . ED (erectile dysfunction)   . Hyperlipidemia   . Hypertension   . Ejection fraction     EF 60% echo, September, 2009  . Palpitations     History palpitations in the past  . AAA (abdominal aortic aneurysm) ([place])     [name], Repaired, 2005  . Shortness of breath   . Palpitations     January, 2013    Past Surgical History  Procedure Laterality Date  . Back surgery    . Abdominal aortic aneurysm repair  04/06/04    Dr. [name]  . Cholecystectomy  11/26/02  . Pilonidal  cyst excision  11/11    Patient Active Problem List   Diagnosis Date Noted  . COPD exacerbation ([place]) 07/29/2015  . Bilateral impacted cerumen 07/29/2015  . Testicular pain 10/29/2013  . Tick bite 10/29/2013  . Palpitations   . Coronary artery disease   . Carotid artery disease ([place])   . AAA (abdominal aortic aneurysm) ([place])   . Shortness of breath   . Atrial fibrillation ([place])   . Pulmonary nodules 04/02/2011  . COPD 04/02/2009  . HEMORRHOIDS, HX OF 04/02/2009  . Hyperlipidemia 06/25/2008  . Essential hypertension 06/25/2008  . ERECTILE DYSFUNCTION, ORGANIC 06/25/2008      Current Outpatient Prescriptions  Medication Sig Dispense Refill  . aspirin 81 MG EC tablet Take 81 mg by mouth daily.      Marland Kitchen diltiazem (CARDIZEM CD) 240 MG 24 hr capsule TAKE 1 CAPSULE (240 MG TOTAL) BY MOUTH DAILY. 90 capsule 0  . metoprolol (LOPRESSOR) 50 MG tablet TAKE 1/2 TABLET BY MOUTH 2 TIMES A DAY 90 tablet 0  . rosuvastatin (CRESTOR) 20 MG tablet TAKE 1 TABLET (20 MG TOTAL) BY MOUTH DAILY. 90 tablet 0   No current facility-administered medications for this visit.    Allergies:   Sulfamethoxazole-trimethoprim    Social History:  The patient  reports that he quit smoking about 13 years ago. His smokeless tobacco use includes  Chew. He reports that he does not drink alcohol or use illicit drugs.   Family History:  The patient's family history includes Cancer in his sister; Diabetes in his mother; Heart attack in his father; Hypertension in his mother; Stroke in his mother and other.    ROS:  Please see the history of present illness.    Patient denies fever, chills, headache, sweats, rash, change in vision, change in hearing, chest pain, cough, nausea or vomiting, urinary symptoms. All other systems are reviewed and are negative.    PHYSICAL EXAM: VS:  BP 116/82 mmHg  Pulse 57  Ht 5\' 10"  (1.778 m)  Wt 178 lb (80.74 kg)  BMI 25.54 kg/m2 , The patient looks great. He is oriented to person  time and place. Affect is normal. Head is atraumatic. Sclera and conjunctiva are normal. There is no jugulovenous distention. Lungs are clear. Respiratory effort is not labored. Cardiac exam reveals an S1 and S2. The abdomen is soft. There is no peripheral edema. There are no musculoskeletal deformities. There are no skin rashes. Neurologic is grossly intact.  EKG:   EKG is done today and reviewed by me. Sinus brady at 57.  No ST or T wave changes.    Recent Labs: No results found for requested labs within last 365 days.    Lipid Panel    Component Value Date/Time   CHOL 110 04/02/2011 1624   TRIG 74.0 04/02/2011 1624   HDL 40.90 04/02/2011 1624   CHOLHDL 3 04/02/2011 1624   VLDL 14.8 04/02/2011 1624   LDLCALC 54 04/02/2011 1624      Wt Readings from Last 3 Encounters:  12/26/15 178 lb (80.74 kg)  07/29/15 163 lb (73.936 kg)  12/24/14 177 lb (80.287 kg)      Current medicines are reviewed  The patient understands his medications.     ASSESSMENT AND PLAN:  1. Coronary artery disease - no angina .  Continue meds  2. Carotid artery stenosis - mild  3. Atrial fibrillation-postoperative  4. Essential hypertension Blood pressure is well controlled. Continue current medications.  5. Hyperlipidemia - we'll check fasting lipids today.  6. Abdominal aortic aneurysm repair    [name], MD  12/26/2015 4:17 PM    [facility] [place],  [address] [place], [place]  [address] Pager [phone] Phone: [phone]; Fax: [phone]

## 2015-12-26 NOTE — Patient Instructions (Signed)
Medication Instructions:  Your physician recommends that you continue on your current medications as directed. Please refer to the Current Medication list given to you today.   Labwork: TODAY - cholesterol, complete metabolic panel   Testing/Procedures: None Ordered   Follow-Up: Your physician wants you to follow-up in: 1 year with Dr. Nahser.  You will receive a reminder letter in the mail two months in advance. If you don't receive a letter, please call our office to schedule the follow-up appointment.   If you need a refill on your cardiac medications before your next appointment, please call your pharmacy.   Thank you for choosing [facility]! Mort Smelser, RN [phone]    

## 2016-02-17 ENCOUNTER — Other Ambulatory Visit: Payer: Self-pay | Admitting: *Deleted

## 2016-02-17 MED ORDER — DILTIAZEM HCL ER COATED BEADS 240 MG PO CP24: ORAL_CAPSULE | ORAL | Status: DC

## 2016-02-17 MED ORDER — METOPROLOL TARTRATE 50 MG PO TABS: ORAL_TABLET | ORAL | Status: DC

## 2016-02-17 MED ORDER — ROSUVASTATIN CALCIUM 20 MG PO TABS
ORAL_TABLET | ORAL | Status: DC
Start: 2016-02-17 — End: 2016-05-13

## 2016-05-01 DIAGNOSIS — Z23 Encounter for immunization: Secondary | ICD-10-CM | POA: Diagnosis not present

## 2016-05-02 ENCOUNTER — Telehealth: Payer: Self-pay | Admitting: Internal Medicine

## 2016-05-02 NOTE — Telephone Encounter (Signed)
Pt had flu shot, doesn't want pneumonia shot, js

## 2016-05-13 ENCOUNTER — Other Ambulatory Visit: Payer: Self-pay | Admitting: Cardiology

## 2016-11-17 ENCOUNTER — Other Ambulatory Visit: Payer: Self-pay | Admitting: Cardiovascular Disease

## 2016-11-22 ENCOUNTER — Encounter: Payer: Self-pay | Admitting: *Deleted

## 2016-12-14 ENCOUNTER — Encounter: Payer: Self-pay | Admitting: Cardiovascular Disease

## 2016-12-14 ENCOUNTER — Encounter (INDEPENDENT_AMBULATORY_CARE_PROVIDER_SITE_OTHER): Payer: Self-pay

## 2016-12-14 ENCOUNTER — Ambulatory Visit (INDEPENDENT_AMBULATORY_CARE_PROVIDER_SITE_OTHER): Payer: Medicare Other | Admitting: Cardiovascular Disease

## 2016-12-14 VITALS — BP 132/74 | HR 56 | Ht 71.0 in | Wt 177.8 lb

## 2016-12-14 DIAGNOSIS — E782 Mixed hyperlipidemia: Secondary | ICD-10-CM | POA: Diagnosis not present

## 2016-12-14 DIAGNOSIS — I10 Essential (primary) hypertension: Secondary | ICD-10-CM

## 2016-12-14 DIAGNOSIS — I2510 Atherosclerotic heart disease of native coronary artery without angina pectoris: Secondary | ICD-10-CM | POA: Diagnosis not present

## 2016-12-14 LAB — BASIC METABOLIC PANEL
BUN/Creatinine Ratio: 15 (ref 10–24)
BUN: 19 mg/dL (ref 8–27)
CO2: 25 mmol/L (ref 18–29)
Calcium: 9.5 mg/dL (ref 8.6–10.2)
Chloride: 106 mmol/L (ref 96–106)
Creatinine, Ser: 1.24 mg/dL (ref 0.76–1.27)
GFR calc Af Amer: 64 mL/min/{1.73_m2} (ref >59)
GFR calc non Af Amer: 56 mL/min/{1.73_m2} — ABNORMAL LOW (ref >59)
Glucose: 93 mg/dL (ref 65–99)
Potassium: 4 mmol/L (ref 3.5–5.2)
Sodium: 143 mmol/L (ref 134–144)

## 2016-12-14 LAB — LIPID PANEL
Chol/HDL Ratio: 2.5 ratio (ref 0.0–5.0)
Cholesterol, Total: 104 mg/dL (ref 100–199)
HDL: 41 mg/dL (ref >39)
LDL Calculated: 51 mg/dL (ref 0–99)
Triglycerides: 59 mg/dL (ref 0–149)
VLDL Cholesterol Cal: 12 mg/dL (ref 5–40)

## 2016-12-14 LAB — HEPATIC FUNCTION PANEL
ALK PHOS: 83 IU/L (ref 39–117)
ALT: 19 IU/L (ref 0–44)
AST: 21 IU/L (ref 0–40)
Albumin: 4.3 g/dL (ref 3.5–4.8)
Bilirubin Total: 0.4 mg/dL (ref 0.0–1.2)
Bilirubin, Direct: 0.16 mg/dL (ref 0.00–0.40)
Total Protein: 6.2 g/dL (ref 6.0–8.5)

## 2016-12-14 NOTE — Progress Notes (Signed)
Cardiology Office Note   Date:  12/14/2016   ID:  Timothy Eaton, DOB 03/16/1941, MRN [id number]  PCP:  [name], MD  Cardiologist:  [name], MD   Chief Complaint  Patient presents with  . Coronary Artery Disease   Problem list 1. Coronary artery disease 2. Carotid artery stenosis - mild 3. Atrial fibrillation-postoperative 4. Essential hypertension 5. Hyperlipidemia 6. Abdominal aortic aneurysm repair   Timothy Eaton is a 76 y.o. male who presents today to follow-up coronary artery disease. He is feeling well. I saw him last May, 2015. His carotids are followed carefully. He had a nuclear scan in 2014 showing no definite ischemia. There was question of some mild ischemia, but it may been diaphragmatic attenuation. He has not had any significant symptoms.  Dec 26, 2015:  Complains of some right neck pain - especially in the afternoons.  Has not been active.    Retired from truck driving   Dec 14, 996:  Timothy Eaton is seen today for follow up of his CAD  Has carotid disease - followed at [facility].  No CP or dyspnea  Some dyspnea.   Past Medical History:  Diagnosis Date  . AAA (abdominal aortic aneurysm) ([place])    [name], Repaired, 2005  . Atrial fibrillation (HCC)    Postoperative in past  . Carotid artery disease ([place])    Minimal, doppler 2004, 0-39% bilateral, doppler 05/17/09 0-39% bilateral  . COPD (chronic obstructive pulmonary disease) ([place])   . Coronary artery disease    Moderate, catheterization 2005, nuclear scan 9/09 no ischemia, EF 60%, echo  . ED (erectile dysfunction)   . Ejection fraction    EF 60% echo, September, 2009  . GERD (gastroesophageal reflux disease)   . Hemorrhoids   . Hyperlipidemia   . Hypertension   . Lung nodule    Followup by pulmonary in past  . Palpitations    History palpitations in the past  . Palpitations    January, 2013  . Shortness of breath     Past Surgical History:  Procedure Laterality Date  .  ABDOMINAL AORTIC ANEURYSM REPAIR  04/06/04   Dr. [name]  . BACK SURGERY    . CHOLECYSTECTOMY  11/26/02  . PILONIDAL CYST EXCISION  11/11    Patient Active Problem List   Diagnosis Date Noted  . COPD exacerbation ([place]) 07/29/2015  . Bilateral impacted cerumen 07/29/2015  . Testicular pain 10/29/2013  . Tick bite 10/29/2013  . Palpitations   . Coronary artery disease   . Carotid artery disease ([place])   . AAA (abdominal aortic aneurysm) ([place])   . Shortness of breath   . Atrial fibrillation ([place])   . Pulmonary nodules 04/02/2011  . COPD 04/02/2009  . HEMORRHOIDS, HX OF 04/02/2009  . Hyperlipidemia 06/25/2008  . Essential hypertension 06/25/2008  . ERECTILE DYSFUNCTION, ORGANIC 06/25/2008      Current Outpatient Prescriptions  Medication Sig Dispense Refill  . aspirin 81 MG EC tablet Take 81 mg by mouth daily.      Marland Kitchen CARTIA XT 240 MG 24 hr capsule take 1 capsule by mouth once daily 30 capsule 1  . metoprolol (LOPRESSOR) 50 MG tablet take 1/2 tablet by mouth twice a day 30 tablet 1  . rosuvastatin (CRESTOR) 20 MG tablet take 1 tablet by mouth once daily 30 tablet 1   No current facility-administered medications for this visit.     Allergies:   Sulfamethoxazole-trimethoprim    Social History:  The patient  reports that he quit smoking about 14 years ago. His smokeless tobacco use includes Chew. He reports that he does not drink alcohol or use drugs.   Family History:  The patient's family history includes Cancer in his sister; Diabetes in his mother; Heart attack in his father; Hypertension in his mother; Stroke in his mother and other.    ROS:  Please see the history of present illness.    Patient denies fever, chills, headache, sweats, rash, change in vision, change in hearing, chest pain, cough, nausea or vomiting, urinary symptoms. All other systems are reviewed and are negative.    PHYSICAL EXAM: VS:  BP 132/74   Pulse (!) 56   Ht 5\' 11"  (1.803 m)   Wt 177 lb 12 oz  (80.6 kg)   SpO2 95%   BMI 24.79 kg/m  , The patient looks great. He is oriented to person time and place. Affect is normal. Head is atraumatic. Sclera and conjunctiva are normal. There is no jugulovenous distention. Lungs are clear. Respiratory effort is not labored. Cardiac exam reveals an S1 and S2. The abdomen is soft. There is no peripheral edema. There are no musculoskeletal deformities. There are no skin rashes. Neurologic is grossly intact.  EKG:   EKG is done today and reviewed by me. Sinus brady at 56.  Otherwise normal ECG   Recent Labs: 12/26/2015: ALT 17; BUN 19; Creat 1.11; Potassium 4.2; Sodium 142    Lipid Panel    Component Value Date/Time   CHOL 101 (L) 12/26/2015 1622   TRIG 80 12/26/2015 1622   HDL 43 12/26/2015 1622   CHOLHDL 2.3 12/26/2015 1622   VLDL 16 12/26/2015 1622   LDLCALC 42 12/26/2015 1622      Wt Readings from Last 3 Encounters:  12/14/16 177 lb 12 oz (80.6 kg)  12/26/15 178 lb (80.7 kg)  07/29/15 163 lb (73.9 kg)      Current medicines are reviewed  The patient understands his medications.     ASSESSMENT AND PLAN:  1. Coronary artery disease - no angina .  Continue meds  2. Carotid artery stenosis - mild  3. Atrial fibrillation-postoperative  4. Essential hypertension Blood pressure is well controlled. Continue current medications.  5. Hyperlipidemia - we'll check fasting lipids today.  6. Abdominal aortic aneurysm repair - stable     [name], MD  12/14/2016 9:33 AM    [facility] [place],  [address] [place], [place]  [address] Pager [phone] Phone: [phone]; Fax: [phone]

## 2016-12-14 NOTE — Patient Instructions (Signed)
Medication Instructions: - Your physician recommends that you continue on your current medications as directed. Please refer to the Current Medication list given to you today.  Labwork: - Your physician recommends that you have lab work today: BMP/Liver/Lipid  Procedures/Testing: - none ordered  Follow-Up: - Your physician wants you to follow-up in: 1 year with Dr. Acie Fredrickson. You will receive a reminder letter in the mail two months in advance. If you don't receive a letter, please call our office to schedule the follow-up appointment.   Any Additional Special Instructions Will Be Listed Below (If Applicable).     If you need a refill on your cardiac medications before your next appointment, please call your pharmacy.

## 2017-02-15 ENCOUNTER — Other Ambulatory Visit: Payer: Self-pay | Admitting: Cardiovascular Disease

## 2017-02-22 ENCOUNTER — Other Ambulatory Visit: Payer: Self-pay

## 2017-04-09 ENCOUNTER — Ambulatory Visit (INDEPENDENT_AMBULATORY_CARE_PROVIDER_SITE_OTHER): Payer: Medicare Other | Admitting: Internal Medicine

## 2017-04-09 ENCOUNTER — Encounter: Payer: Self-pay | Admitting: Internal Medicine

## 2017-04-09 VITALS — BP 108/64 | HR 60 | Temp 97.3°F | Ht 68.0 in | Wt 170.0 lb

## 2017-04-09 DIAGNOSIS — I779 Disorder of arteries and arterioles, unspecified: Secondary | ICD-10-CM

## 2017-04-09 DIAGNOSIS — I10 Essential (primary) hypertension: Secondary | ICD-10-CM | POA: Diagnosis not present

## 2017-04-09 DIAGNOSIS — I714 Abdominal aortic aneurysm, without rupture, unspecified: Secondary | ICD-10-CM

## 2017-04-09 DIAGNOSIS — I2510 Atherosclerotic heart disease of native coronary artery without angina pectoris: Secondary | ICD-10-CM | POA: Diagnosis not present

## 2017-04-09 DIAGNOSIS — Z7189 Other specified counseling: Secondary | ICD-10-CM | POA: Diagnosis not present

## 2017-04-09 DIAGNOSIS — Z23 Encounter for immunization: Secondary | ICD-10-CM | POA: Diagnosis not present

## 2017-04-09 DIAGNOSIS — I480 Paroxysmal atrial fibrillation: Secondary | ICD-10-CM | POA: Diagnosis not present

## 2017-04-09 DIAGNOSIS — I739 Peripheral vascular disease, unspecified: Secondary | ICD-10-CM

## 2017-04-09 DIAGNOSIS — Z0000 Encounter for general adult medical examination without abnormal findings: Secondary | ICD-10-CM | POA: Diagnosis not present

## 2017-04-09 DIAGNOSIS — J439 Emphysema, unspecified: Secondary | ICD-10-CM

## 2017-04-09 LAB — COMPREHENSIVE METABOLIC PANEL
ALT: 13 U/L (ref 0–53)
AST: 15 U/L (ref 0–37)
Albumin: 4 g/dL (ref 3.5–5.2)
Alkaline Phosphatase: 61 U/L (ref 39–117)
BUN: 16 mg/dL (ref 6–23)
CALCIUM: 9.4 mg/dL (ref 8.4–10.5)
CHLORIDE: 108 meq/L (ref 96–112)
CO2: 30 meq/L (ref 19–32)
CREATININE: 1.37 mg/dL (ref 0.40–1.50)
GFR: 53.48 mL/min — ABNORMAL LOW (ref >60.00)
GLUCOSE: 126 mg/dL — AB (ref 70–99)
Potassium: 4.1 mEq/L (ref 3.5–5.1)
SODIUM: 144 meq/L (ref 135–145)
Total Bilirubin: 0.5 mg/dL (ref 0.2–1.2)
Total Protein: 6.1 g/dL (ref 6.0–8.3)

## 2017-04-09 LAB — CBC WITH DIFFERENTIAL/PLATELET
BASOS PCT: 0.4 % (ref 0.0–3.0)
Basophils Absolute: 0 10*3/uL (ref 0.0–0.1)
EOS ABS: 0.1 10*3/uL (ref 0.0–0.7)
Eosinophils Relative: 1.9 % (ref 0.0–5.0)
HEMATOCRIT: 47.5 % (ref 39.0–52.0)
Hemoglobin: 15.7 g/dL (ref 13.0–17.0)
LYMPHS ABS: 1.4 10*3/uL (ref 0.7–4.0)
Lymphocytes Relative: 25.4 % (ref 12.0–46.0)
MCHC: 33 g/dL (ref 30.0–36.0)
MCV: 92 fl (ref 78.0–100.0)
Monocytes Absolute: 0.5 10*3/uL (ref 0.1–1.0)
Monocytes Relative: 8.5 % (ref 3.0–12.0)
NEUTROS ABS: 3.4 10*3/uL (ref 1.4–7.7)
Neutrophils Relative %: 63.8 % (ref 43.0–77.0)
PLATELETS: 171 10*3/uL (ref 150.0–400.0)
RBC: 5.16 Mil/uL (ref 4.22–5.81)
RDW: 13.6 % (ref 11.5–15.5)
WBC: 5.4 10*3/uL (ref 4.0–10.5)

## 2017-04-09 LAB — LIPID PANEL
CHOL/HDL RATIO: 3
Cholesterol: 92 mg/dL (ref 0–200)
HDL: 35.7 mg/dL — ABNORMAL LOW (ref >39.00)
LDL Cholesterol: 41 mg/dL (ref 0–99)
NONHDL: 55.9
TRIGLYCERIDES: 74 mg/dL (ref 0.0–149.0)
VLDL: 14.8 mg/dL (ref 0.0–40.0)

## 2017-04-09 NOTE — Assessment & Plan Note (Signed)
See social history Blank forms done 

## 2017-04-09 NOTE — Assessment & Plan Note (Signed)
I have personally reviewed the Medicare Annual Wellness questionnaire and have noted  1. The patient's medical and social history  2. Their use of alcohol, tobacco or illicit drugs  3. Their current medications and supplements  4. The patient's functional ability including ADL's, fall risks, home safety risks and hearing or visual              impairment.  5. Diet and physical activities  6. Evidence for depression or mood disorders  The patients weight, height, BMI and visual acuity have been recorded in the chart  I have made referrals, counseling and provided education to the patient based review of the above and I have provided the pt with a written personalized care plan for preventive services.   I have provided you with a copy of your personalized plan for preventive services. Please take the time to review along with your updated medication list.  He prefers no cancer screening Discussed healthy lifestyle Prevnar and flu vaccines today Mild memory issues--not clear cut. Observation only

## 2017-04-09 NOTE — Progress Notes (Signed)
Subjective:    Patient ID: Timothy Eaton, male    DOB: [birth year], 76 y.o.   MRN: [id number]  HPI Here for Medicare wellness and follow up of chronic health conditions Reviewed form and advanced directives Reviewed other doctors No alcohol or tobacco now Vision is good--- reading glasses. Hearing loss per wife---he thinks it is fine No falls No depression or anhedonia Independent with instrumental ADLs Tries to stay active--needs to get back to his walking  Wife concerned that he is forgetting things Minor issues by his description (forgot if she gave him $20 one day --when he got something the next) No functional change  Has nasal drainage in the morning Has to blow his nose multiple times and sneezes Also notes some rhinorrhea if he comes into the cold Sleeps better on "My Pillow" Hasn't tried any meds  No regular cough Breathing is about the same Mows regularly and walks --though slacked off some No wheezing  No recent heart trouble Keeps up with cardiology No chest pain No palpitations No dizziness or syncope No edema  Hasn't seen Dr [name] for some time No leg pain or claudication Known carotid occlusions--but mild. Usually gets yearly ultrasound------but we have stopped this unless he has symptoms  Current Outpatient Prescriptions on File Prior to Visit  Medication Sig Dispense Refill  . aspirin 81 MG EC tablet Take 81 mg by mouth daily.      Marland Kitchen diltiazem (CARDIZEM CD) 240 MG 24 hr capsule take 1 capsule by mouth once daily 90 capsule 1  . metoprolol tartrate (LOPRESSOR) 50 MG tablet take 0.5 tablet by mouth twice a day 90 tablet 1  . rosuvastatin (CRESTOR) 20 MG tablet take 1 tablet by mouth once daily 90 tablet 1   No current facility-administered medications on file prior to visit.     Allergies  Allergen Reactions  . Sulfamethoxazole-Trimethoprim     REACTION: rash    Past Medical History:  Diagnosis Date  . AAA (abdominal aortic aneurysm) ([place])     [name], Repaired, 2005  . Atrial fibrillation (HCC)    Postoperative in past  . Carotid artery disease ([place])    Minimal, doppler 2004, 0-39% bilateral, doppler 05/17/09 0-39% bilateral  . COPD (chronic obstructive pulmonary disease) ([place])   . Coronary artery disease    Moderate, catheterization 2005, nuclear scan 9/09 no ischemia, EF 60%, echo  . ED (erectile dysfunction)   . Ejection fraction    EF 60% echo, September, 2009  . GERD (gastroesophageal reflux disease)   . Hemorrhoids   . Hyperlipidemia   . Hypertension   . Lung nodule    Followup by pulmonary in past  . Palpitations    History palpitations in the past  . Palpitations    January, 2013  . Shortness of breath     Past Surgical History:  Procedure Laterality Date  . ABDOMINAL AORTIC ANEURYSM REPAIR  04/06/04   Dr. [name]  . BACK SURGERY    . CHOLECYSTECTOMY  11/26/02  . PILONIDAL CYST EXCISION  11/11    Family History  Problem Relation Age of Onset  . Hypertension Mother        ?  . Stroke Mother        CVA  . Diabetes Mother        DM  . Heart attack Father        MI  . Cancer Sister        Gall bladder   . Stroke  Other        All mom's family died of strokes    Social History   Social History  . Marital status: Married    Spouse name: N/A  . Number of children: 3  . Years of education: N/A   Occupational History  . Truck driver     Retired   Social History Main Topics  . Smoking status: Former Smoker    Quit date: 08/06/2002  . Smokeless tobacco: Current User    Types: Chew  . Alcohol use No  . Drug use: No  . Sexual activity: Not on file   Other Topics Concern  . Not on file   Social History Narrative   No living will   Would want wife--then daughter Joelene Millin-- to make decisions for him.   Would accept resuscitation   No feeding tube if cognitively unaware   Review of Systems No headaches Full dentures--they are okay Appetite is good Weight is stable Sleeps okay--but  awakens in middle of night (able to get back to sleep) No trouble voiding and no nocturia Wears seat belt Some constipation--- uses OTC laxatives fairly regularly No back or joint pain Has spot on right eye and some black spots on chest he wants me to check    Objective:   Physical Exam  Constitutional: He is oriented to person, place, and time. He appears well-developed and well-nourished. No distress.  HENT:  Mouth/Throat: Oropharynx is clear and moist. No oropharyngeal exudate.  Neck: No thyromegaly present.  Cardiovascular: Normal rate, regular rhythm and normal heart sounds.  Exam reveals no gallop.   No murmur heard. Faint pedal pulses  Pulmonary/Chest: Effort normal. No respiratory distress. He has no wheezes. He has no rales.  Decreased breath sounds but clear  Abdominal: Soft. There is no tenderness.  Musculoskeletal: He exhibits no edema or tenderness.  Lymphadenopathy:    He has no cervical adenopathy.  Neurological: He is alert and oriented to person, place, and time.  President -- "Timothy Eaton, Timothy Eaton" 100-94     "I'm not good at numbers" Doesn't spell much Recall 3/3  Only 8th grade education  Skin: No rash noted. No erythema.  Seborrheic keratoses on eyelid and chest  Psychiatric: He has a normal mood and affect. His behavior is normal.          Assessment & Plan:

## 2017-04-09 NOTE — Assessment & Plan Note (Signed)
Mild  No Rx needed Stopped smoking years ago

## 2017-04-09 NOTE — Assessment & Plan Note (Signed)
Postop only No symptoms to suggest recurrence Beta blocker and asa

## 2017-04-09 NOTE — Addendum Note (Signed)
Addended by: Pilar Grammes on: 04/09/2017 10:05 AM   Modules accepted: Orders

## 2017-04-09 NOTE — Assessment & Plan Note (Signed)
Mild disease and no symptoms ASA and statin

## 2017-04-09 NOTE — Assessment & Plan Note (Signed)
BP Readings from Last 3 Encounters:  04/09/17 108/64  12/14/16 132/74  12/26/15 116/82   Good control on heart meds

## 2017-04-09 NOTE — Assessment & Plan Note (Signed)
Repair many years ago No ongoing surveillance and no symptoms of leak

## 2017-04-09 NOTE — Patient Instructions (Signed)
Please try miralax--- 1 capful in full glass of water daily--to keep your bowels more regular.

## 2017-11-06 ENCOUNTER — Other Ambulatory Visit: Payer: Self-pay | Admitting: *Deleted

## 2017-11-06 MED ORDER — ROSUVASTATIN CALCIUM 20 MG PO TABS: 20.0000 mg | ORAL_TABLET | Freq: Every day | ORAL | 0 refills | Status: DC

## 2017-11-06 MED ORDER — METOPROLOL TARTRATE 50 MG PO TABS: ORAL_TABLET | ORAL | 0 refills | Status: DC

## 2017-11-06 MED ORDER — DILTIAZEM HCL ER COATED BEADS 240 MG PO CP24: 240.0000 mg | ORAL_CAPSULE | Freq: Every day | ORAL | 0 refills | Status: DC

## 2017-12-11 ENCOUNTER — Other Ambulatory Visit: Payer: Self-pay

## 2017-12-11 MED ORDER — DILTIAZEM HCL ER COATED BEADS 240 MG PO CP24: 240.0000 mg | ORAL_CAPSULE | Freq: Every day | ORAL | 0 refills | Status: DC

## 2017-12-11 MED ORDER — ROSUVASTATIN CALCIUM 20 MG PO TABS: 20.0000 mg | ORAL_TABLET | Freq: Every day | ORAL | 0 refills | Status: DC

## 2018-01-03 ENCOUNTER — Other Ambulatory Visit: Payer: Self-pay

## 2018-01-03 MED ORDER — DILTIAZEM HCL ER COATED BEADS 240 MG PO CP24: 240.0000 mg | ORAL_CAPSULE | Freq: Every day | ORAL | 0 refills | Status: DC

## 2018-01-03 MED ORDER — METOPROLOL TARTRATE 50 MG PO TABS: ORAL_TABLET | ORAL | 0 refills | Status: DC

## 2018-01-03 MED ORDER — ROSUVASTATIN CALCIUM 20 MG PO TABS: 20.0000 mg | ORAL_TABLET | Freq: Every day | ORAL | 0 refills | Status: DC

## 2018-01-20 ENCOUNTER — Ambulatory Visit (INDEPENDENT_AMBULATORY_CARE_PROVIDER_SITE_OTHER): Payer: Medicare Other | Admitting: Cardiovascular Disease

## 2018-01-20 ENCOUNTER — Encounter: Payer: Self-pay | Admitting: Cardiovascular Disease

## 2018-01-20 VITALS — BP 112/76 | HR 64 | Ht 69.0 in | Wt 176.4 lb

## 2018-01-20 DIAGNOSIS — I2510 Atherosclerotic heart disease of native coronary artery without angina pectoris: Secondary | ICD-10-CM

## 2018-01-20 DIAGNOSIS — E782 Mixed hyperlipidemia: Secondary | ICD-10-CM | POA: Diagnosis not present

## 2018-01-20 DIAGNOSIS — I10 Essential (primary) hypertension: Secondary | ICD-10-CM | POA: Diagnosis not present

## 2018-01-20 LAB — LIPID PANEL
CHOL/HDL RATIO: 2.6 ratio (ref 0.0–5.0)
Cholesterol, Total: 104 mg/dL (ref 100–199)
HDL: 40 mg/dL (ref >39)
LDL CALC: 50 mg/dL (ref 0–99)
Triglycerides: 71 mg/dL (ref 0–149)
VLDL Cholesterol Cal: 14 mg/dL (ref 5–40)

## 2018-01-20 LAB — HEPATIC FUNCTION PANEL
ALT: 16 IU/L (ref 0–44)
AST: 16 IU/L (ref 0–40)
Albumin: 4.2 g/dL (ref 3.5–4.8)
Alkaline Phosphatase: 80 IU/L (ref 39–117)
BILIRUBIN TOTAL: 0.4 mg/dL (ref 0.0–1.2)
Bilirubin, Direct: 0.15 mg/dL (ref 0.00–0.40)
Total Protein: 6.2 g/dL (ref 6.0–8.5)

## 2018-01-20 LAB — BASIC METABOLIC PANEL
BUN/Creatinine Ratio: 19 (ref 10–24)
BUN: 22 mg/dL (ref 8–27)
CALCIUM: 9 mg/dL (ref 8.6–10.2)
CHLORIDE: 108 mmol/L — AB (ref 96–106)
CO2: 24 mmol/L (ref 20–29)
Creatinine, Ser: 1.16 mg/dL (ref 0.76–1.27)
GFR calc non Af Amer: 60 mL/min/{1.73_m2} (ref >59)
GFR, EST AFRICAN AMERICAN: 69 mL/min/{1.73_m2} (ref >59)
Glucose: 86 mg/dL (ref 65–99)
Potassium: 4.6 mmol/L (ref 3.5–5.2)
Sodium: 145 mmol/L — ABNORMAL HIGH (ref 134–144)

## 2018-01-20 MED ORDER — ROSUVASTATIN CALCIUM 20 MG PO TABS: 20.0000 mg | ORAL_TABLET | Freq: Every day | ORAL | 3 refills | Status: DC

## 2018-01-20 MED ORDER — DILTIAZEM HCL ER COATED BEADS 240 MG PO CP24: 240.0000 mg | ORAL_CAPSULE | Freq: Every day | ORAL | 3 refills | Status: DC

## 2018-01-20 MED ORDER — METOPROLOL TARTRATE 25 MG PO TABS: 25.0000 mg | ORAL_TABLET | Freq: Two times a day (BID) | ORAL | 3 refills | Status: DC

## 2018-01-20 NOTE — Patient Instructions (Signed)
Medication Instructions:  Your physician recommends that you continue on your current medications as directed. Please refer to the Current Medication list given to you today.   Labwork: TODAY - cholesterol, liver panel, basic metabolic panel   Testing/Procedures: None Ordered   Follow-Up: Your physician wants you to follow-up in: 1 year with Dr. Nahser.  You will receive a reminder letter in the mail two months in advance. If you don't receive a letter, please call our office to schedule the follow-up appointment.   If you need a refill on your cardiac medications before your next appointment, please call your pharmacy.   Thank you for choosing [facility]! Urijah Arko, RN [phone]    

## 2018-01-20 NOTE — Progress Notes (Signed)
Cardiology Office Note   Date:  01/20/2018   ID:  [name], DOB [birth year], MRN [id number]  PCP:  [name], MD  Cardiologist: former [name] , now  [name], MD   Chief Complaint  Patient presents with  . Coronary Artery Disease   Problem list 1. Coronary artery disease - mild CAD -  CAD ~ 2005  2. Carotid artery stenosis - mild 3. Atrial fibrillation-postoperative 4. Essential hypertension 5. Hyperlipidemia 6. Abdominal aortic aneurysm repair   Timothy BYARD is a 77 y.o. male who presents today to follow-up coronary artery disease. He is feeling well. I saw him last May, 2015. His carotids are followed carefully. He had a nuclear scan in 2014 showing no definite ischemia. There was question of some mild ischemia, but it may been diaphragmatic attenuation. He has not had any significant symptoms.  Dec 26, 2015:  Complains of some right neck pain - especially in the afternoons.  Has not been active.    Retired from truck driving   Dec 15, 7614:  [name] is seen today for follow up of his CAD  Has carotid disease - [facility].  No CP or dyspnea  Some dyspnea.  January 20, 2018:  Timothy Eaton is seen today for follow-up of his coronary artery disease and carotid disease.  He is followed at [facility] for his carotid disease. Doing well.  Not as much exercise.   Used to walk regularly . No CP , no dyspnea   Past Medical History:  Diagnosis Date  . AAA (abdominal aortic aneurysm) ([place])    [name], Repaired, 2005  . Atrial fibrillation (HCC)    Postoperative in past  . Carotid artery disease ([place])    Minimal, doppler 2004, 0-39% bilateral, doppler 05/17/09 0-39% bilateral  . COPD (chronic obstructive pulmonary disease) ([place])   . Coronary artery disease    Moderate, catheterization 2005, nuclear scan 9/09 no ischemia, EF 60%, echo  . ED (erectile dysfunction)   . Ejection fraction    EF 60% echo, September, 2009  . GERD (gastroesophageal reflux disease)   .  Hemorrhoids   . Hyperlipidemia   . Hypertension   . Lung nodule    Followup by pulmonary in past  . Palpitations    History palpitations in the past  . Palpitations    January, 2013  . Shortness of breath     Past Surgical History:  Procedure Laterality Date  . ABDOMINAL AORTIC ANEURYSM REPAIR  04/06/04   Dr. [name]  . BACK SURGERY    . CHOLECYSTECTOMY  11/26/02  . PILONIDAL CYST EXCISION  11/11    Patient Active Problem List   Diagnosis Date Noted  . Preventative health care 04/09/2017  . Advance directive discussed with patient 04/09/2017  . COPD exacerbation ([place]) 07/29/2015  . Coronary artery disease   . Carotid artery disease ([place])   . AAA (abdominal aortic aneurysm) ([place])   . Atrial fibrillation ([place])   . Pulmonary nodules 04/02/2011  . COPD (chronic obstructive pulmonary disease) with emphysema ([place]) 04/02/2009  . HEMORRHOIDS, HX OF 04/02/2009  . Hyperlipidemia 06/25/2008  . Essential hypertension 06/25/2008      Current Outpatient Medications  Medication Sig Dispense Refill  . aspirin 81 MG EC tablet Take 81 mg by mouth daily.      Marland Kitchen diltiazem (CARDIZEM CD) 240 MG 24 hr capsule Take 1 capsule (240 mg total) by mouth daily. Keep June 2019 follow up office visit to receive further  refills. Thank you. 30 capsule 0  . metoprolol tartrate (LOPRESSOR) 50 MG tablet Take 0.5 tablet by mouth twice a day.Please call and schedule an appointment for further refills 1st attempt 30 tablet 0  . rosuvastatin (CRESTOR) 20 MG tablet Take 1 tablet (20 mg total) by mouth daily. Keep June 2019 follow up office visit to receive further refills. Thank you. 30 tablet 0   No current facility-administered medications for this visit.     Allergies:   Sulfamethoxazole-trimethoprim    Social History:  The patient  reports that he quit smoking about 15 years ago. His smokeless tobacco use includes chew. He reports that he does not drink alcohol or use drugs.   Family History:  The  patient's family history includes Cancer in his sister; Diabetes in his mother; Heart attack in his father; Hypertension in his mother; Stroke in his mother and other.    ROS:  Please see the history of present illness.    Patient denies fever, chills, headache, sweats, rash, change in vision, change in hearing, chest pain, cough, nausea or vomiting, urinary symptoms. All other systems are reviewed and are negative.   Physical Exam: Blood pressure 112/76, pulse 64, height 5\' 9"  (1.753 m), weight 176 lb 6.4 oz (80 kg), SpO2 95 %.  GEN:  Well nourished, well developed in no acute distress HEENT: Normal NECK: No JVD; No carotid bruits LYMPHATICS: No lymphadenopathy CARDIAC: RR, no murmurs, rubs, gallops RESPIRATORY:  Clear to auscultation without rales, wheezing or rhonchi  ABDOMEN: Soft, non-tender, non-distended MUSCULOSKELETAL:  No edema; No deformity  SKIN: Warm and dry NEUROLOGIC:  Alert and oriented x 3  EKG:    January 20, 2018: Normal sinus rhythm at 64.  No ST or T wave changes.   Recent Labs: 04/09/2017: ALT 13; BUN 16; Creatinine, Ser 1.37; Hemoglobin 15.7; Platelets 171.0; Potassium 4.1; Sodium 144    Lipid Panel    Component Value Date/Time   CHOL 92 04/09/2017 1008   CHOL 104 12/14/2016 0949   TRIG 74.0 04/09/2017 1008   HDL 35.70 (L) 04/09/2017 1008   HDL 41 12/14/2016 0949   CHOLHDL 3 04/09/2017 1008   VLDL 14.8 04/09/2017 1008   LDLCALC 41 04/09/2017 1008   LDLCALC 51 12/14/2016 0949      Wt Readings from Last 3 Encounters:  01/20/18 176 lb 6.4 oz (80 kg)  04/09/17 170 lb (77.1 kg)  12/14/16 177 lb 12 oz (80.6 kg)      Current medicines are reviewed  The patient understands his medications.    ASSESSMENT AND PLAN:  1. Coronary artery disease -he is not having any episodes of angina.  Continue current meds  2. Carotid artery stenosis - mild  3. Atrial fibrillation-postoperative  4. Essential hypertension Blood pressure is well controlled. Continue  current medications.  5. Hyperlipidemia - we'll check fasting lipids today.  6. Abdominal aortic aneurysm repair - stable     [name], MD  01/20/2018 10:18 AM    [place] [address],  [address], [place]  [address] Pager [phone] Phone: [phone]; Fax: [phone]

## 2018-04-23 ENCOUNTER — Encounter: Payer: Medicare Other | Admitting: Internal Medicine

## 2018-05-27 DIAGNOSIS — Z23 Encounter for immunization: Secondary | ICD-10-CM | POA: Diagnosis not present

## 2018-10-08 DIAGNOSIS — H9190 Unspecified hearing loss, unspecified ear: Secondary | ICD-10-CM | POA: Diagnosis not present

## 2019-01-05 ENCOUNTER — Telehealth: Payer: Self-pay | Admitting: Nurse Practitioner

## 2019-01-05 NOTE — Telephone Encounter (Signed)
Left message for patient to call back regarding appointment with Dr. Acie Fredrickson on 6/9

## 2019-01-07 NOTE — Telephone Encounter (Signed)
Spoke with patient who states he would like to be seen in the office because he only sees Dr. Acie Fredrickson once per year and does not want to do virtual visit. I rescheduled patient's appointment for July 14, Dr. Elmarie Shiley next day in the office. Patient was grateful for my call.

## 2019-01-13 ENCOUNTER — Ambulatory Visit: Payer: Medicare Other | Admitting: Cardiovascular Disease

## 2019-01-15 ENCOUNTER — Other Ambulatory Visit: Payer: Self-pay | Admitting: Cardiovascular Disease

## 2019-01-28 ENCOUNTER — Other Ambulatory Visit: Payer: Self-pay | Admitting: Cardiovascular Disease

## 2019-02-16 ENCOUNTER — Telehealth: Payer: Self-pay | Admitting: Cardiovascular Disease

## 2019-02-16 NOTE — Telephone Encounter (Signed)

## 2019-02-17 ENCOUNTER — Ambulatory Visit (INDEPENDENT_AMBULATORY_CARE_PROVIDER_SITE_OTHER): Payer: Medicare Other | Admitting: Cardiovascular Disease

## 2019-02-17 ENCOUNTER — Encounter: Payer: Self-pay | Admitting: Cardiovascular Disease

## 2019-02-17 ENCOUNTER — Other Ambulatory Visit: Payer: Self-pay

## 2019-02-17 ENCOUNTER — Encounter (INDEPENDENT_AMBULATORY_CARE_PROVIDER_SITE_OTHER): Payer: Self-pay

## 2019-02-17 VITALS — BP 122/74 | HR 59 | Ht 69.0 in | Wt 169.8 lb

## 2019-02-17 DIAGNOSIS — E782 Mixed hyperlipidemia: Secondary | ICD-10-CM

## 2019-02-17 DIAGNOSIS — I10 Essential (primary) hypertension: Secondary | ICD-10-CM | POA: Diagnosis not present

## 2019-02-17 DIAGNOSIS — I2510 Atherosclerotic heart disease of native coronary artery without angina pectoris: Secondary | ICD-10-CM

## 2019-02-17 LAB — BASIC METABOLIC PANEL
BUN/Creatinine Ratio: 13 (ref 10–24)
BUN: 17 mg/dL (ref 8–27)
CO2: 24 mmol/L (ref 20–29)
Calcium: 9.5 mg/dL (ref 8.6–10.2)
Chloride: 105 mmol/L (ref 96–106)
Creatinine, Ser: 1.35 mg/dL — ABNORMAL HIGH (ref 0.76–1.27)
GFR calc Af Amer: 57 mL/min/{1.73_m2} — ABNORMAL LOW (ref >59)
GFR calc non Af Amer: 50 mL/min/{1.73_m2} — ABNORMAL LOW (ref >59)
Glucose: 92 mg/dL (ref 65–99)
Potassium: 4.4 mmol/L (ref 3.5–5.2)
Sodium: 146 mmol/L — ABNORMAL HIGH (ref 134–144)

## 2019-02-17 LAB — HEPATIC FUNCTION PANEL
ALT: 16 IU/L (ref 0–44)
AST: 18 IU/L (ref 0–40)
Albumin: 4.4 g/dL (ref 3.7–4.7)
Alkaline Phosphatase: 98 IU/L (ref 39–117)
Bilirubin Total: 0.4 mg/dL (ref 0.0–1.2)
Bilirubin, Direct: 0.14 mg/dL (ref 0.00–0.40)
Total Protein: 6.3 g/dL (ref 6.0–8.5)

## 2019-02-17 LAB — LIPID PANEL
Chol/HDL Ratio: 2.3 ratio (ref 0.0–5.0)
Cholesterol, Total: 103 mg/dL (ref 100–199)
HDL: 45 mg/dL (ref >39)
LDL Calculated: 48 mg/dL (ref 0–99)
Triglycerides: 52 mg/dL (ref 0–149)
VLDL Cholesterol Cal: 10 mg/dL (ref 5–40)

## 2019-02-17 NOTE — Progress Notes (Signed)
Cardiology Office Note   Date:  02/17/2019   ID:  Timothy, Eaton [birth year], MRN [id number]  PCP:  [name], MD  Cardiologist: former [name] , now  [name], MD   Chief Complaint  Patient presents with  . Coronary Artery Disease   Problem list 1. Coronary artery disease - mild CAD -  CAD ~ 2005  2. Carotid artery stenosis - mild 3. Atrial fibrillation-postoperative 4. Essential hypertension 5. Hyperlipidemia 6. Abdominal aortic aneurysm repair   Timothy Eaton is a 78 y.o. male who presents today to follow-up coronary artery disease. He is feeling well. I saw him last May, 2015. His carotids are followed carefully. He had a nuclear scan in 2014 showing no definite ischemia. There was question of some mild ischemia, but it may been diaphragmatic attenuation. He has not had any significant symptoms.  Dec 26, 2015:  Complains of some right neck pain - especially in the afternoons.  Has not been active.    Retired from truck driving   Dec 15, 9019:  Timothy Eaton is seen today for follow up of his CAD  Has carotid disease - [facility].  No CP or dyspnea  Some dyspnea.  January 20, 2018:  Timothy Eaton is seen today for follow-up of his coronary artery disease and carotid disease.  [facility] is [facility] for his [facility] disease. Doing well.  Not as much exercise.   Used to walk regularly . No CP , no dyspnea   February 17, 2019 : Doing well.  Very active , likes to fish.    No CP or dyspnea.  Some DOE if he does something too strenuous .     Past Medical History:  Diagnosis Date  . AAA (abdominal aortic aneurysm) ([place])    [name], Repaired, 2005  . Atrial fibrillation (HCC)    Postoperative in past  . Carotid artery disease ([place])    Minimal, doppler 2004, 0-39% bilateral, doppler 05/17/09 0-39% bilateral  . COPD (chronic obstructive pulmonary disease) ([place])   . Coronary artery disease    Moderate, catheterization 2005, nuclear scan 9/09 no ischemia, EF 60%, echo   . ED (erectile dysfunction)   . Ejection fraction    EF 60% echo, September, 2009  . GERD (gastroesophageal reflux disease)   . Hemorrhoids   . Hyperlipidemia   . Hypertension   . Lung nodule    Followup by pulmonary in past  . Palpitations    History palpitations in the past  . Palpitations    January, 2013  . Shortness of breath     Past Surgical History:  Procedure Laterality Date  . ABDOMINAL AORTIC ANEURYSM REPAIR  04/06/04   Dr. [name]  . BACK SURGERY    . CHOLECYSTECTOMY  11/26/02  . PILONIDAL CYST EXCISION  11/11    Patient Active Problem List   Diagnosis Date Noted  . Preventative health care 04/09/2017  . Advance directive discussed with patient 04/09/2017  . COPD exacerbation ([place]) 07/29/2015  . Coronary artery disease   . Carotid artery disease ([place])   . AAA (abdominal aortic aneurysm) ([place])   . Atrial fibrillation ([place])   . Pulmonary nodules 04/02/2011  . COPD (chronic obstructive pulmonary disease) with emphysema ([place]) 04/02/2009  . HEMORRHOIDS, HX OF 04/02/2009  . Hyperlipidemia 06/25/2008  . Essential hypertension 06/25/2008      Current Outpatient Medications  Medication Sig Dispense Refill  . aspirin 81 MG EC tablet Take 81 mg by mouth  daily.      . diltiazem (CARDIZEM CD) 240 MG 24 hr capsule Take 1 capsule (240 mg total) by mouth daily. Please keep upcoming appt in July with Dr. Acie Fredrickson for future refills. Thank you 90 capsule 0  . metoprolol tartrate (LOPRESSOR) 25 MG tablet Take 1 tablet (25 mg total) by mouth 2 (two) times daily. Please keep upcoming appt in July with Dr. Acie Fredrickson for future refills. Thank you 180 tablet 0  . rosuvastatin (CRESTOR) 20 MG tablet TAKE 1 TABLET BY MOUTH ONCE DAILY 90 tablet 0   No current facility-administered medications for this visit.     Allergies:   Sulfamethoxazole-trimethoprim    Social History:  The patient  reports that he quit smoking about 16 years ago. His smokeless tobacco use includes chew. He  reports that he does not drink alcohol or use drugs.   Family History:  The patient's family history includes Cancer in his sister; Diabetes in his mother; Heart attack in his father; Hypertension in his mother; Stroke in his mother and another family member.    ROS:  Please see the history of present illness.    Patient denies fever, chills, headache, sweats, rash, change in vision, change in hearing, chest pain, cough, nausea or vomiting, urinary symptoms. All other systems are reviewed and are negative.    Physical Exam: Blood pressure 122/74, pulse (!) 59, height 5\' 9"  (1.753 m), weight 169 lb 12.8 oz (77 kg), SpO2 96 %.  GEN:  Well nourished, well developed in no acute distress HEENT: Normal NECK: No JVD; No carotid bruits LYMPHATICS: No lymphadenopathy CARDIAC: RRR RESPIRATORY:  Clear to auscultation without rales, wheezing or rhonchi  ABDOMEN: Soft, non-tender, non-distended MUSCULOSKELETAL:  No edema; No deformity  SKIN: Warm and dry NEUROLOGIC:  Alert and oriented x 3  EKG:       Recent Labs: No results found for requested labs within last 8760 hours.    Lipid Panel    Component Value Date/Time   CHOL 104 01/20/2018 1037   TRIG 71 01/20/2018 1037   HDL 40 01/20/2018 1037   CHOLHDL 2.6 01/20/2018 1037   CHOLHDL 3 04/09/2017 1008   VLDL 14.8 04/09/2017 1008   LDLCALC 50 01/20/2018 1037      Wt Readings from Last 3 Encounters:  02/17/19 169 lb 12.8 oz (77 kg)  01/20/18 176 lb 6.4 oz (80 kg)  04/09/17 170 lb (77.1 kg)      Current medicines are reviewed  The patient understands his medications.    ASSESSMENT AND PLAN:  1. Coronary artery disease -   No angina   2. Carotid artery stenosis -  Followed by [facility]      4. Essential hypertension-  BP is well controlled.   5. Hyperlipidemia -  Check labs today ,  Cont. crestor   6. Abdominal aortic aneurysm repair - stable     [name], MD  02/17/2019 10:16 AM    [place]  [address],  [address], [place]  [address] Pager [phone] Phone: [phone]; Fax: [phone]

## 2019-02-17 NOTE — Patient Instructions (Signed)
Medication Instructions:  Your physician recommends that you continue on your current medications as directed. Please refer to the Current Medication list given to you today.  If you need a refill on your cardiac medications before your next appointment, please call your pharmacy.   Lab work: TODAY - cholesterol, liver panel, basic metabolic panel  If you have labs (blood work) drawn today and your tests are completely normal, you will receive your results only by: . MyChart Message (if you have MyChart) OR . A paper copy in the mail If you have any lab test that is abnormal or we need to change your treatment, we will call you to review the results.  Testing/Procedures: None Ordered   Follow-Up: At [facility], you and your health needs are our priority.  As part of our continuing mission to provide you with exceptional heart care, we have created designated Provider Care Teams.  These Care Teams include your primary Cardiologist (physician) and Advanced Practice Providers (APPs -  Physician Assistants and Nurse Practitioners) who all work together to provide you with the care you need, when you need it. You will need a follow up appointment in:  1 years.  Please call our office 2 months in advance to schedule this appointment.  You may see Philip Nahser, MD or one of the following Advanced Practice Providers on your designated Care Team: Scott Weaver, PA-C Vin Bhagat, PA-C . Janine Hammond, NP     

## 2019-04-12 ENCOUNTER — Other Ambulatory Visit: Payer: Self-pay | Admitting: Cardiovascular Disease

## 2019-04-27 DIAGNOSIS — Z23 Encounter for immunization: Secondary | ICD-10-CM | POA: Diagnosis not present

## 2019-04-28 ENCOUNTER — Other Ambulatory Visit: Payer: Self-pay | Admitting: Cardiovascular Disease

## 2019-06-29 ENCOUNTER — Emergency Department: Payer: Medicare Other

## 2019-06-29 ENCOUNTER — Telehealth: Payer: Self-pay | Admitting: Internal Medicine

## 2019-06-29 ENCOUNTER — Other Ambulatory Visit: Payer: Self-pay

## 2019-06-29 ENCOUNTER — Inpatient Hospital Stay
Admission: EM | Admit: 2019-06-29 | Discharge: 2019-07-01 | DRG: 871 | Disposition: A | Discharge status: Home / Self Care | Payer: Medicare Other | Attending: Internal Medicine | Admitting: Internal Medicine

## 2019-06-29 DIAGNOSIS — G9341 Metabolic encephalopathy: Secondary | ICD-10-CM

## 2019-06-29 DIAGNOSIS — R110 Nausea: Secondary | ICD-10-CM | POA: Diagnosis not present

## 2019-06-29 DIAGNOSIS — R509 Fever, unspecified: Secondary | ICD-10-CM | POA: Diagnosis not present

## 2019-06-29 DIAGNOSIS — I4891 Unspecified atrial fibrillation: Secondary | ICD-10-CM | POA: Diagnosis present

## 2019-06-29 DIAGNOSIS — I480 Paroxysmal atrial fibrillation: Secondary | ICD-10-CM | POA: Diagnosis not present

## 2019-06-29 DIAGNOSIS — Z79899 Other long term (current) drug therapy: Secondary | ICD-10-CM

## 2019-06-29 DIAGNOSIS — E785 Hyperlipidemia, unspecified: Secondary | ICD-10-CM | POA: Diagnosis present

## 2019-06-29 DIAGNOSIS — N3000 Acute cystitis without hematuria: Secondary | ICD-10-CM | POA: Diagnosis not present

## 2019-06-29 DIAGNOSIS — I25119 Atherosclerotic heart disease of native coronary artery with unspecified angina pectoris: Secondary | ICD-10-CM | POA: Diagnosis present

## 2019-06-29 DIAGNOSIS — R778 Other specified abnormalities of plasma proteins: Secondary | ICD-10-CM | POA: Diagnosis present

## 2019-06-29 DIAGNOSIS — R0902 Hypoxemia: Secondary | ICD-10-CM | POA: Diagnosis not present

## 2019-06-29 DIAGNOSIS — I2510 Atherosclerotic heart disease of native coronary artery without angina pectoris: Secondary | ICD-10-CM | POA: Diagnosis present

## 2019-06-29 DIAGNOSIS — Z8679 Personal history of other diseases of the circulatory system: Secondary | ICD-10-CM | POA: Diagnosis not present

## 2019-06-29 DIAGNOSIS — Z882 Allergy status to sulfonamides status: Secondary | ICD-10-CM | POA: Diagnosis not present

## 2019-06-29 DIAGNOSIS — N390 Urinary tract infection, site not specified: Secondary | ICD-10-CM | POA: Diagnosis present

## 2019-06-29 DIAGNOSIS — R112 Nausea with vomiting, unspecified: Secondary | ICD-10-CM | POA: Diagnosis not present

## 2019-06-29 DIAGNOSIS — I739 Peripheral vascular disease, unspecified: Secondary | ICD-10-CM | POA: Diagnosis present

## 2019-06-29 DIAGNOSIS — Z823 Family history of stroke: Secondary | ICD-10-CM | POA: Diagnosis not present

## 2019-06-29 DIAGNOSIS — Z87891 Personal history of nicotine dependence: Secondary | ICD-10-CM | POA: Diagnosis not present

## 2019-06-29 DIAGNOSIS — R1111 Vomiting without nausea: Secondary | ICD-10-CM | POA: Diagnosis not present

## 2019-06-29 DIAGNOSIS — Z8249 Family history of ischemic heart disease and other diseases of the circulatory system: Secondary | ICD-10-CM

## 2019-06-29 DIAGNOSIS — R0689 Other abnormalities of breathing: Secondary | ICD-10-CM | POA: Diagnosis not present

## 2019-06-29 DIAGNOSIS — A499 Bacterial infection, unspecified: Secondary | ICD-10-CM | POA: Diagnosis not present

## 2019-06-29 DIAGNOSIS — R7989 Other specified abnormal findings of blood chemistry: Secondary | ICD-10-CM | POA: Diagnosis present

## 2019-06-29 DIAGNOSIS — R55 Syncope and collapse: Secondary | ICD-10-CM | POA: Diagnosis not present

## 2019-06-29 DIAGNOSIS — Z20828 Contact with and (suspected) exposure to other viral communicable diseases: Secondary | ICD-10-CM | POA: Diagnosis present

## 2019-06-29 DIAGNOSIS — Z7982 Long term (current) use of aspirin: Secondary | ICD-10-CM

## 2019-06-29 DIAGNOSIS — J439 Emphysema, unspecified: Secondary | ICD-10-CM | POA: Diagnosis present

## 2019-06-29 DIAGNOSIS — A419 Sepsis, unspecified organism: Principal | ICD-10-CM | POA: Diagnosis present

## 2019-06-29 DIAGNOSIS — Z9049 Acquired absence of other specified parts of digestive tract: Secondary | ICD-10-CM | POA: Diagnosis not present

## 2019-06-29 DIAGNOSIS — I10 Essential (primary) hypertension: Secondary | ICD-10-CM | POA: Diagnosis present

## 2019-06-29 DIAGNOSIS — R300 Dysuria: Secondary | ICD-10-CM | POA: Diagnosis not present

## 2019-06-29 LAB — BASIC METABOLIC PANEL
Anion gap: 13 (ref 5–15)
Anion gap: 9 (ref 5–15)
BUN: 26 mg/dL — ABNORMAL HIGH (ref 8–23)
BUN: 25 mg/dL — ABNORMAL HIGH (ref 8–23)
CO2: 20 mmol/L — ABNORMAL LOW (ref 22–32)
CO2: 24 mmol/L (ref 22–32)
Calcium: 8.9 mg/dL (ref 8.9–10.3)
Calcium: 8.4 mg/dL — ABNORMAL LOW (ref 8.9–10.3)
Chloride: 104 mmol/L (ref 98–111)
Chloride: 105 mmol/L (ref 98–111)
Creatinine, Ser: 1.34 mg/dL — ABNORMAL HIGH (ref 0.61–1.24)
Creatinine, Ser: 1.35 mg/dL — ABNORMAL HIGH (ref 0.61–1.24)
GFR calc Af Amer: 58 mL/min — ABNORMAL LOW (ref >60)
GFR calc Af Amer: 57 mL/min — ABNORMAL LOW (ref >60)
GFR calc non Af Amer: 50 mL/min — ABNORMAL LOW (ref >60)
GFR calc non Af Amer: 50 mL/min — ABNORMAL LOW (ref >60)
Glucose, Bld: 146 mg/dL — ABNORMAL HIGH (ref 70–99)
Glucose, Bld: 149 mg/dL — ABNORMAL HIGH (ref 70–99)
Potassium: 3.6 mmol/L (ref 3.5–5.1)
Potassium: 3.8 mmol/L (ref 3.5–5.1)
Sodium: 137 mmol/L (ref 135–145)
Sodium: 138 mmol/L (ref 135–145)

## 2019-06-29 LAB — URINALYSIS, COMPLETE (UACMP) WITH MICROSCOPIC
Bacteria, UA: NONE SEEN
Bilirubin Urine: NEGATIVE
Glucose, UA: NEGATIVE mg/dL
Ketones, ur: 20 mg/dL — AB
Nitrite: POSITIVE — AB
Protein, ur: 100 mg/dL — AB
Specific Gravity, Urine: 1.026 (ref 1.005–1.030)
WBC, UA: >50 WBC/hpf — ABNORMAL HIGH (ref 0–5)
pH: 5 (ref 5.0–8.0)

## 2019-06-29 LAB — PROTIME-INR
INR: 1.2 (ref 0.8–1.2)
Prothrombin Time: 15.5 seconds — ABNORMAL HIGH (ref 11.4–15.2)

## 2019-06-29 LAB — LIPASE, BLOOD: Lipase: 28 U/L (ref 11–51)

## 2019-06-29 LAB — CBC WITH DIFFERENTIAL/PLATELET
Abs Immature Granulocytes: 0.26 10*3/uL — ABNORMAL HIGH (ref 0.00–0.07)
Basophils Absolute: 0 10*3/uL (ref 0.0–0.1)
Basophils Relative: 0 %
Eosinophils Absolute: 0 10*3/uL (ref 0.0–0.5)
Eosinophils Relative: 0 %
HCT: 41.3 % (ref 39.0–52.0)
Hemoglobin: 14.2 g/dL (ref 13.0–17.0)
Immature Granulocytes: 5 %
Lymphocytes Relative: 5 %
Lymphs Abs: 0.2 10*3/uL — ABNORMAL LOW (ref 0.7–4.0)
MCH: 30.2 pg (ref 26.0–34.0)
MCHC: 34.4 g/dL (ref 30.0–36.0)
MCV: 87.9 fL (ref 80.0–100.0)
Monocytes Absolute: 0.1 10*3/uL (ref 0.1–1.0)
Monocytes Relative: 2 %
Neutro Abs: 4.5 10*3/uL (ref 1.7–7.7)
Neutrophils Relative %: 88 %
Platelets: 156 10*3/uL (ref 150–400)
RBC: 4.7 MIL/uL (ref 4.22–5.81)
RDW: 12.8 % (ref 11.5–15.5)
WBC: 5.1 10*3/uL (ref 4.0–10.5)
nRBC: 0 % (ref 0.0–0.2)

## 2019-06-29 LAB — HEPATIC FUNCTION PANEL
ALT: 11 U/L (ref 0–44)
AST: 29 U/L (ref 15–41)
Albumin: 3.5 g/dL (ref 3.5–5.0)
Alkaline Phosphatase: 60 U/L (ref 38–126)
Bilirubin, Direct: 0.3 mg/dL — ABNORMAL HIGH (ref 0.0–0.2)
Indirect Bilirubin: 0.8 mg/dL (ref 0.3–0.9)
Total Bilirubin: 1.1 mg/dL (ref 0.3–1.2)
Total Protein: 6.6 g/dL (ref 6.5–8.1)

## 2019-06-29 LAB — PROCALCITONIN: Procalcitonin: 16.77 ng/mL

## 2019-06-29 LAB — LACTIC ACID, PLASMA
Lactic Acid, Venous: 2.6 mmol/L (ref 0.5–1.9)
Lactic Acid, Venous: 1.5 mmol/L (ref 0.5–1.9)

## 2019-06-29 LAB — APTT: aPTT: 34 seconds (ref 24–36)

## 2019-06-29 LAB — TROPONIN I (HIGH SENSITIVITY)
Troponin I (High Sensitivity): 86 ng/L — ABNORMAL HIGH (ref <18)
Troponin I (High Sensitivity): 347 ng/L (ref <18)

## 2019-06-29 MED ORDER — ACETAMINOPHEN 325 MG PO TABS: 650.0000 mg | ORAL_TABLET | Freq: Four times a day (QID) | ORAL | Status: DC | PRN

## 2019-06-29 MED ORDER — METOPROLOL TARTRATE 25 MG PO TABS
25.0000 mg | ORAL_TABLET | Freq: Two times a day (BID) | ORAL | Status: DC
  Administered 2019-06-30 (×2): 25 mg via ORAL
  Filled 2019-06-29 (×3): qty 1

## 2019-06-29 MED ORDER — DILTIAZEM HCL ER COATED BEADS 240 MG PO CP24
240.0000 mg | ORAL_CAPSULE | Freq: Every day | ORAL | Status: DC
  Administered 2019-06-30: 240 mg via ORAL
  Filled 2019-06-29: qty 2
  Filled 2019-06-29 (×2): qty 1
  Filled 2019-06-29: qty 2

## 2019-06-29 MED ORDER — ACETAMINOPHEN 650 MG RE SUPP: 650.0000 mg | Freq: Four times a day (QID) | RECTAL | Status: DC | PRN

## 2019-06-29 MED ORDER — MAGNESIUM HYDROXIDE 400 MG/5ML PO SUSP: 30.0000 mL | Freq: Every day | ORAL | Status: DC | PRN

## 2019-06-29 MED ORDER — TRAZODONE HCL 50 MG PO TABS: 25.0000 mg | ORAL_TABLET | Freq: Every evening | ORAL | Status: DC | PRN

## 2019-06-29 MED ORDER — ALBUTEROL SULFATE (2.5 MG/3ML) 0.083% IN NEBU: 2.5000 mg | INHALATION_SOLUTION | RESPIRATORY_TRACT | Status: DC | PRN

## 2019-06-29 MED ORDER — SODIUM CHLORIDE 0.9 % IV SOLN: 1.0000 g | INTRAVENOUS | Status: DC

## 2019-06-29 MED ORDER — ONDANSETRON HCL 4 MG PO TABS: 4.0000 mg | ORAL_TABLET | Freq: Four times a day (QID) | ORAL | Status: DC | PRN

## 2019-06-29 MED ORDER — SODIUM CHLORIDE 0.9 % IV BOLUS
1000.0000 mL | Freq: Once | INTRAVENOUS | Status: AC
  Administered 2019-06-29: 1000 mL via INTRAVENOUS

## 2019-06-29 MED ORDER — ONDANSETRON HCL 4 MG/2ML IJ SOLN: 4.0000 mg | Freq: Four times a day (QID) | INTRAMUSCULAR | Status: DC | PRN

## 2019-06-29 MED ORDER — ASPIRIN EC 81 MG PO TBEC
81.0000 mg | DELAYED_RELEASE_TABLET | Freq: Every day | ORAL | Status: DC
  Administered 2019-06-30 – 2019-07-01 (×2): 81 mg via ORAL
  Filled 2019-06-29 (×2): qty 1

## 2019-06-29 MED ORDER — SODIUM CHLORIDE 0.9 % IV SOLN
1.0000 g | INTRAVENOUS | Status: DC
  Administered 2019-06-29 – 2019-06-30 (×2): 1 g via INTRAVENOUS
  Filled 2019-06-29 (×2): qty 10
  Filled 2019-06-29: qty 1

## 2019-06-29 MED ORDER — ENOXAPARIN SODIUM 40 MG/0.4ML ~~LOC~~ SOLN
40.0000 mg | SUBCUTANEOUS | Status: DC
  Administered 2019-06-29 – 2019-06-30 (×2): 40 mg via SUBCUTANEOUS
  Filled 2019-06-29 (×2): qty 0.4

## 2019-06-29 MED ORDER — ROSUVASTATIN CALCIUM 20 MG PO TABS
20.0000 mg | ORAL_TABLET | Freq: Every day | ORAL | Status: DC
  Administered 2019-06-30 – 2019-07-01 (×2): 20 mg via ORAL
  Filled 2019-06-29 (×2): qty 1
  Filled 2019-06-29: qty 2
  Filled 2019-06-29: qty 1
  Filled 2019-06-29: qty 2

## 2019-06-29 MED ORDER — SODIUM CHLORIDE 0.9 % IV SOLN
INTRAVENOUS | Status: DC
  Administered 2019-06-29 – 2019-06-30 (×2): via INTRAVENOUS

## 2019-06-29 NOTE — H&P (Signed)
[facility] [place] NAME: Timothy Eaton    MR#:  [id number]  DATE OF BIRTH:  Mar 28, 1941  DATE OF ADMISSION:  06/29/2019  PRIMARY CARE PHYSICIAN: [name], MD   REQUESTING/REFERRING PHYSICIAN: Arta Silence, MD CHIEF COMPLAINT:   Chief Complaint  Patient presents with   Fever    HISTORY OF PRESENT ILLNESS:  Timothy Eaton  is a 78 y.o. pleasant Caucasian male with a known history of multiple neuro problems including hypertension, dyslipidemia, GERD, COPD, peripheral vascular disease and atrial fibrillation, presented to the emergency room with onset of fever since yesterday as well as dysuria with diminished urine output as well as nocturia last night.  He denied any abdominal pain or flank pain.  He had nausea and vomiting yesterday without diarrhea.  He was seen in [facility] and was given a shot of antibiotic and a prescription for Omnicef and Zofran.  This afternoon his wife was waking him up and he was confused and incoherent and was vomiting.  He has been feeling generally weak.  He has not had any appetite today.  No cough or wheezing or dyspnea or palpitations or chest pain.  No recent sick exposures to COVID-19.  Upon presentation to the emergency room, temperature was 100.1 and heart rate was 91 respiratory rate 18 and later 27 and pulse oximetry 96% on 2 L of O2 by nasal cannula and later 94%.  Later on pulse oximetry was down to 89% room air.  Labs revealed potassium of 3.6 BUN 26 with creatinine 1.36.  Lactic acid was 2.6 and high-sensitivity troponin I was 86.  Urinalysis was strongly positive for UTI.  CBC was unremarkable.  COVID-19 test is currently pending.  Portable chest x-ray showed emphysematous changes with no acute cardiopulmonary disease.  Blood cultures were sent.  EKG showed normal sinus rhythm with a rate of 90.  The patient was given 1 L bolus of IV normal saline and 1 g of IV Rocephin.  He will be admitted to medical  monitored bed for further evaluation and management.  PAST MEDICAL HISTORY:   Past Medical History:  Diagnosis Date   AAA (abdominal aortic aneurysm) ([place])    [place], Repaired, 2005   Atrial fibrillation ([place])    Postoperative in past   Carotid artery disease ([place])    Minimal, doppler 2004, 0-39% bilateral, doppler 05/17/09 0-39% bilateral   COPD (chronic obstructive pulmonary disease) ([place])    Coronary artery disease    Moderate, catheterization 2005, nuclear scan 9/09 no ischemia, EF 60%, echo   ED (erectile dysfunction)    Ejection fraction    EF 60% echo, September, 2009   GERD (gastroesophageal reflux disease)    Hemorrhoids    Hyperlipidemia    Hypertension    Lung nodule    Followup by pulmonary in past   Palpitations    History palpitations in the past   Palpitations    January, 2013   Shortness of breath     PAST SURGICAL HISTORY:   Past Surgical History:  Procedure Laterality Date   ABDOMINAL AORTIC ANEURYSM REPAIR  04/06/04   Dr. Rodney Langton SURGERY     CHOLECYSTECTOMY  11/26/02   PILONIDAL CYST EXCISION  11/11    SOCIAL HISTORY:   Social History   Tobacco Use   Smoking status: Former Smoker    Quit date: 08/06/2002    Years since quitting: 16.9   Smokeless tobacco: Current User  Types: Chew  Substance Use Topics   Alcohol use: No    FAMILY HISTORY:   Family History  Problem Relation Age of Onset   Hypertension Mother        ?   Stroke Mother        CVA   Diabetes Mother        DM   Heart attack Father        MI   Cancer Sister        Gall bladder    Stroke Other        All mom's family died of strokes    DRUG ALLERGIES:   Allergies  Allergen Reactions   Sulfamethoxazole-Trimethoprim     REACTION: rash    REVIEW OF SYSTEMS:   ROS As per history of present illness. All pertinent systems were reviewed above. Constitutional,  HEENT, cardiovascular, respiratory, GI, GU, musculoskeletal, neuro,  psychiatric, endocrine,  integumentary and hematologic systems were reviewed and are otherwise  negative/unremarkable except for positive findings mentioned above in the HPI.   MEDICATIONS AT HOME:   Prior to Admission medications   Medication Sig Start Date End Date Taking? Authorizing Provider  aspirin 81 MG EC tablet Take 81 mg by mouth daily.     Yes [provider]  cefdinir (OMNICEF) 300 MG capsule Take 300 mg by mouth 2 (two) times daily. For 14 days 06/29/19 07/13/19 Yes [provider]  diltiazem (CARDIZEM CD) 240 MG 24 hr capsule TAKE 1 CAPSULE BY MOUTH EVERY DAY Patient taking differently: Take 240 mg by mouth daily.  04/14/19  Yes Nahser, Wonda Cheng, MD  metoprolol tartrate (LOPRESSOR) 25 MG tablet TAKE 1 TABLET BY MOUTH TWICE DAILY. PLEASE KEEP APPOINTMENT IN JULY WITH DOCTOR NAHSER BFOR FUTURE REFILLS Patient taking differently: Take 25 mg by mouth 2 (two) times daily.  04/14/19  Yes Nahser, Wonda Cheng, MD  ondansetron (ZOFRAN-ODT) 4 MG disintegrating tablet Take 4 mg by mouth every 8 (eight) hours as needed for nausea. 06/29/19  Yes [provider]  rosuvastatin (CRESTOR) 20 MG tablet TAKE 1 TABLET BY MOUTH ONCE DAILY Patient taking differently: Take 20 mg by mouth daily.  04/29/19  Yes Nahser, Wonda Cheng, MD      VITAL SIGNS:  Blood pressure 111/72, pulse 94, temperature 100.1 F (37.8 C), temperature source Oral, resp. rate (!) 27, height 5\' 10"  (1.778 m), weight 74.8 kg, SpO2 94 %.  PHYSICAL EXAMINATION:  Physical Exam  GENERAL:  78 y.o.-year-old pleasant Caucasian male patient lying in the bed with no acute distress.  EYES: Pupils equal, round, reactive to light and accommodation. No scleral icterus. Extraocular muscles intact.  HEENT: Head atraumatic, normocephalic. Oropharynx with slightly dry lips membrane tongue and nasopharynx clear.  NECK:  Supple, no jugular venous distention. No thyroid enlargement, no tenderness.  LUNGS: Normal breath sounds  bilaterally, no wheezing, rales,rhonchi or crepitation. No use of accessory muscles of respiration.  CARDIOVASCULAR: Regular rate and rhythm, S1, S2 normal. No murmurs, rubs, or gallops.  ABDOMEN: Soft, nondistended, nontender. Bowel sounds present. No organomegaly or mass.  EXTREMITIES: No pedal edema, cyanosis, or clubbing.  NEUROLOGIC: Cranial nerves II through XII are intact. Muscle strength 5/5 in all extremities. Sensation intact. Gait not checked.  PSYCHIATRIC: The patient is alert and oriented x 3.  Normal affect and good eye contact. SKIN: No obvious rash, lesion, or ulcer.   LABORATORY PANEL:   CBC Recent Labs  Lab 06/29/19 1758  WBC 5.1  HGB 14.2  HCT 41.3  PLT 156   ------------------------------------------------------------------------------------------------------------------  Chemistries  Recent Labs  Lab 06/29/19 1758  NA 137  K 3.6  CL 104  CO2 20*  GLUCOSE 146*  BUN 26*  CREATININE 1.34*  CALCIUM 8.9  AST 29  ALT 11  ALKPHOS 60  BILITOT 1.1   ------------------------------------------------------------------------------------------------------------------  Cardiac Enzymes No results for input(s): TROPONINI in the last 168 hours. ------------------------------------------------------------------------------------------------------------------  RADIOLOGY:  Dg Chest Portable 1 View  Result Date: 06/29/2019 CLINICAL DATA:  Fever and weakness. EXAM: PORTABLE CHEST 1 VIEW COMPARISON:  September 15, 2012 FINDINGS: There are severe emphysematous changes bilaterally. There are areas of architectural distortion and scarring involving the bilateral lower lobes and left upper lobe. There is no obvious pneumothorax. No focal infiltrate. The heart size is normal. Aortic calcifications are noted. There is no acute osseous abnormality. IMPRESSION: 1. Severe emphysematous changes bilaterally. 2. No obvious pneumothorax or focal infiltrate. Electronically Signed   By:  Constance Holster M.D.   On: 06/29/2019 19:08      IMPRESSION AND PLAN:   1.  Sepsis secondary to UTI, without severe sepsis or septic shock.  This is manifested by tachypnea with respiratory to 27 and tachycardia of 94 with associated lactic acid of 2.6 and altered mental status.  The patient will be admitted to medical monitored bed.  We will continue hydration with IV normal saline as well as IV antibiotic therapy with IV Rocephin.  2.  Metabolic encephalopathy.  This is clearly secondary to his sepsis.  This is gradually improving with hydration IV antibiotic therapy.  We will continue monitoring his mental status.  3.  Hypertension.  We will continue p.o. Lopressor with holding parameters and hold off Cardizem CD due to the patient's borderline heart rate and occasional bradycardia in the ER.  We will add as needed IV hydralazine.  4.  Dyslipidemia.  Statin therapy will be resumed.  5.  Mild COPD.  No current exacerbation.  As needed albuterol inhaler will be provided.  6.  DVT prophylaxis.  Subcutaneous Lovenox.   All the records are reviewed and case discussed with ED provider. The plan of care was discussed in details with the patient (and his daughter). I answered all questions. The patient and his daughter agreed to proceed with the above mentioned plan. Further management will depend upon hospital course.   CODE STATUS: Full code  TOTAL TIME TAKING CARE OF THIS PATIENT: 55 minutes.    Christel Mormon M.D on 06/29/2019 at 8:42 PM  [facility]   From 7 PM-7 AM, contact night-coverage [email or web address]  CC: Primary care physician; [name], MD   Note: This dictation was prepared with Dragon dictation along with smaller phrase technology. Any transcriptional errors that result from this process are unintentional.

## 2019-06-29 NOTE — ED Notes (Signed)
Urine sent to lab 

## 2019-06-29 NOTE — Telephone Encounter (Signed)
Please check on him tomorrow I haven't seen him in a couple of years--so probably good if we set up a routine visit sometime next year It sounds like an urgent care visit is better than just COVID testing

## 2019-06-29 NOTE — ED Provider Notes (Signed)
[facility] Emergency Department Provider Note ____________________________________________   First MD Initiated Contact with Patient 06/29/19 1755     (approximate)  I have reviewed the triage vital signs and the nursing notes.   HISTORY  Chief Complaint Fever    HPI Timothy Eaton is a 78 y.o. male with PMH as noted below, diagnosed with a UTI earlier today, and who presents with vomiting, weakness, and an episode of unresponsiveness.  He has had dysuria and suprapubic pain with urination over the last several days.  The patient and his daughter state that he was diagnosed with a UTI at the outpatient clinic this morning, and given an antibiotic shot.  He states that after going home he continued to feel unwell and had nausea and vomiting.  The daughter states that she found the patient passed out and minimally responsive in his room, although he soon awoke.  Past Medical History:  Diagnosis Date  . AAA (abdominal aortic aneurysm) ([place])    [name], Repaired, 2005  . Atrial fibrillation (HCC)    Postoperative in past  . Carotid artery disease ([place])    Minimal, doppler 2004, 0-39% bilateral, doppler 05/17/09 0-39% bilateral  . COPD (chronic obstructive pulmonary disease) ([place])   . Coronary artery disease    Moderate, catheterization 2005, nuclear scan 9/09 no ischemia, EF 60%, echo  . ED (erectile dysfunction)   . Ejection fraction    EF 60% echo, September, 2009  . GERD (gastroesophageal reflux disease)   . Hemorrhoids   . Hyperlipidemia   . Hypertension   . Lung nodule    Followup by pulmonary in past  . Palpitations    History palpitations in the past  . Palpitations    January, 2013  . Shortness of breath     Patient Active Problem List   Diagnosis Date Noted  . Sepsis ([place]) 06/29/2019  . Preventative health care 04/09/2017  . Advance directive discussed with patient 04/09/2017  . COPD exacerbation ([place]) 07/29/2015  . Coronary artery  disease   . Carotid artery disease ([place])   . AAA (abdominal aortic aneurysm) ([place])   . Atrial fibrillation ([place])   . Pulmonary nodules 04/02/2011  . COPD (chronic obstructive pulmonary disease) with emphysema ([place]) 04/02/2009  . HEMORRHOIDS, HX OF 04/02/2009  . Hyperlipidemia 06/25/2008  . Essential hypertension 06/25/2008    Past Surgical History:  Procedure Laterality Date  . ABDOMINAL AORTIC ANEURYSM REPAIR  04/06/04   Dr. [name]  . BACK SURGERY    . CHOLECYSTECTOMY  11/26/02  . PILONIDAL CYST EXCISION  11/11    Prior to Admission medications   Medication Sig Start Date End Date Taking? Authorizing Provider  aspirin 81 MG EC tablet Take 81 mg by mouth daily.     Yes [provider]  cefdinir (OMNICEF) 300 MG capsule Take 300 mg by mouth 2 (two) times daily. For 14 days 06/29/19 07/13/19 Yes [provider]  diltiazem (CARDIZEM CD) 240 MG 24 hr capsule TAKE 1 CAPSULE BY MOUTH EVERY DAY Patient taking differently: Take 240 mg by mouth daily.  04/14/19  Yes Nahser, Wonda Cheng, MD  metoprolol tartrate (LOPRESSOR) 25 MG tablet TAKE 1 TABLET BY MOUTH TWICE DAILY. PLEASE KEEP APPOINTMENT IN JULY WITH DOCTOR NAHSER BFOR FUTURE REFILLS Patient taking differently: Take 25 mg by mouth 2 (two) times daily.  04/14/19  Yes Nahser, Wonda Cheng, MD  ondansetron (ZOFRAN-ODT) 4 MG disintegrating tablet Take 4 mg by mouth every 8 (eight) hours as  needed for nausea. 06/29/19  Yes [provider]  rosuvastatin (CRESTOR) 20 MG tablet TAKE 1 TABLET BY MOUTH ONCE DAILY Patient taking differently: Take 20 mg by mouth daily.  04/29/19  Yes Nahser, Wonda Cheng, MD    Allergies Sulfamethoxazole-trimethoprim  Family History  Problem Relation Age of Onset  . Hypertension Mother        ?  . Stroke Mother        CVA  . Diabetes Mother        DM  . Heart attack Father        MI  . Cancer Sister        Gall bladder   . Stroke Other        All mom's family died of strokes    Social  History Social History   Tobacco Use  . Smoking status: Former Smoker    Quit date: 08/06/2002    Years since quitting: 16.9  . Smokeless tobacco: Current User    Types: Chew  Substance Use Topics  . Alcohol use: No  . Drug use: No    Review of Systems  Constitutional: No fever. Eyes: No redness. ENT: No sore throat. Cardiovascular: Denies chest pain. Respiratory: Denies shortness of breath. Gastrointestinal: Positive for vomiting. Genitourinary: Positive for dysuria.  Musculoskeletal: Negative for back pain. Skin: Negative for rash. Neurological: Negative for headache.   ____________________________________________   PHYSICAL EXAM:  VITAL SIGNS: ED Triage Vitals  Enc Vitals Group     BP 06/29/19 1800 106/70     Pulse Rate 06/29/19 1748 91     Resp 06/29/19 1748 18     Temp 06/29/19 1748 100.1 F (37.8 C)     Temp Source 06/29/19 1748 Oral     SpO2 06/29/19 1748 96 %     Weight 06/29/19 1749 165 lb (74.8 kg)     Height 06/29/19 1749 5\' 10"  (1.778 m)     Head Circumference --      Peak Flow --      Pain Score 06/29/19 1748 7     Pain Loc --      Pain Edu? --      Excl. in [place]? --     Constitutional: Alert and oriented.  Somewhat weak appearing but in no acute distress. Eyes: Conjunctivae are normal.  Head: Atraumatic. Nose: No congestion/rhinnorhea. Mouth/Throat: Mucous membranes are dry.   Neck: Normal range of motion.  Cardiovascular: Normal rate, regular rhythm. Grossly normal heart sounds.  Good peripheral circulation. Respiratory: Normal respiratory effort.  No retractions. Lungs CTAB. Gastrointestinal: Soft and nontender. No distention.  Genitourinary: No flank tenderness. Musculoskeletal: Extremities warm and well perfused.  Neurologic:  Normal speech and language. No gross focal neurologic deficits are appreciated.  Skin:  Skin is warm and dry. No rash noted. Psychiatric: Mood and affect are normal. Speech and behavior are normal.   ____________________________________________   LABS (all labs ordered are listed, but only abnormal results are displayed)  Labs Reviewed  CBC WITH DIFFERENTIAL/PLATELET - Abnormal; Notable for the following components:      Result Value   Lymphs Abs 0.2 (*)    Abs Immature Granulocytes 0.26 (*)    All other components within normal limits  BASIC METABOLIC PANEL - Abnormal; Notable for the following components:   CO2 20 (*)    Glucose, Bld 146 (*)    BUN 26 (*)    Creatinine, Ser 1.34 (*)    GFR calc non Af Amer 50 (*)  GFR calc Af Amer 58 (*)    All other components within normal limits  HEPATIC FUNCTION PANEL - Abnormal; Notable for the following components:   Bilirubin, Direct 0.3 (*)    All other components within normal limits  LACTIC ACID, PLASMA - Abnormal; Notable for the following components:   Lactic Acid, Venous 2.6 (*)    All other components within normal limits  URINALYSIS, COMPLETE (UACMP) WITH MICROSCOPIC - Abnormal; Notable for the following components:   Color, Urine AMBER (*)    APPearance CLOUDY (*)    Hgb urine dipstick MODERATE (*)    Ketones, ur 20 (*)    Protein, ur 100 (*)    Nitrite POSITIVE (*)    Leukocytes,Ua MODERATE (*)    WBC, UA >50 (*)    All other components within normal limits  TROPONIN I (HIGH SENSITIVITY) - Abnormal; Notable for the following components:   Troponin I (High Sensitivity) 86 (*)    All other components within normal limits  TROPONIN I (HIGH SENSITIVITY) - Abnormal; Notable for the following components:   Troponin I (High Sensitivity) 347 (*)    All other components within normal limits  SARS CORONAVIRUS 2 (TAT 6-24 HRS)  CULTURE, BLOOD (ROUTINE X 2)  CULTURE, BLOOD (ROUTINE X 2)  LIPASE, BLOOD  LACTIC ACID, PLASMA  PROTIME-INR  APTT  BASIC METABOLIC PANEL  BASIC METABOLIC PANEL  CBC  PROCALCITONIN  LACTIC ACID, PLASMA   ____________________________________________  EKG  ED ECG REPORT I, Arta Silence, the attending physician, personally viewed and interpreted this ECG.  Date: 06/29/2019 EKG Time: 1750 Rate: 90 Rhythm: normal sinus rhythm QRS Axis: normal Intervals: normal ST/T Wave abnormalities: normal Narrative Interpretation: no evidence of acute ischemia  ____________________________________________  RADIOLOGY  CXR: Emphysematous changes with no focal infiltrate or other acute findings ____________________________________________   PROCEDURES  Procedure(s) performed: No  Procedures  Critical Care performed: Yes  CRITICAL CARE Performed by: Arta Silence   Total critical care time: 30 minutes  Critical care time was exclusive of separately billable procedures and treating other patients.  Critical care was necessary to treat or prevent imminent or life-threatening deterioration.  Critical care was time spent personally by me on the following activities: development of treatment plan with patient and/or surrogate as well as nursing, discussions with consultants, evaluation of patient's response to treatment, examination of patient, obtaining history from patient or surrogate, ordering and performing treatments and interventions, ordering and review of laboratory studies, ordering and review of radiographic studies, pulse oximetry and re-evaluation of patient's condition. ____________________________________________   INITIAL IMPRESSION / ASSESSMENT AND PLAN / ED COURSE  Pertinent labs & imaging results that were available during my care of the patient were reviewed by me and considered in my medical decision making (see chart for details).  78 year old male with PMH as noted above presents with vomiting, weakness, and apparent syncopal episode today after being diagnosed with a UTI earlier.  The patient has had dysuria for several days.  He was given an IM dose of an antibiotic earlier today and then sent home with oral antibiotics.  I reviewed the  past medical records in [place].  The note from the clinic visit today confirms that the patient was diagnosed with a UTI and given a dose of ceftriaxone.  The patient has had no ED visits or admissions within the last few years.  On exam the patient is slightly weak but overall relatively comfortable appearing.  He has a low-grade temperature with  otherwise normal vital signs, although his O2 saturation is in the low 90s.  The abdomen is soft and nontender.  Neurologic exam is nonfocal.  The patient is alert and oriented.  The remainder of the exam is as described above.  Overall presentation is most consistent with UTI, and I suspect that his other symptoms are related to this.  Differential includes COVID-19, bacterial pneumonia, or possible other source of infection.  I have a low suspicion for intra-abdominal etiology.  We will obtain a chest x-ray, sepsis work-up, and reassess.  ----------------------------------------- 10:24 PM on 06/29/2019 -----------------------------------------  Lab work-up is consistent with a UTI.  The patient does have an elevated lactic acid and a slightly elevated troponin consistent with demand ischemia.  He has received IV ceftriaxone.  I discussed the case with hospitalist for admission.  _______________________  [name] was evaluated in Emergency Department on 06/29/2019 for the symptoms described in the history of present illness. He was evaluated in the context of the global COVID-19 pandemic, which necessitated consideration that the patient might be at risk for infection with the SARS-CoV-2 virus that causes COVID-19. Institutional protocols and algorithms that pertain to the evaluation of patients at risk for COVID-19 are in a state of rapid change based on information released by regulatory bodies including the CDC and federal and state organizations. These policies and algorithms were followed during the patient's care in the ED.   ____________________________________________   FINAL CLINICAL IMPRESSION(S) / ED DIAGNOSES  Final diagnoses:  Sepsis, due to unspecified organism, unspecified whether acute organ dysfunction present [facility])      NEW MEDICATIONS STARTED DURING THIS VISIT:  Current Discharge Medication List       Note:  This document was prepared using Dragon voice recognition software and may include unintentional dictation errors.    Arta Silence, MD 06/29/19 2225

## 2019-06-29 NOTE — ED Notes (Signed)
Report given to Gracie, RN 

## 2019-06-29 NOTE — ED Notes (Signed)
Pt given apple juice and blanket

## 2019-06-29 NOTE — Progress Notes (Signed)
CODE SEPSIS - PHARMACY COMMUNICATION  **Broad Spectrum Antibiotics should be administered within 1 hour of Sepsis diagnosis**  Time Code Sepsis Called/Page Received:   11/23 @ 2041   Antibiotics Ordered: Ceftriaxone   Time of 1st antibiotic administration: Ceftriaxone 1 gm IV X 1 @ 2145   Additional action taken by pharmacy:   If necessary, Name of Provider/Nurse Contacted:     Amond Speranza D ,PharmD Clinical Pharmacist  06/29/2019  10:57 PM

## 2019-06-29 NOTE — ED Notes (Signed)
1 set of blood cultures, blue, grey, light green and lavender tubes sent to lab

## 2019-06-29 NOTE — ED Triage Notes (Signed)
Pt arrives [facility] from home for nausea, vomiting, fever and syncopal episode, does not remember passing out. Pt seen at [facility] today and dx with UTI and was given a shot but unsure of what kind of shot. Pt reports symptoms started yesterday morning. A&Ox4 and in NAD. Reports SHOB on exertion but states this is normal.

## 2019-06-29 NOTE — Telephone Encounter (Signed)
Marlowe Kays (spouse) called stated pt spiked fever last night 101   And this morning He complained this morning peed it hurt this morning temp 101.5   I spoke Ananstasiya she recommended pt go to urgent care because of hurting when going to bathroom.  She will see if pt will go to urgent care if won't go she will go have him tested for covid

## 2019-06-29 NOTE — ED Notes (Signed)
Pt given remote.  

## 2019-06-30 ENCOUNTER — Other Ambulatory Visit: Payer: Self-pay

## 2019-06-30 DIAGNOSIS — R778 Other specified abnormalities of plasma proteins: Secondary | ICD-10-CM

## 2019-06-30 DIAGNOSIS — I2510 Atherosclerotic heart disease of native coronary artery without angina pectoris: Secondary | ICD-10-CM

## 2019-06-30 DIAGNOSIS — N390 Urinary tract infection, site not specified: Secondary | ICD-10-CM | POA: Diagnosis present

## 2019-06-30 DIAGNOSIS — N3000 Acute cystitis without hematuria: Secondary | ICD-10-CM

## 2019-06-30 DIAGNOSIS — E785 Hyperlipidemia, unspecified: Secondary | ICD-10-CM

## 2019-06-30 DIAGNOSIS — I480 Paroxysmal atrial fibrillation: Secondary | ICD-10-CM

## 2019-06-30 DIAGNOSIS — R7989 Other specified abnormal findings of blood chemistry: Secondary | ICD-10-CM | POA: Diagnosis present

## 2019-06-30 LAB — CBC
HCT: 42.1 % (ref 39.0–52.0)
Hemoglobin: 13.6 g/dL (ref 13.0–17.0)
MCH: 29.9 pg (ref 26.0–34.0)
MCHC: 32.3 g/dL (ref 30.0–36.0)
MCV: 92.5 fL (ref 80.0–100.0)
Platelets: 153 10*3/uL (ref 150–400)
RBC: 4.55 MIL/uL (ref 4.22–5.81)
RDW: 12.8 % (ref 11.5–15.5)
WBC: 8.2 10*3/uL (ref 4.0–10.5)
nRBC: 0 % (ref 0.0–0.2)

## 2019-06-30 LAB — BASIC METABOLIC PANEL
Anion gap: 9 (ref 5–15)
BUN: 24 mg/dL — ABNORMAL HIGH (ref 8–23)
CO2: 25 mmol/L (ref 22–32)
Calcium: 8.6 mg/dL — ABNORMAL LOW (ref 8.9–10.3)
Chloride: 107 mmol/L (ref 98–111)
Creatinine, Ser: 1.22 mg/dL (ref 0.61–1.24)
GFR calc Af Amer: >60 mL/min (ref >60)
GFR calc non Af Amer: 56 mL/min — ABNORMAL LOW (ref >60)
Glucose, Bld: 108 mg/dL — ABNORMAL HIGH (ref 70–99)
Potassium: 4 mmol/L (ref 3.5–5.1)
Sodium: 141 mmol/L (ref 135–145)

## 2019-06-30 LAB — LACTIC ACID, PLASMA
Lactic Acid, Venous: 2.1 mmol/L (ref 0.5–1.9)
Lactic Acid, Venous: 1.3 mmol/L (ref 0.5–1.9)
Lactic Acid, Venous: 1 mmol/L (ref 0.5–1.9)

## 2019-06-30 LAB — TROPONIN I (HIGH SENSITIVITY)
Troponin I (High Sensitivity): 203 ng/L (ref <18)
Troponin I (High Sensitivity): 166 ng/L (ref <18)
Troponin I (High Sensitivity): 152 ng/L (ref <18)

## 2019-06-30 LAB — SARS CORONAVIRUS 2 (TAT 6-24 HRS): SARS Coronavirus 2: NEGATIVE

## 2019-06-30 MED ORDER — SODIUM CHLORIDE 0.9 % IV BOLUS
500.0000 mL | Freq: Once | INTRAVENOUS | Status: AC
  Administered 2019-06-30: 500 mL via INTRAVENOUS

## 2019-06-30 MED ORDER — ALUM & MAG HYDROXIDE-SIMETH 200-200-20 MG/5ML PO SUSP
30.0000 mL | ORAL | Status: DC | PRN
  Administered 2019-06-30: 30 mL via ORAL
  Filled 2019-06-30: qty 30

## 2019-06-30 MED ORDER — SODIUM CHLORIDE 0.9 % IV SOLN
INTRAVENOUS | Status: DC
  Administered 2019-06-30 – 2019-07-01 (×3): via INTRAVENOUS

## 2019-06-30 MED ORDER — ENSURE ENLIVE PO LIQD
237.0000 mL | Freq: Two times a day (BID) | ORAL | Status: DC
  Administered 2019-07-01: 237 mL via ORAL

## 2019-06-30 NOTE — Progress Notes (Signed)
OT Cancellation Note  Patient Details Name: Timothy Eaton MRN: [id number] DOB: 1940-08-09   Cancelled Treatment:    Reason Eval/Treat Not Completed: OT screened, no needs identified, will sign off. Consult received, chart reviewed. Pt screened, no skilled OT needs identified. Pt near baseline independence with ADL and functional mobility. Will sign off at this time. Thank you for the consult.   Jeni Salles, MPH, MS, OTR/L ascom [phone] 06/30/19, 4:14 PM

## 2019-06-30 NOTE — Progress Notes (Signed)
PROGRESS NOTE    Timothy Eaton  [id number] DOB: 01-Jul-1941 DOA: 06/29/2019 PCP: [name], MD    Brief Narrative:   Timothy Eaton  is a 78 y.o. pleasant Caucasian male with a known history of multiple neuro problems including hypertension, dyslipidemia, GERD, COPD, peripheral vascular disease and atrial fibrillation, presented to the emergency room with onset of fever since yesterday as well as dysuria with diminished urine output as well as nocturia last night.  He denied any abdominal pain or flank pain.  He had nausea and vomiting yesterday without diarrhea.  He was seen in [place] clinic and was given a shot of antibiotic and a prescription for Omnicef and Zofran.  This afternoon his wife was waking him up and he was confused and incoherent and was vomiting.  He has been feeling generally weak.  He has not had any appetite today.  No cough or wheezing or dyspnea or palpitations or chest pain.  No recent sick exposures to COVID-19.  Upon presentation to the emergency room, temperature was 100.1 and heart rate was 91 respiratory rate 18 and later 27 and pulse oximetry 96% on 2 L of O2 by nasal cannula and later 94%.  Later on pulse oximetry was down to 89% room air.  Labs revealed potassium of 3.6 BUN 26 with creatinine 1.36.  Lactic acid was 2.6 and high-sensitivity troponin I was 86.  Urinalysis was strongly positive for UTI.  CBC was unremarkable.  COVID-19 test is currently pending.  Portable chest x-ray showed emphysematous changes with no acute cardiopulmonary disease.  Blood cultures were sent.  EKG showed normal sinus rhythm with a rate of 90.  The patient was given 1 L bolus of IV normal saline and 1 g of IV Rocephin.  He will be admitted to medical monitored bed for further evaluation and management.  Interim History: -11/24: Mental status improved, he is oriented to place and person, little confused about the time.  Subjective:  Pt reports dysuria, burning on  urination, no nausea, vomiting, diarrhea or abdominal pain.  He has mild shortness of breath, but no chest pain, no cough.  Denies fever or chills.  Mental status improved, he is orientated to place and person, but still little confused about time.  Assessment & Plan:   Principal Problem:   UTI (urinary tract infection) Active Problems:   Hyperlipidemia   Essential hypertension   COPD (chronic obstructive pulmonary disease) with emphysema (HCC)   Coronary artery disease   Atrial fibrillation (HCC)   Sepsis (HCC)   Elevated troponin  Sepsis secondary to UTI, without septic shock.  This is manifested by tachypnea with respiratory to 27 and tachycardia of 94 with associated lactic acid of 2.6 and altered mental status on admission. Lactic acid is still elevated at 2.1. mental status improved, but not come back to baseline yet.  Procalcitonin 16.77.  -continue IV Rocephin -f/u blood culture -will add on Urine culture which was not done on admission. -will trend lactic acid levels per sepsis protocol. -IVF: 1L of NS bolus in ED, will give 500 cc of NS, then followed by 100 cc/h  -PT/OT  Metabolic encephalopathy.  This is mostly likely secondary to his sepsis and UTI.  This has been gradually improving with hydration IV antibiotic therapy.   -continue monitoring his mental status.   Hypertension.   --cardizme and metoprolol  Dyslipidemia.   -Crestor  Mild COPD.  No current exacerbation.  - As needed albuterol inhaler  Hx of Coronary artery disease and Elevated troponin: Trop 347.  Patient denies chest pain.  Possibly due to demand ischemia secondary to sepsis -Trend troponin -Aspirin and Crestor  Hx of Atrial fibrillation ([place]): not on anticoagulants. -on metoprolol and Cardizem    DVT prophylaxis:  Sq Lovenox Code Status: full Family Communication: no, family not at bedside Disposition Plan: Mental status improved, but has not completely come back to baseline.  Patient  has elevated troponin.  Still has elevated lactic acid 2.1, still need IV antibiotics and IV fluid.  Likely discharge tomorrow. Barriers for discharge:    Consultants:   none  Procedures:  none  Antimicrobials: Anti-infectives (From admission, onward)   Start     Dose/Rate Route Frequency Ordered Stop   06/29/19 2045  cefTRIAXone (ROCEPHIN) 1 g in sodium chloride 0.9 % 100 mL IVPB  Status:  Discontinued     1 g 200 mL/hr over 30 Minutes Intravenous Every 24 hours 06/29/19 2040 06/29/19 2228   06/29/19 2015  cefTRIAXone (ROCEPHIN) 1 g in sodium chloride 0.9 % 100 mL IVPB     1 g 200 mL/hr over 30 Minutes Intravenous Every 24 hours 06/29/19 2009            Objective: Vitals:   06/30/19 0135 06/30/19 0454 06/30/19 1036 06/30/19 1153  BP: 92/67 103/67 119/72 112/72  Pulse: 65 67 71 67  Resp: 17 20    Temp: 97.6 F (36.4 C) 97.6 F (36.4 C)  97.6 F (36.4 C)  TempSrc: Oral Oral  Oral  SpO2: 97% 97%  98%  Weight:      Height:        Intake/Output Summary (Last 24 hours) at 06/30/2019 1500 Last data filed at 06/30/2019 1405 Gross per 24 hour  Intake 1192.44 ml  Output 300 ml  Net 892.44 ml   Filed Weights   06/29/19 1749  Weight: 74.8 kg    Examination:  Physical Exam:  General: Not in acute distress HEENT: PERRL, EOMI, no scleral icterus, No JVD or bruit Cardiac: S1/S2, RRR, No murmurs, gallops or rubs Pulm: No rales, wheezing, rhonchi or rubs. Abd: Soft, nondistended, nontender, no rebound pain, no organomegaly, BS present Ext: No edema. 2+DP/PT pulse bilaterally Musculoskeletal: No joint deformities, erythema, or stiffness, ROM full Skin: No rashes.  Neuro: Alert and oriented to place and person, little confused to time, cranial nerves II-XII grossly intact, moves all extremeties normally.  Psych: Patient is not psychotic, no suicidal or hemocidal ideation.    Data Reviewed: I have personally reviewed following labs and imaging studies  CBC:  Recent Labs  Lab 06/29/19 1758 06/30/19 0458  WBC 5.1 8.2  NEUTROABS 4.5  --   HGB 14.2 13.6  HCT 41.3 42.1  MCV 87.9 92.5  PLT 156 0000000   Basic Metabolic Panel: Recent Labs  Lab 06/29/19 1758 06/29/19 2234 06/30/19 0458  NA 137 138 141  K 3.6 3.8 4.0  CL 104 105 107  CO2 20* 24 25  GLUCOSE 146* 149* 108*  BUN 26* 25* 24*  CREATININE 1.34* 1.35* 1.22  CALCIUM 8.9 8.4* 8.6*   GFR: Estimated Creatinine Clearance: 50.7 mL/min (by C-G formula based on SCr of 1.22 mg/dL). Liver Function Tests: Recent Labs  Lab 06/29/19 1758  AST 29  ALT 11  ALKPHOS 60  BILITOT 1.1  PROT 6.6  ALBUMIN 3.5   Recent Labs  Lab 06/29/19 1758  LIPASE 28   No results for input(s): AMMONIA in the last 168  hours. Coagulation Profile: Recent Labs  Lab 06/29/19 2234  INR 1.2   Cardiac Enzymes: No results for input(s): CKTOTAL, CKMB, CKMBINDEX, TROPONINI in the last 168 hours. BNP (last 3 results) No results for input(s): PROBNP in the last 8760 hours. HbA1C: No results for input(s): HGBA1C in the last 72 hours. CBG: No results for input(s): GLUCAP in the last 168 hours. Lipid Profile: No results for input(s): CHOL, HDL, LDLCALC, TRIG, CHOLHDL, LDLDIRECT in the last 72 hours. Thyroid Function Tests: No results for input(s): TSH, T4TOTAL, FREET4, T3FREE, THYROIDAB in the last 72 hours. Anemia Panel: No results for input(s): VITAMINB12, FOLATE, FERRITIN, TIBC, IRON, RETICCTPCT in the last 72 hours. Sepsis Labs: Recent Labs  Lab 06/29/19 2234 06/30/19 0502 06/30/19 0902 06/30/19 1144  PROCALCITON 16.77  --   --   --   LATICACIDVEN 1.5 2.1* 1.3 1.0    Recent Results (from the past 240 hour(s))  SARS CORONAVIRUS 2 (TAT 6-24 HRS) Nasopharyngeal Nasopharyngeal Swab     Status: None   Collection Time: 06/29/19  7:14 PM   Specimen: Nasopharyngeal Swab  Result Value Ref Range Status   SARS Coronavirus 2 NEGATIVE NEGATIVE Final    Comment: (NOTE) SARS-CoV-2 target nucleic acids  are NOT DETECTED. The SARS-CoV-2 RNA is generally detectable in upper and lower respiratory specimens during the acute phase of infection. Negative results do not preclude SARS-CoV-2 infection, do not rule out co-infections with other pathogens, and should not be used as the sole basis for treatment or other patient management decisions. Negative results must be combined with clinical observations, patient history, and epidemiological information. The expected result is Negative. Fact Sheet for Patients: SugarRoll.be Fact Sheet for Healthcare Providers: https://www.woods-mathews.com/ This test is not yet approved or cleared by the [place] FDA and  has been authorized for detection and/or diagnosis of SARS-CoV-2 by FDA under an Emergency Use Authorization (EUA). This EUA will remain  in effect (meaning this test can be used) for the duration of the COVID-19 declaration under Section 56 4(b)(1) of the Act, 21 U.S.C. section 360bbb-3(b)(1), unless the authorization is terminated or revoked sooner. Performed at [facility], [place] [address], [place], [place] [address]      RN Pressure Injury Documentation:       Radiology Studies: Dg Chest Portable 1 View  Result Date: 06/29/2019 CLINICAL DATA:  Fever and weakness. EXAM: PORTABLE CHEST 1 VIEW COMPARISON:  September 15, 2012 FINDINGS: There are severe emphysematous changes bilaterally. There are areas of architectural distortion and scarring involving the bilateral lower lobes and left upper lobe. There is no obvious pneumothorax. No focal infiltrate. The heart size is normal. Aortic calcifications are noted. There is no acute osseous abnormality. IMPRESSION: 1. Severe emphysematous changes bilaterally. 2. No obvious pneumothorax or focal infiltrate. Electronically Signed   By: Constance Holster M.D.   On: 06/29/2019 19:08        Scheduled Meds: . aspirin EC  81 mg Oral Daily   . diltiazem  240 mg Oral Daily  . enoxaparin (LOVENOX) injection  40 mg Subcutaneous Q24H  . metoprolol tartrate  25 mg Oral BID  . rosuvastatin  20 mg Oral Daily   Continuous Infusions: . sodium chloride    . cefTRIAXone (ROCEPHIN)  IV Stopped (06/29/19 2215)     LOS: 1 day    Time spent: 30 min     Ivor Costa, DO [facility] PAGER is on [place]  If 7PM-7AM, please contact night-coverage [email or web address] Password [facility] 2 06/30/2019,  3:00 PM

## 2019-06-30 NOTE — Progress Notes (Signed)
Initial Nutrition Assessment  DOCUMENTATION CODES:   Not applicable  INTERVENTION:  -Ensure Enlive po BID, each supplement provides 350 kcal and 20 grams of protein  -MVI with minerals daily   NUTRITION DIAGNOSIS:   Increased nutrient needs related to acute illness, chronic illness(sepsis secondary to UTI; COPD with emphysema) as evidenced by estimated needs.    GOAL:   Patient will meet greater than or equal to 90% of their needs    MONITOR:   PO intake, Labs, I & O's, Supplement acceptance, Weight trends  REASON FOR ASSESSMENT:   Malnutrition Screening Tool    ASSESSMENT:  RD working remotely.  78 year old male with past medical history significant of HTN, HLD, GERD, COPD with emphysema, PVD, a-fib, h/o AAA who presented to ED with onset fever as well as dysuria, diminished urinary ouput and nocturia. Patient seen in [facility] 1 day prior to presentation for nausea and vomiting and given a prescription for Omnicef and Zofran. Wife of patient reports that she woke him form a nap after returning home from the clinic due to vomiting and patient was incoherent and confused.  Patient admitted with sepsis secondary to UTI.  Patient eating 100% x 1 recorded meal. Per chart review, patient reports decreased appetite/intake prior to admission related to onset of nausea and vomiting. RD will continue to monitor for po intake of meals and provide Ensure to aid with calorie/protien needs.   Weight history reviewed; noted 4.8 lb (2.8 %) wt loss over the past 4 months which is insignificant for time frame. Weight loss is concerning given advanced age and history COPD with emphysema.   I/Os: +892 ml since admit UOP: 300 ml since admit Medications reviewed and include: NaCl, Rocephin Labs reviewed  NUTRITION - FOCUSED PHYSICAL EXAM: Unable to complete at this time, RD working remotely.   Diet Order:   Diet Order            Diet Heart Room service appropriate? Yes;  Fluid consistency: Thin  Diet effective now              EDUCATION NEEDS:   No education needs have been identified at this time  Skin:  Skin Assessment: Reviewed RN Assessment  Last BM:  11/22  Height:   Ht Readings from Last 1 Encounters:  06/29/19 5\' 10"  (1.778 m)    Weight:   Wt Readings from Last 1 Encounters:  06/29/19 74.8 kg    Ideal Body Weight:  75.5 kg  BMI:  Body mass index is 23.68 kg/m.  Estimated Nutritional Needs:   Kcal:  1900-2100  Protein:  95-105  Fluid:  >/= 1.8 L/day   Lajuan Lines, RD, LDN Clinical Nutrition [place] Telephone [phone] After Hours/Weekend Pager: [phone]

## 2019-06-30 NOTE — Telephone Encounter (Signed)
Spoke to pt. He did go to an UC and was negative for Covid. Started vomiting. Went home and went to bed. Said he woke up and EMS was there. He had been vomiting in his sleep. He was admitted to the hospital and is feeling better. Told me he was told he had a bad bladder infection. He will call in early January to set up an appt with Dr Silvio Pate.

## 2019-06-30 NOTE — Evaluation (Signed)
Physical Therapy Evaluation Patient Details Name: Timothy Eaton MRN: [id number] DOB: June 08, 1941 Today's Date: 06/30/2019   History of Present Illness  Pt admitted for UTI with complaints of N/V, fever and syncopal episode. Pt now diagnosed with UTI. Other PMH includes HTN, GERD, COPD, and PVD.   Clinical Impression  Pt is a pleasant 78 year old male who was admitted for UTI. Pt demonstrates all bed mobility/transfers/ambulation at baseline level. Pt does not require any further PT needs at this time. Pt will be dc in house and does not require follow up. RN aware. Will dc current orders.     Follow Up Recommendations No PT follow up    Equipment Recommendations  None recommended by PT    Recommendations for Other Services       Precautions / Restrictions Precautions Precautions: Fall Restrictions Weight Bearing Restrictions: No      Mobility  Bed Mobility Overal bed mobility: Independent             General bed mobility comments: ease of transfer up to EOB. Once seated, upright posture noted  Transfers Overall transfer level: Independent Equipment used: None             General transfer comment: upright posture noted with safe technique. Pants able to be donned prior to transfer. No reports of dizziness with mobility. All mobility performed on RA  Ambulation/Gait Ambulation/Gait assistance: Supervision Gait Distance (Feet): 200 Feet Assistive device: None Gait Pattern/deviations: WFL(Within Functional Limits)     General Gait Details: quick reciprocal gait pattern with safe technique. Pt reports this speed is slightly faster than his normal.  O2 sats at 88% post exertion on room air  Stairs            Wheelchair Mobility    Modified Rankin (Stroke Patients Only)       Balance Overall balance assessment: Mild deficits observed, not formally tested                                           Pertinent Vitals/Pain Pain  Assessment: No/denies pain    Home Living Family/patient expects to be discharged to:: Private residence Living Arrangements: Spouse/significant other Available Help at Discharge: Family Type of Home: House         Home Equipment: None      Prior Function Level of Independence: Independent         Comments: does report 1 stagger, however no overt fall     Hand Dominance        Extremity/Trunk Assessment   Upper Extremity Assessment Upper Extremity Assessment: Overall WFL for tasks assessed    Lower Extremity Assessment Lower Extremity Assessment: Overall WFL for tasks assessed       Communication   Communication: No difficulties  Cognition Arousal/Alertness: Awake/alert Behavior During Therapy: WFL for tasks assessed/performed Overall Cognitive Status: Within Functional Limits for tasks assessed                                        General Comments      Exercises Other Exercises Other Exercises: ambulated to bathroom with supervision. Able to maintain static balance during hygiene tasks and combing his hair. Safe technique   Assessment/Plan    PT Assessment Patent does not need any further  PT services  PT Problem List         PT Treatment Interventions      PT Goals (Current goals can be found in the Care Plan section)  Acute Rehab PT Goals Patient Stated Goal: to go home PT Goal Formulation: All assessment and education complete, DC therapy Time For Goal Achievement: 06/30/19 Potential to Achieve Goals: Good    Frequency     Barriers to discharge        Co-evaluation               AM-PAC PT "6 Clicks" Mobility  Outcome Measure Help needed turning from your back to your side while in a flat bed without using bedrails?: None Help needed moving from lying on your back to sitting on the side of a flat bed without using bedrails?: None Help needed moving to and from a bed to a chair (including a wheelchair)?:  None Help needed standing up from a chair using your arms (e.g., wheelchair or bedside chair)?: None Help needed to walk in hospital room?: None Help needed climbing 3-5 steps with a railing? : None 6 Click Score: 24    End of Session Equipment Utilized During Treatment: Gait belt Activity Tolerance: Patient tolerated treatment well Patient left: in chair Nurse Communication: Mobility status PT Visit Diagnosis: Unsteadiness on feet (R26.81)    Time: [id number] PT Time Calculation (min) (ACUTE ONLY): 18 min   Charges:   PT Evaluation $PT Eval Low Complexity: 1 Low PT Treatments $Therapeutic Activity: 8-22 mins        Greggory Stallion, PT, DPT [phone]   [name] 06/30/2019, 4:07 PM

## 2019-06-30 NOTE — Plan of Care (Signed)
  Problem: Education: Goal: Knowledge of General Education information will improve Description Including pain rating scale, medication(s)/side effects and non-pharmacologic comfort measures Outcome: Progressing   

## 2019-07-01 ENCOUNTER — Other Ambulatory Visit: Payer: Self-pay

## 2019-07-01 DIAGNOSIS — J439 Emphysema, unspecified: Secondary | ICD-10-CM

## 2019-07-01 LAB — CBC
HCT: 37.9 % — ABNORMAL LOW (ref 39.0–52.0)
Hemoglobin: 13 g/dL (ref 13.0–17.0)
MCH: 30 pg (ref 26.0–34.0)
MCHC: 34.3 g/dL (ref 30.0–36.0)
MCV: 87.5 fL (ref 80.0–100.0)
Platelets: 164 10*3/uL (ref 150–400)
RBC: 4.33 MIL/uL (ref 4.22–5.81)
RDW: 12.6 % (ref 11.5–15.5)
WBC: 5.9 10*3/uL (ref 4.0–10.5)
nRBC: 0 % (ref 0.0–0.2)

## 2019-07-01 LAB — BASIC METABOLIC PANEL
Anion gap: 9 (ref 5–15)
BUN: 21 mg/dL (ref 8–23)
CO2: 22 mmol/L (ref 22–32)
Calcium: 7.9 mg/dL — ABNORMAL LOW (ref 8.9–10.3)
Chloride: 109 mmol/L (ref 98–111)
Creatinine, Ser: 1.11 mg/dL (ref 0.61–1.24)
GFR calc Af Amer: >60 mL/min (ref >60)
GFR calc non Af Amer: >60 mL/min (ref >60)
Glucose, Bld: 99 mg/dL (ref 70–99)
Potassium: 3.8 mmol/L (ref 3.5–5.1)
Sodium: 140 mmol/L (ref 135–145)

## 2019-07-01 LAB — URINE CULTURE: Culture: <10000 — AB

## 2019-07-01 MED ORDER — LEVOFLOXACIN 500 MG PO TABS: 500.0000 mg | ORAL_TABLET | Freq: Every day | ORAL | 0 refills | Status: AC

## 2019-07-01 NOTE — Progress Notes (Signed)
Notified Dr. Blaine Hamper of patients BP being low this AM. Ordered to hold BP medications.

## 2019-07-01 NOTE — Care Management Important Message (Signed)
Important Message  Patient Details  Name: Timothy Eaton MRN: [id number] Date of Birth: Dec 20, 1940   Medicare Important Message Given:  Yes  Initial Medicare IM given by Patient Access Associate on 06/30/2019 at 10:31am.  Still valid.   Dannette Barbara 07/01/2019, 8:15 AM

## 2019-07-01 NOTE — Discharge Instructions (Signed)
You were cared for by a hospitalist during your hospital stay. If you have any questions about your discharge medications or the care you received while you were in the hospital after you are discharged, you can call the unit and ask to speak with the hospitalist on call if the hospitalist that took care of you is not available. Once you are discharged, your primary care physician will handle any further medical issues. Please note that NO REFILLS for any discharge medications will be authorized once you are discharged, as it is imperative that you return to your primary care physician (or establish a relationship with a primary care physician if you do not have one) for your aftercare needs so that they can reassess your need for medications and monitor your lab values.  Follow up with PCP and cardiologist take all medications as prescribed. If symptoms change or worsen please return to the ED for evaluation

## 2019-07-01 NOTE — Progress Notes (Signed)
Ortencia Kick Ent A and O x4. VSS. Pt tolerating diet well. No complaints of nausea or vomiting. IV removed intact, prescriptions given. Pt voices understanding of discharge instructions with no further questions. Patient discharged via wheelchair with NT  Allergies as of 07/01/2019      Reactions   Sulfamethoxazole-trimethoprim    REACTION: rash      Medication List    STOP taking these medications   cefdinir 300 MG capsule Commonly known as: OMNICEF     TAKE these medications   aspirin 81 MG EC tablet Take 81 mg by mouth daily.   diltiazem 240 MG 24 hr capsule Commonly known as: CARDIZEM CD TAKE 1 CAPSULE BY MOUTH EVERY DAY What changed: how much to take   levofloxacin 500 MG tablet Commonly known as: LEVAQUIN Take 1 tablet (500 mg total) by mouth daily for 3 days.   metoprolol tartrate 25 MG tablet Commonly known as: LOPRESSOR TAKE 1 TABLET BY MOUTH TWICE DAILY. PLEASE KEEP APPOINTMENT IN JULY WITH DOCTOR NAHSER BFOR FUTURE REFILLS What changed: See the new instructions.   ondansetron 4 MG disintegrating tablet Commonly known as: ZOFRAN-ODT Take 4 mg by mouth every 8 (eight) hours as needed for nausea.   rosuvastatin 20 MG tablet Commonly known as: CRESTOR TAKE 1 TABLET BY MOUTH ONCE DAILY       Vitals:   07/01/19 0456 07/01/19 0914  BP: 127/79 101/65  Pulse: 85 81  Resp: 20   Temp: 99.1 F (37.3 C)   SpO2: 96%     Brittan Mapel C Itzael Liptak

## 2019-07-01 NOTE — Discharge Summary (Signed)
Physician Discharge Summary  SHERLEY Eaton [id number] DOB: 04-Feb-1941 DOA: 06/29/2019  PCP: [name], MD  Admit date: 06/29/2019 Discharge date: 07/01/2019  Recommendations for Outpatient Follow-up:  1. Follow up with PCP in 1 weeks 2. Please obtain BMP/CBC in one week 3. Please follow up on the following pending results: blood culture and urine culture  Home Health: none Equipment/Devices: none    Discharge Condition: stable CODE STATUS: full  Diet recommendation: heart healthy diest  Brief/Interim Summary (HPI)  Keimani Eaton  is a 78 y.o. pleasant Caucasian male with a known history of multiple neuro problems including hypertension, dyslipidemia, GERD, COPD, peripheral vascular disease and atrial fibrillation, presented to the emergency room with onset of fever since yesterday as well as dysuria with diminished urine output as well as nocturia last night.  He denied any abdominal pain or flank pain.  He had nausea and vomiting yesterday without diarrhea.  He was seen in [facility] and was given a shot of antibiotic and a prescription for Omnicef and Zofran.  This afternoon his wife was waking him up and he was confused and incoherent and was vomiting.  He has been feeling generally weak.  He has not had any appetite today.  No cough or wheezing or dyspnea or palpitations or chest pain.  No recent sick exposures to COVID-19.  Upon presentation to the emergency room, temperature was 100.1 and heart rate was 91 respiratory rate 18 and later 27 and pulse oximetry 96% on 2 L of O2 by nasal cannula and later 94%.  Later on pulse oximetry was down to 89% room air.  Labs revealed potassium of 3.6 BUN 26 with creatinine 1.36.  Lactic acid was 2.6 and high-sensitivity troponin I was 86.  Urinalysis was strongly positive for UTI.  CBC was unremarkable.  COVID-19 test is currently pending.  Portable chest x-ray showed emphysematous changes with no acute cardiopulmonary disease.  Blood  cultures were sent.  EKG showed normal sinus rhythm with a rate of 90.  The patient was given 1 L bolus of IV normal saline and 1 g of IV Rocephin.  He will be admitted to medical monitored bed for further evaluation and management.  Subjective  Patient's mental status is back to baseline.  No confusion.  Oriented x3.  Denies nausea, vomiting, diarrhea, abdominal pain.  No fever or chills.  No dysuria.  No unilateral weakness or numbness in extremities.  He feels good and wants to go home.   Discharge Diagnoses and Hospital Course:   Principal Problem:   UTI (urinary tract infection) Active Problems:   Hyperlipidemia   Essential hypertension   COPD (chronic obstructive pulmonary disease) with emphysema (HCC)   Coronary artery disease   Atrial fibrillation (HCC)   Sepsis (HCC)   Elevated troponin  Sepsis secondary to UTI, without septic shock. This is manifested by tachypnea with respiratory to 27 and tachycardia of 94 with associated lactic acid of 2.6 and altered mental status on admission. Lactic acid is still elevated at 2.1, which has normalized at discharge. mental status is back to baseline. Procalcitonin was 16.77. Blood culture and urine culture pending, no growth so far.  -treated with IVF and IV Rocephin in hosptital -->will discharge on Levaquin for total of 5 days of antibiotics -f/u blood culture  Metabolic encephalopathy.This is mostly likely secondary to his sepsis and UTI.  Mental status is back to normal  Hypertension. -Continue home cardizme and metoprolol  Dyslipidemia.  -Crestor  Mild COPD.  No current exacerbation.  -continue home meds  Hx of Coronary artery disease and Elevated troponin: Trop 347 -->203 -->166 -->152.  Patient denies chest pain.  Possibly due to demand ischemia secondary to sepsis -Aspirin and Crestor -Recommended to follow-up with his cardiologist closely  Hx of Atrial fibrillation ([place]): not on anticoagulants. -on  metoprolol and Cardizem    Discharge Instructions  Discharge Instructions    Call MD for:  difficulty breathing, headache or visual disturbances   Complete by: As directed    Call MD for:  persistant dizziness or light-headedness   Complete by: As directed    Call MD for:  redness, tenderness, or signs of infection (pain, swelling, redness, odor or green/yellow discharge around incision site)   Complete by: As directed    Call MD for:  severe uncontrolled pain   Complete by: As directed    Call MD for:  temperature >100.4   Complete by: As directed    Increase activity slowly   Complete by: As directed      Allergies as of 07/01/2019      Reactions   Sulfamethoxazole-trimethoprim    REACTION: rash      Medication List    STOP taking these medications   cefdinir 300 MG capsule Commonly known as: OMNICEF     TAKE these medications   aspirin 81 MG EC tablet Take 81 mg by mouth daily.   diltiazem 240 MG 24 hr capsule Commonly known as: CARDIZEM CD TAKE 1 CAPSULE BY MOUTH EVERY DAY What changed: how much to take   levofloxacin 500 MG tablet Commonly known as: LEVAQUIN Take 1 tablet (500 mg total) by mouth daily for 3 days.   metoprolol tartrate 25 MG tablet Commonly known as: LOPRESSOR TAKE 1 TABLET BY MOUTH TWICE DAILY. PLEASE KEEP APPOINTMENT IN JULY WITH DOCTOR NAHSER BFOR FUTURE REFILLS What changed: See the new instructions.   ondansetron 4 MG disintegrating tablet Commonly known as: ZOFRAN-ODT Take 4 mg by mouth every 8 (eight) hours as needed for nausea.   rosuvastatin 20 MG tablet Commonly known as: CRESTOR TAKE 1 TABLET BY MOUTH ONCE DAILY      Follow-up Information    Viviana Simpler I, MD Follow up in 7 week(s).   Specialties: Internal Medicine, Pediatrics Contact information: [place] [address] [phone]        Nahser, Wonda Cheng, MD .   Specialty: Cardiology Contact information: [address] [place] [address] [phone]          Allergies  Allergen Reactions  . Sulfamethoxazole-Trimethoprim     REACTION: rash    Consultations:  none   Procedures/Studies: Dg Chest Portable 1 View  Result Date: 06/29/2019 CLINICAL DATA:  Fever and weakness. EXAM: PORTABLE CHEST 1 VIEW COMPARISON:  September 15, 2012 FINDINGS: There are severe emphysematous changes bilaterally. There are areas of architectural distortion and scarring involving the bilateral lower lobes and left upper lobe. There is no obvious pneumothorax. No focal infiltrate. The heart size is normal. Aortic calcifications are noted. There is no acute osseous abnormality. IMPRESSION: 1. Severe emphysematous changes bilaterally. 2. No obvious pneumothorax or focal infiltrate. Electronically Signed   By: Constance Holster M.D.   On: 06/29/2019 19:08      Discharge Exam: Vitals:   07/01/19 0456 07/01/19 0914  BP: 127/79 101/65  Pulse: 85 81  Resp: 20   Temp: 99.1 F (37.3 C)   SpO2: 96%  Vitals:   06/30/19 1153 06/30/19 2008 07/01/19 0456 07/01/19 0914  BP: 112/72 139/75 127/79 101/65  Pulse: 67 76 85 81  Resp:  18 20   Temp: 97.6 F (36.4 C) 98.2 F (36.8 C) 99.1 F (37.3 C)   TempSrc: Oral Oral Oral   SpO2: 98% 96% 96%   Weight:      Height:        General: Pt is alert, awake, not in acute distress Cardiovascular: RRR, S1/S2 +, no rubs, no gallops Respiratory: CTA bilaterally, no wheezing, no rhonchi Abdominal: Soft, NT, ND, bowel sounds + Extremities: no edema, no cyanosis    The results of significant diagnostics from this hospitalization (including imaging, microbiology, ancillary and laboratory) are listed below for reference.     Microbiology: Recent Results (from the past 240 hour(s))  Blood Culture (routine x 2)     Status: None (Preliminary result)   Collection Time: 06/29/19  5:58 PM   Specimen: BLOOD  Result Value Ref Range Status   Specimen Description BLOOD BLOOD  LEFT ARM  Final   Special Requests   Final    BOTTLES DRAWN AEROBIC AND ANAEROBIC Blood Culture adequate volume   Culture   Final    NO GROWTH 2 DAYS Performed at [facility], [address] [place], [place] [address]    Report Status PENDING  Incomplete  SARS CORONAVIRUS 2 (TAT 6-24 HRS) Nasopharyngeal Nasopharyngeal Swab     Status: None   Collection Time: 06/29/19  7:14 PM   Specimen: Nasopharyngeal Swab  Result Value Ref Range Status   SARS Coronavirus 2 NEGATIVE NEGATIVE Final    Comment: (NOTE) SARS-CoV-2 target nucleic acids are NOT DETECTED. The SARS-CoV-2 RNA is generally detectable in upper and lower respiratory specimens during the acute phase of infection. Negative results do not preclude SARS-CoV-2 infection, do not rule out co-infections with other pathogens, and should not be used as the sole basis for treatment or other patient management decisions. Negative results must be combined with clinical observations, patient history, and epidemiological information. The expected result is Negative. Fact Sheet for Patients: SugarRoll.be Fact Sheet for Healthcare Providers: https://www.woods-mathews.com/ This test is not yet approved or cleared by the [place] FDA and  has been authorized for detection and/or diagnosis of SARS-CoV-2 by FDA under an Emergency Use Authorization (EUA). This EUA will remain  in effect (meaning this test can be used) for the duration of the COVID-19 declaration under Section 56 4(b)(1) of the Act, 21 U.S.C. section 360bbb-3(b)(1), unless the authorization is terminated or revoked sooner. Performed at [facility], [place] [address] [place], [place] [address]   Blood Culture (routine x 2)     Status: None (Preliminary result)   Collection Time: 06/29/19  8:25 PM   Specimen: BLOOD  Result Value Ref Range Status   Specimen Description BLOOD BLOOD RIGHT FOREARM  Final   Special  Requests   Final    BOTTLES DRAWN AEROBIC AND ANAEROBIC Blood Culture adequate volume   Culture   Final    NO GROWTH 2 DAYS Performed at [facility], [address] [place], [place] [address]    Report Status PENDING  Incomplete     Labs: BNP (last 3 results) No results for input(s): BNP in the last 8760 hours. Basic Metabolic Panel: Recent Labs  Lab 06/29/19 1758 06/29/19 2234 06/30/19 0458 07/01/19 0443  NA 137 138 141 140  K 3.6 3.8 4.0 3.8  CL 104 105 107  109  CO2 20* 24 25 22   GLUCOSE 146* 149* 108* 99  BUN 26* 25* 24* 21  CREATININE 1.34* 1.35* 1.22 1.11  CALCIUM 8.9 8.4* 8.6* 7.9*   Liver Function Tests: Recent Labs  Lab 06/29/19 1758  AST 29  ALT 11  ALKPHOS 60  BILITOT 1.1  PROT 6.6  ALBUMIN 3.5   Recent Labs  Lab 06/29/19 1758  LIPASE 28   No results for input(s): AMMONIA in the last 168 hours. CBC: Recent Labs  Lab 06/29/19 1758 06/30/19 0458 07/01/19 0443  WBC 5.1 8.2 5.9  NEUTROABS 4.5  --   --   HGB 14.2 13.6 13.0  HCT 41.3 42.1 37.9*  MCV 87.9 92.5 87.5  PLT 156 153 164   Cardiac Enzymes: No results for input(s): CKTOTAL, CKMB, CKMBINDEX, TROPONINI in the last 168 hours. BNP: Invalid input(s): POCBNP CBG: No results for input(s): GLUCAP in the last 168 hours. D-Dimer No results for input(s): DDIMER in the last 72 hours. Hgb A1c No results for input(s): HGBA1C in the last 72 hours. Lipid Profile No results for input(s): CHOL, HDL, LDLCALC, TRIG, CHOLHDL, LDLDIRECT in the last 72 hours. Thyroid function studies No results for input(s): TSH, T4TOTAL, T3FREE, THYROIDAB in the last 72 hours.  Invalid input(s): FREET3 Anemia work up No results for input(s): VITAMINB12, FOLATE, FERRITIN, TIBC, IRON, RETICCTPCT in the last 72 hours. Urinalysis    Component Value Date/Time   COLORURINE AMBER (A) 06/29/2019 1835   APPEARANCEUR CLOUDY (A) 06/29/2019 1835   LABSPEC 1.026 06/29/2019 1835   PHURINE 5.0 06/29/2019 1835    GLUCOSEU NEGATIVE 06/29/2019 1835   HGBUR MODERATE (A) 06/29/2019 1835   BILIRUBINUR NEGATIVE 06/29/2019 1835   BILIRUBINUR Large 10/29/2013 1811   KETONESUR 20 (A) 06/29/2019 1835   PROTEINUR 100 (A) 06/29/2019 1835   UROBILINOGEN negative 10/29/2013 1811   NITRITE POSITIVE (A) 06/29/2019 1835   LEUKOCYTESUR MODERATE (A) 06/29/2019 1835   Sepsis Labs Invalid input(s): PROCALCITONIN,  WBC,  LACTICIDVEN Microbiology Recent Results (from the past 240 hour(s))  Blood Culture (routine x 2)     Status: None (Preliminary result)   Collection Time: 06/29/19  5:58 PM   Specimen: BLOOD  Result Value Ref Range Status   Specimen Description BLOOD BLOOD LEFT ARM  Final   Special Requests   Final    BOTTLES DRAWN AEROBIC AND ANAEROBIC Blood Culture adequate volume   Culture   Final    NO GROWTH 2 DAYS Performed at [facility], [address], [place], [place] [address]    Report Status PENDING  Incomplete  SARS CORONAVIRUS 2 (TAT 6-24 HRS) Nasopharyngeal Nasopharyngeal Swab     Status: None   Collection Time: 06/29/19  7:14 PM   Specimen: Nasopharyngeal Swab  Result Value Ref Range Status   SARS Coronavirus 2 NEGATIVE NEGATIVE Final    Comment: (NOTE) SARS-CoV-2 target nucleic acids are NOT DETECTED. The SARS-CoV-2 RNA is generally detectable in upper and lower respiratory specimens during the acute phase of infection. Negative results do not preclude SARS-CoV-2 infection, do not rule out co-infections with other pathogens, and should not be used as the sole basis for treatment or other patient management decisions. Negative results must be combined with clinical observations, patient history, and epidemiological information. The expected result is Negative. Fact Sheet for Patients: SugarRoll.be Fact Sheet for Healthcare Providers: https://www.woods-mathews.com/ This test is not yet approved or cleared by the [place] FDA and   has been authorized for detection and/or diagnosis  of SARS-CoV-2 by FDA under an Emergency Use Authorization (EUA). This EUA will remain  in effect (meaning this test can be used) for the duration of the COVID-19 declaration under Section 56 4(b)(1) of the Act, 21 U.S.C. section 360bbb-3(b)(1), unless the authorization is terminated or revoked sooner. Performed at [facility], [place] [address] [place], [place] [address]   Blood Culture (routine x 2)     Status: None (Preliminary result)   Collection Time: 06/29/19  8:25 PM   Specimen: BLOOD  Result Value Ref Range Status   Specimen Description BLOOD BLOOD RIGHT FOREARM  Final   Special Requests   Final    BOTTLES DRAWN AEROBIC AND ANAEROBIC Blood Culture adequate volume   Culture   Final    NO GROWTH 2 DAYS Performed at [facility], [address] [place],  [address]    Report Status PENDING  Incomplete    Time coordinating discharge:  35 minutes.   SIGNED:  Ivor Costa, DO [facility] 07/01/2019, 10:42 AM Pager is on [place]  If 7PM-7AM, please contact night-coverage [email or web address] Password [email or web address]

## 2019-07-04 LAB — CULTURE, BLOOD (ROUTINE X 2)
Culture: NO GROWTH
Culture: NO GROWTH
Special Requests: ADEQUATE
Special Requests: ADEQUATE

## 2019-07-06 ENCOUNTER — Telehealth: Payer: Self-pay

## 2019-07-06 NOTE — Telephone Encounter (Signed)
Transition Care Management Follow-up Telephone Call  Date of discharge and from where: 07/01/2019, [facility]  How have you been since you were released from the hospital? Patient states that he is feeling much better. He believes the antibiotic has completely cleared up his infection.   Any questions or concerns? No   Items Reviewed:  Did the pt receive and understand the discharge instructions provided? Yes   Medications obtained and verified? Yes   Any new allergies since your discharge? Yes   Dietary orders reviewed? Yes  Do you have support at home? Yes   Functional Questionnaire: (I = Independent and D = Dependent) ADLs: I  Bathing/Dressing- I  Meal Prep- I  Eating- I  Maintaining continence- I  Transferring/Ambulation- I  Managing Meds- I  Follow up appointments reviewed:   PCP Hospital f/u appt confirmed? Yes  Scheduled to see Dr. Silvio Pate on 07/07/2019 @ 11:30 am.  [facility] f/u appt confirmed? N/A  Are transportation arrangements needed? No   If their condition worsens, is the pt aware to call PCP or go to the Emergency Dept.? Yes  Was the patient provided with contact information for the PCP's office or ED? Yes  Was to pt encouraged to call back with questions or concerns? Yes

## 2019-07-07 ENCOUNTER — Other Ambulatory Visit: Payer: Self-pay

## 2019-07-07 ENCOUNTER — Encounter: Payer: Self-pay | Admitting: Internal Medicine

## 2019-07-07 ENCOUNTER — Ambulatory Visit (INDEPENDENT_AMBULATORY_CARE_PROVIDER_SITE_OTHER): Payer: Medicare Other | Admitting: Internal Medicine

## 2019-07-07 DIAGNOSIS — N138 Other obstructive and reflux uropathy: Secondary | ICD-10-CM | POA: Diagnosis not present

## 2019-07-07 DIAGNOSIS — I6523 Occlusion and stenosis of bilateral carotid arteries: Secondary | ICD-10-CM

## 2019-07-07 DIAGNOSIS — N401 Enlarged prostate with lower urinary tract symptoms: Secondary | ICD-10-CM

## 2019-07-07 DIAGNOSIS — N390 Urinary tract infection, site not specified: Secondary | ICD-10-CM

## 2019-07-07 MED ORDER — TAMSULOSIN HCL 0.4 MG PO CAPS: 0.4000 mg | ORAL_CAPSULE | Freq: Every day | ORAL | 3 refills | Status: DC

## 2019-07-07 NOTE — Assessment & Plan Note (Signed)
With apparent sepsis syndrome Urine culture negative but he had been treated at urgent care Finished with his Rx and feels "great" now

## 2019-07-07 NOTE — Patient Instructions (Signed)
You can try over the counter fluticasone spray for the runny nose.

## 2019-07-07 NOTE — Progress Notes (Signed)
Subjective:    Patient ID: Timothy Eaton, male    DOB: July 05, 1941, 78 y.o.   MRN: [id number]  HPI Here for hospital follow up  Awoke and felt feverish Wife called up here--- instructed to get seen at clinic Started vomiting Seen at urgent care---given antibiotic and sent home Then passed out at home Wife called 911---to ER  Diagnosed with urosepsis due to abnormal urinalysis, etc Demand ischemia with elevated troponin Treated with antibiotics and then able to go home Feels better now  No chest pain No palpitations No stroke symptoms  Has noticed dribbling and delayed stream Seems better now  Current Outpatient Medications on File Prior to Visit  Medication Sig Dispense Refill  . aspirin 81 MG EC tablet Take 81 mg by mouth daily.      Marland Kitchen diltiazem (CARDIZEM CD) 240 MG 24 hr capsule TAKE 1 CAPSULE BY MOUTH EVERY DAY (Patient taking differently: Take 240 mg by mouth daily. ) 90 capsule 2  . metoprolol tartrate (LOPRESSOR) 25 MG tablet TAKE 1 TABLET BY MOUTH TWICE DAILY. PLEASE KEEP APPOINTMENT IN JULY WITH DOCTOR NAHSER BFOR FUTURE REFILLS (Patient taking differently: Take 25 mg by mouth 2 (two) times daily. ) 180 tablet 2  . rosuvastatin (CRESTOR) 20 MG tablet TAKE 1 TABLET BY MOUTH ONCE DAILY (Patient taking differently: Take 20 mg by mouth daily. ) 90 tablet 3   No current facility-administered medications on file prior to visit.     Allergies  Allergen Reactions  . Sulfamethoxazole-Trimethoprim     REACTION: rash    Past Medical History:  Diagnosis Date  . AAA (abdominal aortic aneurysm) ([place])    [name], Repaired, 2005  . Atrial fibrillation (HCC)    Postoperative in past  . Carotid artery disease ([place])    Minimal, doppler 2004, 0-39% bilateral, doppler 05/17/09 0-39% bilateral  . COPD (chronic obstructive pulmonary disease) ([place])   . Coronary artery disease    Moderate, catheterization 2005, nuclear scan 9/09 no ischemia, EF 60%, echo  . ED (erectile  dysfunction)   . Ejection fraction    EF 60% echo, September, 2009  . GERD (gastroesophageal reflux disease)   . Hemorrhoids   . Hyperlipidemia   . Hypertension   . Lung nodule    Followup by pulmonary in past  . Palpitations    History palpitations in the past  . Palpitations    January, 2013  . Shortness of breath     Past Surgical History:  Procedure Laterality Date  . ABDOMINAL AORTIC ANEURYSM REPAIR  04/06/04   Dr. [name]  . BACK SURGERY    . CHOLECYSTECTOMY  11/26/02  . PILONIDAL CYST EXCISION  11/11    Family History  Problem Relation Age of Onset  . Hypertension Mother        ?  . Stroke Mother        CVA  . Diabetes Mother        DM  . Heart attack Father        MI  . Cancer Sister        Gall bladder   . Stroke Other        All mom's family died of strokes    Social History   Socioeconomic History  . Marital status: Married    Spouse name: Not on file  . Number of children: 3  . Years of education: Not on file  . Highest education level: Not on file  Occupational History  .  Occupation: Truck Geophysicist/field seismologist    Comment: Retired  Scientific laboratory technician  . Financial resource strain: Not on file  . Food insecurity    Worry: Not on file    Inability: Not on file  . Transportation needs    Medical: Not on file    Non-medical: Not on file  Tobacco Use  . Smoking status: Former Smoker    Quit date: 08/06/2002    Years since quitting: 16.9  . Smokeless tobacco: Current User    Types: Chew  Substance and Sexual Activity  . Alcohol use: No  . Drug use: No  . Sexual activity: Not on file  Lifestyle  . Physical activity    Days per week: Not on file    Minutes per session: Not on file  . Stress: Not on file  Relationships  . Social Herbalist on phone: Not on file    Gets together: Not on file    Attends religious service: Not on file    Active member of club or organization: Not on file    Attends meetings of clubs or organizations: Not on file     Relationship status: Not on file  . Intimate partner violence    Fear of current or ex partner: Not on file    Emotionally abused: Not on file    Physically abused: Not on file    Forced sexual activity: Not on file  Other Topics Concern  . Not on file  Social History Narrative   No living will   Would want wife--then daughter Joelene Millin-- to make decisions for him.   Would accept resuscitation   No feeding tube if cognitively unaware   Review of Systems Has persistent clear rhinorrhea AM and night. No allergy problems in past Appetite is good Weight is stable     Objective:   Physical Exam  Constitutional: He appears well-developed. No distress.  Neck: No thyromegaly present.  Cardiovascular: Normal rate, regular rhythm and normal heart sounds. Exam reveals no gallop.  No murmur heard. Respiratory: Effort normal and breath sounds normal. No respiratory distress. He has no wheezes. He has no rales.  GI: Soft. There is no abdominal tenderness.  Musculoskeletal:        General: No edema.     Comments: No CVA tenderness  Lymphadenopathy:    He has no cervical adenopathy.           Assessment & Plan:

## 2019-07-07 NOTE — Assessment & Plan Note (Signed)
Known past stenosis Continues on asa and statin

## 2019-07-07 NOTE — Assessment & Plan Note (Signed)
Presumed some level of obstruction to get severe UTI Will start tamsulosin

## 2019-12-09 ENCOUNTER — Other Ambulatory Visit: Payer: Self-pay

## 2019-12-09 ENCOUNTER — Ambulatory Visit (INDEPENDENT_AMBULATORY_CARE_PROVIDER_SITE_OTHER): Payer: Medicare Other | Admitting: Internal Medicine

## 2019-12-09 ENCOUNTER — Encounter: Payer: Self-pay | Admitting: Internal Medicine

## 2019-12-09 VITALS — BP 122/82 | HR 82 | Temp 98.7°F | Ht 69.0 in | Wt 163.0 lb

## 2019-12-09 DIAGNOSIS — R413 Other amnesia: Secondary | ICD-10-CM | POA: Diagnosis not present

## 2019-12-09 DIAGNOSIS — N390 Urinary tract infection, site not specified: Secondary | ICD-10-CM

## 2019-12-09 LAB — POC URINALSYSI DIPSTICK (AUTOMATED)
Blood, UA: NEGATIVE
Glucose, UA: NEGATIVE
Nitrite, UA: NEGATIVE
Protein, UA: POSITIVE — AB
Spec Grav, UA: 1.015 (ref 1.010–1.025)
Urobilinogen, UA: 1 E.U./dL
pH, UA: 7.5 (ref 5.0–8.0)

## 2019-12-09 LAB — COMPREHENSIVE METABOLIC PANEL
ALT: 9 U/L (ref 0–53)
AST: 14 U/L (ref 0–37)
Albumin: 3.9 g/dL (ref 3.5–5.2)
Alkaline Phosphatase: 73 U/L (ref 39–117)
BUN: 28 mg/dL — ABNORMAL HIGH (ref 6–23)
CO2: 30 mEq/L (ref 19–32)
Calcium: 9.2 mg/dL (ref 8.4–10.5)
Chloride: 104 mEq/L (ref 96–112)
Creatinine, Ser: 2.13 mg/dL — ABNORMAL HIGH (ref 0.40–1.50)
GFR: 30.03 mL/min — ABNORMAL LOW (ref >60.00)
Glucose, Bld: 119 mg/dL — ABNORMAL HIGH (ref 70–99)
Potassium: 4.1 mEq/L (ref 3.5–5.1)
Sodium: 140 mEq/L (ref 135–145)
Total Bilirubin: 1 mg/dL (ref 0.2–1.2)
Total Protein: 6.2 g/dL (ref 6.0–8.3)

## 2019-12-09 LAB — CBC
HCT: 46.5 % (ref 39.0–52.0)
Hemoglobin: 15.6 g/dL (ref 13.0–17.0)
MCHC: 33.5 g/dL (ref 30.0–36.0)
MCV: 92.3 fl (ref 78.0–100.0)
Platelets: 202 10*3/uL (ref 150.0–400.0)
RBC: 5.04 Mil/uL (ref 4.22–5.81)
RDW: 14.1 % (ref 11.5–15.5)
WBC: 9.8 10*3/uL (ref 4.0–10.5)

## 2019-12-09 LAB — VITAMIN B12: Vitamin B-12: 171 pg/mL — ABNORMAL LOW (ref 211–911)

## 2019-12-09 LAB — T4, FREE: Free T4: 0.97 ng/dL (ref 0.60–1.60)

## 2019-12-09 MED ORDER — CEFTRIAXONE SODIUM 1 G IJ SOLR
1.0000 g | Freq: Once | INTRAMUSCULAR | Status: AC
  Administered 2019-12-09: 1 g via INTRAMUSCULAR

## 2019-12-09 MED ORDER — CEFUROXIME AXETIL 500 MG PO TABS: 500.0000 mg | ORAL_TABLET | Freq: Two times a day (BID) | ORAL | 0 refills | Status: DC

## 2019-12-09 NOTE — Addendum Note (Signed)
Addended by: Pilar Grammes on: 12/09/2019 02:18 PM   Modules accepted: Orders

## 2019-12-09 NOTE — Assessment & Plan Note (Signed)
Equivocal leukocytes on urinalysis but symptoms similar to past episode with apparent urosepsis Will check culture Empiric Rx with rocephin x 1--then cefuroxime Plan urology evaluation if culture positive

## 2019-12-09 NOTE — Progress Notes (Signed)
Subjective:    Patient ID: Timothy Eaton, male    DOB: 02/19/1941, 79 y.o.   MRN: [id number]  HPI Here with wife due to concerns of recurrent UTI This visit occurred during the SARS-CoV-2 public health emergency.  Safety protocols were in place, including screening questions prior to the visit, additional usage of staff PPE, and extensive cleaning of exam room while observing appropriate contact time as indicated for disinfecting solutions.   With wife  Last night-- had cheeseburger for dinner Then decided to lie down--felt nauseated and then threw up All night--pain in left CVA area Didn't sleep at all Still nauseated this morning--vomited around 6AM Now feels better Urine dark---thought there may have been blood in it No clear dysuria  Taking tamsulosin Feels he empties his bladder pretty well Slight dribbling at end Nocturia at times  Wife concerned about his memory loss Worsening now Didn't even remember what he was coming to the doctor for today  Current Outpatient Medications on File Prior to Visit  Medication Sig Dispense Refill  . aspirin 81 MG EC tablet Take 81 mg by mouth daily.      Marland Kitchen diltiazem (CARDIZEM CD) 240 MG 24 hr capsule TAKE 1 CAPSULE BY MOUTH EVERY DAY (Patient taking differently: Take 240 mg by mouth daily. ) 90 capsule 2  . metoprolol tartrate (LOPRESSOR) 25 MG tablet TAKE 1 TABLET BY MOUTH TWICE DAILY. PLEASE KEEP APPOINTMENT IN JULY WITH DOCTOR NAHSER BFOR FUTURE REFILLS (Patient taking differently: Take 25 mg by mouth 2 (two) times daily. ) 180 tablet 2  . rosuvastatin (CRESTOR) 20 MG tablet TAKE 1 TABLET BY MOUTH ONCE DAILY (Patient taking differently: Take 20 mg by mouth daily. ) 90 tablet 3  . tamsulosin (FLOMAX) 0.4 MG CAPS capsule Take 1 capsule (0.4 mg total) by mouth daily. 90 capsule 3   No current facility-administered medications on file prior to visit.    Allergies  Allergen Reactions  . Sulfamethoxazole-Trimethoprim     REACTION: rash      Past Medical History:  Diagnosis Date  . AAA (abdominal aortic aneurysm) ([place])    [name], Repaired, 2005  . Atrial fibrillation (HCC)    Postoperative in past  . Carotid artery disease ([place])    Minimal, doppler 2004, 0-39% bilateral, doppler 05/17/09 0-39% bilateral  . COPD (chronic obstructive pulmonary disease) ([place])   . Coronary artery disease    Moderate, catheterization 2005, nuclear scan 9/09 no ischemia, EF 60%, echo  . ED (erectile dysfunction)   . Ejection fraction    EF 60% echo, September, 2009  . GERD (gastroesophageal reflux disease)   . Hemorrhoids   . Hyperlipidemia   . Hypertension   . Lung nodule    Followup by pulmonary in past  . Palpitations    History palpitations in the past  . Palpitations    January, 2013  . Shortness of breath     Past Surgical History:  Procedure Laterality Date  . ABDOMINAL AORTIC ANEURYSM REPAIR  04/06/04   Dr. [name]  . BACK SURGERY    . CHOLECYSTECTOMY  11/26/02  . PILONIDAL CYST EXCISION  11/11    Family History  Problem Relation Age of Onset  . Hypertension Mother        ?  . Stroke Mother        CVA  . Diabetes Mother        DM  . Heart attack Father        MI  .  Cancer Sister        Gall bladder   . Stroke Other        All mom's family died of strokes    Social History   Socioeconomic History  . Marital status: Married    Spouse name: Not on file  . Number of children: 3  . Years of education: Not on file  . Highest education level: Not on file  Occupational History  . Occupation: Truck Geophysicist/field seismologist    Comment: Retired  Tobacco Use  . Smoking status: Former Smoker    Quit date: 08/06/2002    Years since quitting: 17.3  . Smokeless tobacco: Current User    Types: Chew  Substance and Sexual Activity  . Alcohol use: No  . Drug use: No  . Sexual activity: Not on file  Other Topics Concern  . Not on file  Social History Narrative   No living will   Would want wife--then daughter Joelene Millin-- to make  decisions for him.   Would accept resuscitation   No feeding tube if cognitively unaware   Social Determinants of Health   Financial Resource Strain:   . Difficulty of Paying Living Expenses:   Food Insecurity:   . Worried About Charity fundraiser in the Last Year:   . Arboriculturist in the Last Year:   Transportation Needs:   . Film/video editor (Medical):   Marland Kitchen Lack of Transportation (Non-Medical):   Physical Activity:   . Days of Exercise per Week:   . Minutes of Exercise per Session:   Stress:   . Feeling of Stress :   Social Connections:   . Frequency of Communication with Friends and Family:   . Frequency of Social Gatherings with Friends and Family:   . Attends Religious Services:   . Active Member of Clubs or Organizations:   . Attends Archivist Meetings:   Marland Kitchen Marital Status:   Intimate Partner Violence:   . Fear of Current or Ex-Partner:   . Emotionally Abused:   Marland Kitchen Physically Abused:   . Sexually Abused:    Review of Systems May have had fever last night--felt warm Confused this morning  No cough or SOB    Objective:   Physical Exam  Constitutional: He appears well-developed. No distress.  Neck: No thyromegaly present.  Cardiovascular: Normal rate, regular rhythm and normal heart sounds. Exam reveals no gallop.  No murmur heard. Respiratory: Effort normal and breath sounds normal. No respiratory distress. He has no wheezes. He has no rales.  GI: Soft. He exhibits no distension. There is no abdominal tenderness. There is no rebound and no guarding.  Musculoskeletal:     Comments: No CVA tenderness (pain is lower--near posterior hip)  Lymphadenopathy:    He has no cervical adenopathy.  Neurological:  Thought April but 2021 Starleen Blue" [id number] or 4 D-o-r-w Recall 2/3           Assessment & Plan:

## 2019-12-09 NOTE — Addendum Note (Signed)
Addended by: Randall An on: 12/09/2019 01:18 PM   Modules accepted: Orders

## 2019-12-09 NOTE — Assessment & Plan Note (Signed)
He is unaware but wife is very concerned She also notes he flares in anger at times  Could be early AD Will check some labs for now Reevaluation in a couple of months to review

## 2019-12-09 NOTE — Progress Notes (Signed)
Per orders of Dr. Silvio Pate, injection of ceftiaxone, 1mg , given by Randall An. Patient tolerated injection well.

## 2019-12-11 ENCOUNTER — Encounter: Payer: Self-pay | Admitting: Emergency Medicine

## 2019-12-11 ENCOUNTER — Other Ambulatory Visit: Payer: Self-pay

## 2019-12-11 ENCOUNTER — Inpatient Hospital Stay
Admission: EM | Admit: 2019-12-11 | Discharge: 2019-12-13 | DRG: 660 | Disposition: A | Discharge status: Home / Self Care | Payer: Medicare Other | Attending: Internal Medicine | Admitting: Internal Medicine

## 2019-12-11 ENCOUNTER — Observation Stay: Payer: Medicare Other

## 2019-12-11 DIAGNOSIS — N138 Other obstructive and reflux uropathy: Secondary | ICD-10-CM | POA: Diagnosis not present

## 2019-12-11 DIAGNOSIS — I10 Essential (primary) hypertension: Secondary | ICD-10-CM | POA: Diagnosis not present

## 2019-12-11 DIAGNOSIS — I714 Abdominal aortic aneurysm, without rupture: Secondary | ICD-10-CM | POA: Diagnosis present

## 2019-12-11 DIAGNOSIS — N1831 Chronic kidney disease, stage 3a: Secondary | ICD-10-CM | POA: Diagnosis present

## 2019-12-11 DIAGNOSIS — Z87440 Personal history of urinary (tract) infections: Secondary | ICD-10-CM

## 2019-12-11 DIAGNOSIS — Z823 Family history of stroke: Secondary | ICD-10-CM

## 2019-12-11 DIAGNOSIS — I4891 Unspecified atrial fibrillation: Secondary | ICD-10-CM | POA: Diagnosis present

## 2019-12-11 DIAGNOSIS — B952 Enterococcus as the cause of diseases classified elsewhere: Secondary | ICD-10-CM | POA: Diagnosis not present

## 2019-12-11 DIAGNOSIS — N200 Calculus of kidney: Secondary | ICD-10-CM | POA: Diagnosis present

## 2019-12-11 DIAGNOSIS — Z79899 Other long term (current) drug therapy: Secondary | ICD-10-CM

## 2019-12-11 DIAGNOSIS — Z833 Family history of diabetes mellitus: Secondary | ICD-10-CM

## 2019-12-11 DIAGNOSIS — N10 Acute tubulo-interstitial nephritis: Secondary | ICD-10-CM | POA: Diagnosis not present

## 2019-12-11 DIAGNOSIS — N401 Enlarged prostate with lower urinary tract symptoms: Secondary | ICD-10-CM | POA: Diagnosis not present

## 2019-12-11 DIAGNOSIS — E785 Hyperlipidemia, unspecified: Secondary | ICD-10-CM | POA: Diagnosis not present

## 2019-12-11 DIAGNOSIS — Z7982 Long term (current) use of aspirin: Secondary | ICD-10-CM

## 2019-12-11 DIAGNOSIS — Z8249 Family history of ischemic heart disease and other diseases of the circulatory system: Secondary | ICD-10-CM

## 2019-12-11 DIAGNOSIS — F17220 Nicotine dependence, chewing tobacco, uncomplicated: Secondary | ICD-10-CM | POA: Diagnosis present

## 2019-12-11 DIAGNOSIS — I2510 Atherosclerotic heart disease of native coronary artery without angina pectoris: Secondary | ICD-10-CM | POA: Diagnosis present

## 2019-12-11 DIAGNOSIS — K219 Gastro-esophageal reflux disease without esophagitis: Secondary | ICD-10-CM | POA: Diagnosis present

## 2019-12-11 DIAGNOSIS — N12 Tubulo-interstitial nephritis, not specified as acute or chronic: Secondary | ICD-10-CM | POA: Diagnosis present

## 2019-12-11 DIAGNOSIS — N179 Acute kidney failure, unspecified: Secondary | ICD-10-CM | POA: Diagnosis not present

## 2019-12-11 DIAGNOSIS — I129 Hypertensive chronic kidney disease with stage 1 through stage 4 chronic kidney disease, or unspecified chronic kidney disease: Secondary | ICD-10-CM | POA: Diagnosis present

## 2019-12-11 DIAGNOSIS — Z800 Family history of malignant neoplasm of digestive organs: Secondary | ICD-10-CM

## 2019-12-11 DIAGNOSIS — I25119 Atherosclerotic heart disease of native coronary artery with unspecified angina pectoris: Secondary | ICD-10-CM | POA: Diagnosis present

## 2019-12-11 DIAGNOSIS — J439 Emphysema, unspecified: Secondary | ICD-10-CM | POA: Diagnosis present

## 2019-12-11 DIAGNOSIS — Z20822 Contact with and (suspected) exposure to covid-19: Secondary | ICD-10-CM | POA: Diagnosis present

## 2019-12-11 DIAGNOSIS — I4581 Long QT syndrome: Secondary | ICD-10-CM | POA: Diagnosis not present

## 2019-12-11 DIAGNOSIS — N132 Hydronephrosis with renal and ureteral calculous obstruction: Secondary | ICD-10-CM | POA: Diagnosis not present

## 2019-12-11 DIAGNOSIS — I480 Paroxysmal atrial fibrillation: Secondary | ICD-10-CM | POA: Diagnosis present

## 2019-12-11 DIAGNOSIS — Z882 Allergy status to sulfonamides status: Secondary | ICD-10-CM

## 2019-12-11 LAB — COMPREHENSIVE METABOLIC PANEL
ALT: 14 U/L (ref 0–44)
AST: 24 U/L (ref 15–41)
Albumin: 3.7 g/dL (ref 3.5–5.0)
Alkaline Phosphatase: 53 U/L (ref 38–126)
Anion gap: 8 (ref 5–15)
BUN: 30 mg/dL — ABNORMAL HIGH (ref 8–23)
CO2: 26 mmol/L (ref 22–32)
Calcium: 9.1 mg/dL (ref 8.9–10.3)
Chloride: 102 mmol/L (ref 98–111)
Creatinine, Ser: 1.98 mg/dL — ABNORMAL HIGH (ref 0.61–1.24)
GFR calc Af Amer: 36 mL/min — ABNORMAL LOW (ref >60)
GFR calc non Af Amer: 31 mL/min — ABNORMAL LOW (ref >60)
Glucose, Bld: 133 mg/dL — ABNORMAL HIGH (ref 70–99)
Potassium: 4.2 mmol/L (ref 3.5–5.1)
Sodium: 136 mmol/L (ref 135–145)
Total Bilirubin: 1 mg/dL (ref 0.3–1.2)
Total Protein: 6.8 g/dL (ref 6.5–8.1)

## 2019-12-11 LAB — URINALYSIS, COMPLETE (UACMP) WITH MICROSCOPIC
Bacteria, UA: NONE SEEN
Bilirubin Urine: NEGATIVE
Glucose, UA: NEGATIVE mg/dL
Ketones, ur: NEGATIVE mg/dL
Nitrite: NEGATIVE
Protein, ur: NEGATIVE mg/dL
Specific Gravity, Urine: 1.014 (ref 1.005–1.030)
pH: 5 (ref 5.0–8.0)

## 2019-12-11 LAB — CBC WITH DIFFERENTIAL/PLATELET
Abs Immature Granulocytes: 0.03 10*3/uL (ref 0.00–0.07)
Basophils Absolute: 0 10*3/uL (ref 0.0–0.1)
Basophils Relative: 0 %
Eosinophils Absolute: 0.1 10*3/uL (ref 0.0–0.5)
Eosinophils Relative: 1 %
HCT: 46.5 % (ref 39.0–52.0)
Hemoglobin: 15.7 g/dL (ref 13.0–17.0)
Immature Granulocytes: 1 %
Lymphocytes Relative: 13 %
Lymphs Abs: 0.8 10*3/uL (ref 0.7–4.0)
MCH: 30.4 pg (ref 26.0–34.0)
MCHC: 33.8 g/dL (ref 30.0–36.0)
MCV: 89.9 fL (ref 80.0–100.0)
Monocytes Absolute: 1 10*3/uL (ref 0.1–1.0)
Monocytes Relative: 16 %
Neutro Abs: 4.5 10*3/uL (ref 1.7–7.7)
Neutrophils Relative %: 69 %
Platelets: 165 10*3/uL (ref 150–400)
RBC: 5.17 MIL/uL (ref 4.22–5.81)
RDW: 12.9 % (ref 11.5–15.5)
WBC: 6.4 10*3/uL (ref 4.0–10.5)
nRBC: 0 % (ref 0.0–0.2)

## 2019-12-11 LAB — URINE CULTURE
MICRO NUMBER:: 10442378
SPECIMEN QUALITY:: ADEQUATE

## 2019-12-11 LAB — LACTIC ACID, PLASMA: Lactic Acid, Venous: 1.9 mmol/L (ref 0.5–1.9)

## 2019-12-11 LAB — PROTIME-INR
INR: 1.1 (ref 0.8–1.2)
Prothrombin Time: 13.8 seconds (ref 11.4–15.2)

## 2019-12-11 LAB — SARS CORONAVIRUS 2 (TAT 6-24 HRS): SARS Coronavirus 2: NEGATIVE

## 2019-12-11 MED ORDER — ACETAMINOPHEN 325 MG PO TABS: 650.0000 mg | ORAL_TABLET | Freq: Four times a day (QID) | ORAL | Status: DC | PRN

## 2019-12-11 MED ORDER — SODIUM CHLORIDE 0.9 % IV SOLN: INTRAVENOUS | Status: DC

## 2019-12-11 MED ORDER — HEPARIN SODIUM (PORCINE) 5000 UNIT/ML IJ SOLN
5000.0000 [IU] | Freq: Three times a day (TID) | INTRAMUSCULAR | Status: DC
  Administered 2019-12-11 – 2019-12-13 (×5): 5000 [IU] via SUBCUTANEOUS
  Filled 2019-12-11 (×5): qty 1

## 2019-12-11 MED ORDER — LACTATED RINGERS IV BOLUS
1000.0000 mL | Freq: Once | INTRAVENOUS | Status: AC
  Administered 2019-12-11: 1000 mL via INTRAVENOUS

## 2019-12-11 MED ORDER — ENSURE ENLIVE PO LIQD
237.0000 mL | Freq: Three times a day (TID) | ORAL | Status: DC
  Administered 2019-12-11 – 2019-12-13 (×4): 237 mL via ORAL

## 2019-12-11 MED ORDER — DILTIAZEM HCL ER COATED BEADS 120 MG PO CP24
240.0000 mg | ORAL_CAPSULE | Freq: Every day | ORAL | Status: DC
  Administered 2019-12-12 – 2019-12-13 (×2): 240 mg via ORAL
  Filled 2019-12-11 (×2): qty 2

## 2019-12-11 MED ORDER — ADULT MULTIVITAMIN W/MINERALS CH
1.0000 | ORAL_TABLET | Freq: Every day | ORAL | Status: DC
  Administered 2019-12-12 – 2019-12-13 (×2): 1 via ORAL
  Filled 2019-12-11 (×2): qty 1

## 2019-12-11 MED ORDER — METOPROLOL TARTRATE 25 MG PO TABS
25.0000 mg | ORAL_TABLET | Freq: Two times a day (BID) | ORAL | Status: DC
  Administered 2019-12-11 – 2019-12-13 (×4): 25 mg via ORAL
  Filled 2019-12-11 (×5): qty 1

## 2019-12-11 MED ORDER — HYDRALAZINE HCL 20 MG/ML IJ SOLN: 5.0000 mg | INTRAMUSCULAR | Status: DC | PRN

## 2019-12-11 MED ORDER — SODIUM CHLORIDE 0.9 % IV SOLN
2.0000 g | Freq: Three times a day (TID) | INTRAVENOUS | Status: DC
  Administered 2019-12-11 – 2019-12-13 (×5): 2 g via INTRAVENOUS
  Filled 2019-12-11: qty 2
  Filled 2019-12-11: qty 2000
  Filled 2019-12-11 (×4): qty 2
  Filled 2019-12-11: qty 2000

## 2019-12-11 MED ORDER — SODIUM CHLORIDE 0.9 % IV SOLN
3.0000 g | Freq: Once | INTRAVENOUS | Status: AC
  Administered 2019-12-11: 3 g via INTRAVENOUS
  Filled 2019-12-11: qty 8

## 2019-12-11 MED ORDER — ONDANSETRON HCL 4 MG/2ML IJ SOLN: 4.0000 mg | Freq: Three times a day (TID) | INTRAMUSCULAR | Status: DC | PRN

## 2019-12-11 MED ORDER — OXYCODONE-ACETAMINOPHEN 5-325 MG PO TABS: 1.0000 | ORAL_TABLET | ORAL | Status: DC | PRN

## 2019-12-11 MED ORDER — TAMSULOSIN HCL 0.4 MG PO CAPS
0.4000 mg | ORAL_CAPSULE | Freq: Every day | ORAL | Status: DC
  Administered 2019-12-12 – 2019-12-13 (×2): 0.4 mg via ORAL
  Filled 2019-12-11 (×2): qty 1

## 2019-12-11 MED ORDER — ASPIRIN EC 81 MG PO TBEC
81.0000 mg | DELAYED_RELEASE_TABLET | Freq: Every day | ORAL | Status: DC
  Administered 2019-12-12 – 2019-12-13 (×2): 81 mg via ORAL
  Filled 2019-12-11 (×2): qty 1

## 2019-12-11 MED ORDER — ALBUTEROL SULFATE (2.5 MG/3ML) 0.083% IN NEBU: 2.5000 mg | INHALATION_SOLUTION | RESPIRATORY_TRACT | Status: DC | PRN

## 2019-12-11 MED ORDER — ROSUVASTATIN CALCIUM 10 MG PO TABS
20.0000 mg | ORAL_TABLET | Freq: Every day | ORAL | Status: DC
  Administered 2019-12-12 – 2019-12-13 (×2): 20 mg via ORAL
  Filled 2019-12-11 (×2): qty 2

## 2019-12-11 NOTE — ED Notes (Signed)
Attempted to call for pt's room assignment. Charge RN to call this RN back.

## 2019-12-11 NOTE — ED Triage Notes (Signed)
Pt started with left flank pain few days ago and saw PCP Wednesday.  They gave him shot abx then just in case had UTI starting back because has been admitted for same in past.  Temp 101 per wife yesterday.  No fever today.  On oral abx currently.  Still having mild flank pain.  No vomiting.

## 2019-12-11 NOTE — ED Notes (Signed)
Messaged ADmit MD Niu regarding orders for Blood cultures.  Informed him that the antibiotics had already been going and if he still wanted them collected. Per Admit Niu still collect them.

## 2019-12-11 NOTE — H&P (Signed)
History and Physical    Timothy Eaton [id number] DOB: [birth year] DOA: 12/11/2019  Referring MD/NP/PA:   PCP: [name], MD   Patient coming from:  The patient is coming from home.  At baseline, pt is independent for most of ADL.        Chief Complaint: left flank pain  HPI: Timothy Eaton is a 79 y.o. male with medical history significant of hypertension, hyperlipidemia, COPD, CAD, atrial fibrillation not on anticoagulants, AAA, BPH, CKD stage III, GERD, who presents with left flank pain.  Patient states that he has been having left flank pain for 4 days.  He has some nausea and occasional vomiting.  He also has chills and fever of 101 at one time at home. He has not had any fever since yesterday  Patient was seen by PCP 2 days ago, and diagnosed with UTI, given single dose of IM Rocephin and prescribed cefuroxime. Patient has continues to have pain in his left flank, which is constant, sharp, nonradiating moderate. Urine culture is positive culture for Enterococcus (sensitive to Ampicillin) per pharmacist.  Patient does not have chest pain, shortness breath, cough.  No abdominal pain or diarrhea.  Patient denies symptoms of UTI.  No hematuria.  ED Course: pt was found to have WBC 6.4, urinalysis (clear appearance, moderate amount of leukocyte, no bacteria, WBC 21-50), pending COVID-19 PCR, worsening renal function, lactic acid of 1.9, temperature normal, blood pressure 131/78, heart rate 61, RR 18, oxygen saturation 96% on room air.  Patient is placed on MedSurg bed for observation. Urology, dr. Gloriann Loan is consulted.  # CT-renal stone study: 1. 7 mm calculus in the proximal third of the left ureter just distal to the left ureteropelvic junction with mild to moderate proximal left hydroureteronephrosis indicative of obstruction. 2. Multiple additional nonobstructive calculi are noted within the collecting systems of both kidneys measuring up to 4 mm in the interpolar collecting system of  the right kidney. 3. Numerous low, intermediate and high attenuation lesions in the kidneys bilaterally, incompletely characterized on today's noncontrast CT examination. These are statistically likely to represent cysts and proteinaceous/hemorrhagic cysts, and could be definitively characterized with nonemergent MRI of the abdomen with and without IV gadolinium if of clinical concern. 4. Severe colonic diverticulosis. 5. Aortic atherosclerosis, in addition to least 2 vessel coronary artery disease. 6. Moderate to severe emphysema. 7. Additional incidental findings, as above.  Review of Systems:   General: had fevers, chills, no body weight gain, has fatigue HEENT: no blurry vision, hearing changes or sore throat Respiratory: no dyspnea, coughing, wheezing CV: no chest pain, no palpitations GI: has nausea, vomiting, no abdominal pain, diarrhea, constipation GU: no dysuria, burning on urination, increased urinary frequency, hematuria. Has left flank pain. Ext: no leg edema Neuro: no unilateral weakness, numbness, or tingling, no vision change or hearing loss Skin: no rash, no skin tear. MSK: No muscle spasm, no deformity, no limitation of range of movement in spin Heme: No easy bruising.  Travel history: No recent long distant travel.  Allergy:  Allergies  Allergen Reactions  . Sulfamethoxazole-Trimethoprim     REACTION: rash    Past Medical History:  Diagnosis Date  . AAA (abdominal aortic aneurysm) ([place])    [name], Repaired, 2005  . Atrial fibrillation (HCC)    Postoperative in past  . Carotid artery disease ([place])    Minimal, doppler 2004, 0-39% bilateral, doppler 05/17/09 0-39% bilateral  . COPD (chronic obstructive pulmonary disease) ([place])   . Coronary  artery disease    Moderate, catheterization 2005, nuclear scan 9/09 no ischemia, EF 60%, echo  . ED (erectile dysfunction)   . Ejection fraction    EF 60% echo, September, 2009  . GERD (gastroesophageal reflux disease)     . Hemorrhoids   . Hyperlipidemia   . Hypertension   . Lung nodule    Followup by pulmonary in past  . Palpitations    History palpitations in the past  . Palpitations    January, 2013  . Shortness of breath     Past Surgical History:  Procedure Laterality Date  . ABDOMINAL AORTIC ANEURYSM REPAIR  04/06/04   Dr. [name]  . BACK SURGERY    . CHOLECYSTECTOMY  11/26/02  . PILONIDAL CYST EXCISION  11/11    Social History:  reports that he quit smoking about 17 years ago. His smokeless tobacco use includes chew. He reports that he does not drink alcohol or use drugs.  Family History:  Family History  Problem Relation Age of Onset  . Hypertension Mother        ?  . Stroke Mother        CVA  . Diabetes Mother        DM  . Heart attack Father        MI  . Cancer Sister        Gall bladder   . Stroke Other        All mom's family died of strokes     Prior to Admission medications   Medication Sig Start Date End Date Taking? Authorizing Provider  aspirin 81 MG EC tablet Take 81 mg by mouth daily.     Yes [provider]  cefUROXime (CEFTIN) 500 MG tablet Take 1 tablet (500 mg total) by mouth 2 (two) times daily with a meal. 12/09/19  Yes [name], MD  diltiazem (CARDIZEM CD) 240 MG 24 hr capsule TAKE 1 CAPSULE BY MOUTH EVERY DAY Patient taking differently: Take 240 mg by mouth daily.  04/14/19  Yes Nahser, Wonda Cheng, MD  metoprolol tartrate (LOPRESSOR) 25 MG tablet TAKE 1 TABLET BY MOUTH TWICE DAILY. PLEASE KEEP APPOINTMENT IN JULY WITH DOCTOR NAHSER BFOR FUTURE REFILLS Patient taking differently: Take 25 mg by mouth 2 (two) times daily.  04/14/19  Yes Nahser, Wonda Cheng, MD  rosuvastatin (CRESTOR) 20 MG tablet TAKE 1 TABLET BY MOUTH ONCE DAILY Patient taking differently: Take 20 mg by mouth daily.  04/29/19  Yes Nahser, Wonda Cheng, MD  tamsulosin (FLOMAX) 0.4 MG CAPS capsule Take 1 capsule (0.4 mg total) by mouth daily. 07/07/19  Yes [name], MD    Physical  Exam: Vitals:   12/11/19 1010 12/11/19 1014 12/11/19 1200 12/11/19 1230  BP:  129/70 129/82 131/78  Pulse:  60 61 61  Resp:  18    Temp:  97.8 F (36.6 C)    TempSrc:  Oral    SpO2:  97% 96% 96%  Weight: 73.9 kg     Height: 5\' 9"  (1.753 m)      General: Not in acute distress HEENT:       Eyes: PERRL, EOMI, no scleral icterus.       ENT: No discharge from the ears and nose, no pharynx injection, no tonsillar enlargement.        Neck: No JVD, no bruit, no mass felt. Heme: No neck lymph node enlargement. Cardiac: S1/S2, RRR, No murmurs, No gallops or rubs. Respiratory: No rales, wheezing,  rhonchi or rubs. GI: Soft, nondistended, nontender, no rebound pain, no organomegaly, BS present. GU: No hematuria. Has left CVA tenderness. Ext: No pitting leg edema bilaterally. 2+DP/PT pulse bilaterally. Musculoskeletal: No joint deformities, No joint redness or warmth, no limitation of ROM in spin. Skin: No rashes.  Neuro: Alert, oriented X3, cranial nerves II-XII grossly intact, moves all extremities normally.  Psych: Patient is not psychotic, no suicidal or hemocidal ideation.  Labs on Admission: I have personally reviewed following labs and imaging studies  CBC: Recent Labs  Lab 12/09/19 1314 12/11/19 1002  WBC 9.8 6.4  NEUTROABS  --  4.5  HGB 15.6 15.7  HCT 46.5 46.5  MCV 92.3 89.9  PLT 202.0 [id number]   Basic Metabolic Panel: Recent Labs  Lab 12/09/19 1314 12/11/19 1002  NA 140 136  K 4.1 4.2  CL 104 102  CO2 30 26  GLUCOSE 119* 133*  BUN 28* 30*  CREATININE 2.13* 1.98*  CALCIUM 9.2 9.1   GFR: Estimated Creatinine Clearance: 29.8 mL/min (A) (by C-G formula based on SCr of 1.98 mg/dL (H)). Liver Function Tests: Recent Labs  Lab 12/09/19 1314 12/11/19 1002  AST 14 24  ALT 9 14  ALKPHOS 73 53  BILITOT 1.0 1.0  PROT 6.2 6.8  ALBUMIN 3.9 3.7   No results for input(s): LIPASE, AMYLASE in the last 168 hours. No results for input(s): AMMONIA in the last 168  hours. Coagulation Profile: No results for input(s): INR, PROTIME in the last 168 hours. Cardiac Enzymes: No results for input(s): CKTOTAL, CKMB, CKMBINDEX, TROPONINI in the last 168 hours. BNP (last 3 results) No results for input(s): PROBNP in the last 8760 hours. HbA1C: No results for input(s): HGBA1C in the last 72 hours. CBG: No results for input(s): GLUCAP in the last 168 hours. Lipid Profile: No results for input(s): CHOL, HDL, LDLCALC, TRIG, CHOLHDL, LDLDIRECT in the last 72 hours. Thyroid Function Tests: Recent Labs    12/09/19 1314  FREET4 0.97   Anemia Panel: Recent Labs    12/09/19 1314  VITAMINB12 171*   Urine analysis:    Component Value Date/Time   COLORURINE YELLOW (A) 12/11/2019 1002   APPEARANCEUR CLEAR (A) 12/11/2019 1002   LABSPEC 1.014 12/11/2019 1002   PHURINE 5.0 12/11/2019 1002   GLUCOSEU NEGATIVE 12/11/2019 1002   HGBUR LARGE (A) 12/11/2019 1002   BILIRUBINUR NEGATIVE 12/11/2019 1002   BILIRUBINUR 1+ 12/09/2019 1416   KETONESUR NEGATIVE 12/11/2019 1002   PROTEINUR NEGATIVE 12/11/2019 1002   UROBILINOGEN 1.0 12/09/2019 1416   NITRITE NEGATIVE 12/11/2019 1002   LEUKOCYTESUR MODERATE (A) 12/11/2019 1002   Sepsis Labs: @LABRCNTIP (procalcitonin:4,lacticidven:4) ) Recent Results (from the past 240 hour(s))  Urine Culture     Status: Abnormal   Collection Time: 12/09/19  2:17 PM   Specimen: Urine  Result Value Ref Range Status   MICRO NUMBER: [id number]  Final   SPECIMEN QUALITY: Adequate  Final   Sample Source NOT GIVEN  Final   STATUS: FINAL  Final   ISOLATE 1: Enterococcus faecalis (A)  Final    Comment: Greater than 100,000 CFU/mL of Enterococcus faecalis      Susceptibility   Enterococcus faecalis - URINE CULTURE POSITIVE 1    AMPICILLIN <=2 Sensitive     VANCOMYCIN 2 Sensitive     NITROFURANTOIN* <=16 Sensitive      * Legend:S = Susceptible  I = IntermediateR = Resistant  NS = Not susceptible* = Not tested  NR = Not reported**NN = See  antimicrobic  comments     Radiological Exams on Admission: No results found.   EKG:  Not done in ED, will get one.   Assessment/Plan Principal Problem:   Acute pyelonephritis Active Problems:   Hyperlipidemia   Essential hypertension   COPD (chronic obstructive pulmonary disease) with emphysema (HCC)   Coronary artery disease   Atrial fibrillation (HCC)   BPH with obstruction/lower urinary tract symptoms   Acute renal failure superimposed on stage 3a chronic kidney disease (HCC)   Acute pyelonephritis: CT showed 7 mm calculus in the proximal third of the left ureter just distal to the left ureteropelvic junction with mild to moderate proximal left hydroureteronephrosis indicative of obstruction.  Urologist, Dr. Gloriann Loan was consulted.  Patient currently is not septic, no fever or leukocytosis.  Hemodynamically stable.  Dr. Gloriann Loan will do surgery tomorrow.  -Place on med-surg bed for obs -IV ampicillin as recommended by pharmacist -Follow-up blood culture and urine culture -IV fluid: 1 L LR, followed by 75 cc/h of NS -prn percocet for pain -NPO after MN  Hyperlipidemia -Crestor  HTN:  -Continue home medications: Metoprolol, Cardizem -hydralazine prn  COPD (chronic obstructive pulmonary disease) with emphysema ([place]): -Bronchodilators  Coronary artery disease: No chest pain -Aspirin, Crestor  Atrial fibrillation ([place]): Not on anticoagulants -Metoprolol and Cardizem  BPH with obstruction/lower urinary tract symptoms -flomax  Acute renal failure superimposed on stage 3a chronic kidney disease ([place]): likely due to obstruction and low nephritis -IV fluid as above -Follow-up by BMP       DVT ppx: SQ Lovenox Code Status: Full code Family Communication: not done, no family member is at bed side.    Disposition Plan:  Anticipate discharge back to previous home environment Consults called:  none Admission status: Med-surg bed for obs      Status is:  Observation  The patient remains OBS appropriate and will d/c before 2 midnights.  Dispo: The patient is from: Home              Anticipated d/c is to: Home              Anticipated d/c date is: 1 day              Patient currently is not medically stable to d/c.           Date of Service 12/11/2019    Ivor Costa [facility]   If 7PM-7AM, please contact night-coverage [email or web address] 12/11/2019, 12:45 PM

## 2019-12-11 NOTE — ED Notes (Signed)
Pt given meal tray.

## 2019-12-11 NOTE — ED Notes (Signed)
Called Marlowe Kays wife of pt and updated her on pts room assignment.

## 2019-12-11 NOTE — Consult Note (Signed)
Pharmacy Antibiotic Note  Timothy Eaton is a 79 y.o. male admitted on 12/11/2019 with Pyelonephritis, positive culture for Enterococcus (sensitive to Ampicillin).  Pharmacy has been consulted for Ampicillin dosing. Patient presented with diagnosis of UTI from PCP. He was seen by his PCP 2 days ago and diagnosed with UTI, he was given a single dose of IM Rocephin and prescribed cefuroxime.  Patient developed worsening symptoms while on cefuroxime.  Plan: Ampicillin 2g Q8H.   Height: 5\' 9"  (175.3 cm) Weight: 73.9 kg (163 lb) IBW/kg (Calculated) : 70.7  Temp (24hrs), Avg:97.8 F (36.6 C), Min:97.8 F (36.6 C), Max:97.8 F (36.6 C)  Recent Labs  Lab 12/09/19 1314 12/11/19 1002  WBC 9.8 6.4  CREATININE 2.13* 1.98*  LATICACIDVEN  --  1.9    Estimated Creatinine Clearance: 29.8 mL/min (A) (by C-G formula based on SCr of 1.98 mg/dL (H)).    Allergies  Allergen Reactions  . Sulfamethoxazole-Trimethoprim     REACTION: rash      Thank you for allowing pharmacy to be a part of this patient's care.  Rowland Lathe 12/11/2019 1:04 PM

## 2019-12-11 NOTE — Anesthesia Preprocedure Evaluation (Addendum)
Anesthesia Evaluation  Patient identified by MRN, date of birth, ID band Patient awake    Reviewed: Allergy & Precautions, NPO status , Patient's Chart, lab work & pertinent test results  History of Anesthesia Complications Negative for: history of anesthetic complications  Airway Mallampati: III  TM Distance: >3 FB Neck ROM: Full    Dental  (+) Edentulous Lower, Edentulous Upper   Pulmonary shortness of breath and with exertion, neg sleep apnea, COPD, Patient abstained from smoking.Not current smoker, former smoker,  No inhalers at home; thinks he has COPD but seems not too affected by it. Gets some SOB with exertion   Pulmonary exam normal breath sounds clear to auscultation       Cardiovascular Exercise Tolerance: Good METShypertension, + CAD  (-) Past MI + dysrhythmias Atrial Fibrillation  Rhythm:Regular Rate:Normal - Systolic murmurs    Neuro/Psych negative neurological ROS  negative psych ROS   GI/Hepatic GERD  Controlled,(+)     (-) substance abuse  ,   Endo/Other  neg diabetes  Renal/GU Renal disease     Musculoskeletal   Abdominal   Peds  Hematology   Anesthesia Other Findings Past Medical History: No date: AAA (abdominal aortic aneurysm) ([place])     Comment:  [place], 2005 No date: Atrial fibrillation ([place])     Comment:  Postoperative in past No date: Carotid artery disease ([place])     Comment:  Minimal, doppler 2004, 0-39% bilateral, doppler 05/17/09              0-39% bilateral No date: COPD (chronic obstructive pulmonary disease) (HCC) No date: Coronary artery disease     Comment:  Moderate, catheterization 2005, nuclear scan 9/09 no               ischemia, EF 60%, echo No date: ED (erectile dysfunction) No date: Ejection fraction     Comment:  EF 60% echo, September, 2009 No date: GERD (gastroesophageal reflux disease) No date: Hemorrhoids No date: Hyperlipidemia No date:  Hypertension No date: Lung nodule     Comment:  Followup by pulmonary in past No date: Palpitations     Comment:  History palpitations in the past No date: Palpitations     Comment:  January, 2013 No date: Shortness of breath   Reproductive/Obstetrics                            Anesthesia Physical Anesthesia Plan  ASA: III  Anesthesia Plan: General   Post-op Pain Management:    Induction: Intravenous  PONV Risk Score and Plan: 3 and Ondansetron, Dexamethasone, Propofol infusion and TIVA  Airway Management Planned: Natural Airway and LMA  Additional Equipment: None  Intra-op Plan:   Post-operative Plan: Extubation in OR  Informed Consent: I have reviewed the patients History and Physical, chart, labs and discussed the procedure including the risks, benefits and alternatives for the proposed anesthesia with the patient or authorized representative who has indicated his/her understanding and acceptance.     Dental advisory given  Plan Discussed with: CRNA and Surgeon  Anesthesia Plan Comments: (Denies Nausea or vomiting, has been NPO since 4pm yesterday. Discussed natural airways vs LMA, will plan for natural airway if tolerated. Discussed risks of anesthesia with patient, including PONV, sore throat, lip/dental damage. Rare risks discussed as well, such as cardiorespiratory and neurological sequelae. Patient understands.)        Anesthesia Quick Evaluation

## 2019-12-11 NOTE — ED Notes (Signed)
Pt given urinal.

## 2019-12-11 NOTE — ED Provider Notes (Signed)
[facility] Emergency Department Provider Note   ____________________________________________   First MD Initiated Contact with Patient 12/11/19 1148     (approximate)  I have reviewed the triage vital signs and the nursing notes.   HISTORY  Chief Complaint Flank Pain    HPI Timothy Eaton is a 79 y.o. male with past medical history of hypertension, hyperlipidemia, CAD, COPD who presents to the ED complaining of flank pain.  Patient reportedly developed left flank pain 3 to 4 days ago along with fever as high as 101 and occasional nausea with vomiting.  He was seen by his PCP 2 days ago and diagnosed with UTI, given single dose of IM Rocephin and prescribed cefuroxime.  Patient has continued to have pain in his left flank since then and wife was concerned that he was not getting better, so she brought him to the ED.  He has not had any fever since yesterday and is also not had any vomiting throughout the day today.  He denies significant pain at this time.        Past Medical History:  Diagnosis Date  . AAA (abdominal aortic aneurysm) ([place])    [name], Repaired, 2005  . Atrial fibrillation (HCC)    Postoperative in past  . Carotid artery disease ([place])    Minimal, doppler 2004, 0-39% bilateral, doppler 05/17/09 0-39% bilateral  . COPD (chronic obstructive pulmonary disease) ([place])   . Coronary artery disease    Moderate, catheterization 2005, nuclear scan 9/09 no ischemia, EF 60%, echo  . ED (erectile dysfunction)   . Ejection fraction    EF 60% echo, September, 2009  . GERD (gastroesophageal reflux disease)   . Hemorrhoids   . Hyperlipidemia   . Hypertension   . Lung nodule    Followup by pulmonary in past  . Palpitations    History palpitations in the past  . Palpitations    January, 2013  . Shortness of breath     Patient Active Problem List   Diagnosis Date Noted  . Acute pyelonephritis 12/11/2019  . Acute renal failure superimposed on  stage 3a chronic kidney disease ([place]) 12/11/2019  . Memory loss 12/09/2019  . BPH with obstruction/lower urinary tract symptoms 07/07/2019  . UTI (urinary tract infection) 06/30/2019  . Elevated troponin 06/30/2019  . Preventative health care 04/09/2017  . Advance directive discussed with patient 04/09/2017  . COPD exacerbation ([place]) 07/29/2015  . Coronary artery disease   . Carotid artery disease ([place])   . Atrial fibrillation ([place])   . Pulmonary nodules 04/02/2011  . COPD (chronic obstructive pulmonary disease) with emphysema ([place]) 04/02/2009  . HEMORRHOIDS, HX OF 04/02/2009  . Hyperlipidemia 06/25/2008  . Essential hypertension 06/25/2008    Past Surgical History:  Procedure Laterality Date  . ABDOMINAL AORTIC ANEURYSM REPAIR  04/06/04   Dr. [name]  . BACK SURGERY    . CHOLECYSTECTOMY  11/26/02  . PILONIDAL CYST EXCISION  11/11    Prior to Admission medications   Medication Sig Start Date End Date Taking? Authorizing Provider  aspirin 81 MG EC tablet Take 81 mg by mouth daily.     Yes [provider]  cefUROXime (CEFTIN) 500 MG tablet Take 1 tablet (500 mg total) by mouth 2 (two) times daily with a meal. 12/09/19  Yes [name], MD  diltiazem (CARDIZEM CD) 240 MG 24 hr capsule TAKE 1 CAPSULE BY MOUTH EVERY DAY Patient taking differently: Take 240 mg by mouth daily.  04/14/19  Yes Nahser, Wonda Cheng, MD  metoprolol tartrate (LOPRESSOR) 25 MG tablet TAKE 1 TABLET BY MOUTH TWICE DAILY. PLEASE KEEP APPOINTMENT IN JULY WITH DOCTOR NAHSER BFOR FUTURE REFILLS Patient taking differently: Take 25 mg by mouth 2 (two) times daily.  04/14/19  Yes Nahser, Wonda Cheng, MD  rosuvastatin (CRESTOR) 20 MG tablet TAKE 1 TABLET BY MOUTH ONCE DAILY Patient taking differently: Take 20 mg by mouth daily.  04/29/19  Yes Nahser, Wonda Cheng, MD  tamsulosin (FLOMAX) 0.4 MG CAPS capsule Take 1 capsule (0.4 mg total) by mouth daily. 07/07/19  Yes [name], MD     Allergies Sulfamethoxazole-trimethoprim  Family History  Problem Relation Age of Onset  . Hypertension Mother        ?  . Stroke Mother        CVA  . Diabetes Mother        DM  . Heart attack Father        MI  . Cancer Sister        Gall bladder   . Stroke Other        All mom's family died of strokes    Social History Social History   Tobacco Use  . Smoking status: Former Smoker    Quit date: 08/06/2002    Years since quitting: 17.3  . Smokeless tobacco: Current User    Types: Chew  Substance Use Topics  . Alcohol use: No  . Drug use: No    Review of Systems  Constitutional: Positive for fever/chills Eyes: No visual changes. ENT: No sore throat. Cardiovascular: Denies chest pain. Respiratory: Denies shortness of breath. Gastrointestinal: No abdominal pain.  Positive for flank pain.  Positive for nausea and vomiting.  No diarrhea.  No constipation. Genitourinary: Negative for dysuria. Musculoskeletal: Negative for back pain. Skin: Negative for rash. Neurological: Negative for headaches, focal weakness or numbness.  ____________________________________________   PHYSICAL EXAM:  VITAL SIGNS: ED Triage Vitals  Enc Vitals Group     BP 12/11/19 1014 129/70     Pulse Rate 12/11/19 1014 60     Resp 12/11/19 1014 18     Temp 12/11/19 1014 97.8 F (36.6 C)     Temp Source 12/11/19 1014 Oral     SpO2 12/11/19 1014 97 %     Weight 12/11/19 1010 163 lb (73.9 kg)     Height 12/11/19 1010 5\' 9"  (1.753 m)     Head Circumference --      Peak Flow --      Pain Score 12/11/19 1009 5     Pain Loc --      Pain Edu? --      Excl. in [place]? --     Constitutional: Alert and oriented. Eyes: Conjunctivae are normal. Head: Atraumatic. Nose: No congestion/rhinnorhea. Mouth/Throat: Mucous membranes are moist. Neck: Normal ROM Cardiovascular: Normal rate, regular rhythm. Grossly normal heart sounds. Respiratory: Normal respiratory effort.  No retractions. Lungs  CTAB. Gastrointestinal: Soft and nontender.  Left CVA tenderness.  No distention. Genitourinary: deferred Musculoskeletal: No lower extremity tenderness nor edema. Neurologic:  Normal speech and language. No gross focal neurologic deficits are appreciated. Skin:  Skin is warm, dry and intact. No rash noted. Psychiatric: Mood and affect are normal. Speech and behavior are normal.  ____________________________________________   LABS (all labs ordered are listed, but only abnormal results are displayed)  Labs Reviewed  COMPREHENSIVE METABOLIC PANEL - Abnormal; Notable for the following components:      Result Value  Glucose, Bld 133 (*)    BUN 30 (*)    Creatinine, Ser 1.98 (*)    GFR calc non Af Amer 31 (*)    GFR calc Af Amer 36 (*)    All other components within normal limits  URINALYSIS, COMPLETE (UACMP) WITH MICROSCOPIC - Abnormal; Notable for the following components:   Color, Urine YELLOW (*)    APPearance CLEAR (*)    Hgb urine dipstick LARGE (*)    Leukocytes,Ua MODERATE (*)    All other components within normal limits  URINE CULTURE  SARS CORONAVIRUS 2 (TAT 6-24 HRS)  LACTIC ACID, PLASMA  CBC WITH DIFFERENTIAL/PLATELET    PROCEDURES  Procedure(s) performed (including Critical Care):  Procedures   ____________________________________________   INITIAL IMPRESSION / ASSESSMENT AND PLAN / ED COURSE       79 year old male with history of hypertension, hyperlipidemia, COPD, and CAD presents to the ED with ongoing left flank pain after being diagnosed with with a UTI at his PCPs office.  Reviewing culture data, he has now grown Enterococcus faecalis which would not be sensitive to the cefuroxime previously prescribed.  I am concerned that he has developed pyelonephritis given his left CVA tenderness, also noted to have AKI on labs.  He does appear overall well with reassuring vital signs, not consistent with sepsis.  Following discussion with pharmacy, we will treat  with Unasyn but he would benefit from admission given AKI.  We will also hydrate with IV fluids, case discussed with hospitalist for admission.      ____________________________________________   FINAL CLINICAL IMPRESSION(S) / ED DIAGNOSES  Final diagnoses:  Pyelonephritis  AKI (acute kidney injury) [facility])     ED Discharge Orders    None       Note:  This document was prepared using Dragon voice recognition software and may include unintentional dictation errors.   Blake Divine, MD 12/11/19 1245

## 2019-12-12 ENCOUNTER — Observation Stay: Payer: Medicare Other

## 2019-12-12 ENCOUNTER — Observation Stay: Payer: Medicare Other | Admitting: Anesthesiology

## 2019-12-12 ENCOUNTER — Encounter
Admission: EM | Disposition: A | Discharge status: Home / Self Care | Payer: Self-pay | Source: Home / Self Care | Attending: Internal Medicine

## 2019-12-12 DIAGNOSIS — Z7982 Long term (current) use of aspirin: Secondary | ICD-10-CM | POA: Diagnosis not present

## 2019-12-12 DIAGNOSIS — I4891 Unspecified atrial fibrillation: Secondary | ICD-10-CM | POA: Diagnosis present

## 2019-12-12 DIAGNOSIS — N179 Acute kidney failure, unspecified: Secondary | ICD-10-CM | POA: Diagnosis present

## 2019-12-12 DIAGNOSIS — N401 Enlarged prostate with lower urinary tract symptoms: Secondary | ICD-10-CM | POA: Diagnosis present

## 2019-12-12 DIAGNOSIS — N390 Urinary tract infection, site not specified: Secondary | ICD-10-CM | POA: Diagnosis not present

## 2019-12-12 DIAGNOSIS — N1831 Chronic kidney disease, stage 3a: Secondary | ICD-10-CM | POA: Diagnosis present

## 2019-12-12 DIAGNOSIS — N10 Acute tubulo-interstitial nephritis: Principal | ICD-10-CM

## 2019-12-12 DIAGNOSIS — N138 Other obstructive and reflux uropathy: Secondary | ICD-10-CM | POA: Diagnosis present

## 2019-12-12 DIAGNOSIS — Z79899 Other long term (current) drug therapy: Secondary | ICD-10-CM | POA: Diagnosis not present

## 2019-12-12 DIAGNOSIS — N183 Chronic kidney disease, stage 3 unspecified: Secondary | ICD-10-CM | POA: Diagnosis not present

## 2019-12-12 DIAGNOSIS — N200 Calculus of kidney: Secondary | ICD-10-CM | POA: Diagnosis present

## 2019-12-12 DIAGNOSIS — N12 Tubulo-interstitial nephritis, not specified as acute or chronic: Secondary | ICD-10-CM | POA: Diagnosis present

## 2019-12-12 DIAGNOSIS — E785 Hyperlipidemia, unspecified: Secondary | ICD-10-CM | POA: Diagnosis present

## 2019-12-12 DIAGNOSIS — I129 Hypertensive chronic kidney disease with stage 1 through stage 4 chronic kidney disease, or unspecified chronic kidney disease: Secondary | ICD-10-CM | POA: Diagnosis present

## 2019-12-12 DIAGNOSIS — Z8249 Family history of ischemic heart disease and other diseases of the circulatory system: Secondary | ICD-10-CM | POA: Diagnosis not present

## 2019-12-12 DIAGNOSIS — I714 Abdominal aortic aneurysm, without rupture: Secondary | ICD-10-CM | POA: Diagnosis present

## 2019-12-12 DIAGNOSIS — Z87440 Personal history of urinary (tract) infections: Secondary | ICD-10-CM | POA: Diagnosis not present

## 2019-12-12 DIAGNOSIS — Z833 Family history of diabetes mellitus: Secondary | ICD-10-CM | POA: Diagnosis not present

## 2019-12-12 DIAGNOSIS — Z800 Family history of malignant neoplasm of digestive organs: Secondary | ICD-10-CM | POA: Diagnosis not present

## 2019-12-12 DIAGNOSIS — N201 Calculus of ureter: Secondary | ICD-10-CM | POA: Diagnosis not present

## 2019-12-12 DIAGNOSIS — Z823 Family history of stroke: Secondary | ICD-10-CM | POA: Diagnosis not present

## 2019-12-12 DIAGNOSIS — K219 Gastro-esophageal reflux disease without esophagitis: Secondary | ICD-10-CM | POA: Diagnosis present

## 2019-12-12 DIAGNOSIS — B952 Enterococcus as the cause of diseases classified elsewhere: Secondary | ICD-10-CM | POA: Diagnosis present

## 2019-12-12 DIAGNOSIS — J439 Emphysema, unspecified: Secondary | ICD-10-CM | POA: Diagnosis present

## 2019-12-12 DIAGNOSIS — Z20822 Contact with and (suspected) exposure to covid-19: Secondary | ICD-10-CM | POA: Diagnosis present

## 2019-12-12 DIAGNOSIS — F17220 Nicotine dependence, chewing tobacco, uncomplicated: Secondary | ICD-10-CM | POA: Diagnosis present

## 2019-12-12 DIAGNOSIS — I2510 Atherosclerotic heart disease of native coronary artery without angina pectoris: Secondary | ICD-10-CM | POA: Diagnosis present

## 2019-12-12 DIAGNOSIS — Z882 Allergy status to sulfonamides status: Secondary | ICD-10-CM | POA: Diagnosis not present

## 2019-12-12 HISTORY — PX: CYSTOSCOPY WITH STENT PLACEMENT: SHX5790

## 2019-12-12 LAB — BASIC METABOLIC PANEL
Anion gap: 6 (ref 5–15)
BUN: 29 mg/dL — ABNORMAL HIGH (ref 8–23)
CO2: 26 mmol/L (ref 22–32)
Calcium: 8.3 mg/dL — ABNORMAL LOW (ref 8.9–10.3)
Chloride: 106 mmol/L (ref 98–111)
Creatinine, Ser: 1.8 mg/dL — ABNORMAL HIGH (ref 0.61–1.24)
GFR calc Af Amer: 40 mL/min — ABNORMAL LOW (ref >60)
GFR calc non Af Amer: 35 mL/min — ABNORMAL LOW (ref >60)
Glucose, Bld: 90 mg/dL (ref 70–99)
Potassium: 4.2 mmol/L (ref 3.5–5.1)
Sodium: 138 mmol/L (ref 135–145)

## 2019-12-12 LAB — CBC
HCT: 39.1 % (ref 39.0–52.0)
Hemoglobin: 13.7 g/dL (ref 13.0–17.0)
MCH: 31.3 pg (ref 26.0–34.0)
MCHC: 35 g/dL (ref 30.0–36.0)
MCV: 89.3 fL (ref 80.0–100.0)
Platelets: 167 10*3/uL (ref 150–400)
RBC: 4.38 MIL/uL (ref 4.22–5.81)
RDW: 12.8 % (ref 11.5–15.5)
WBC: 4 10*3/uL (ref 4.0–10.5)
nRBC: 0 % (ref 0.0–0.2)

## 2019-12-12 LAB — URINE CULTURE: Culture: NO GROWTH

## 2019-12-12 SURGERY — CYSTOSCOPY, WITH STENT INSERTION
Anesthesia: General | Laterality: Left

## 2019-12-12 MED ORDER — PROPOFOL 10 MG/ML IV BOLUS
INTRAVENOUS | Status: DC | PRN
  Administered 2019-12-12: 40 mg via INTRAVENOUS

## 2019-12-12 MED ORDER — FENTANYL CITRATE (PF) 100 MCG/2ML IJ SOLN
INTRAMUSCULAR | Status: AC
  Filled 2019-12-12: qty 2

## 2019-12-12 MED ORDER — PROPOFOL 10 MG/ML IV BOLUS
INTRAVENOUS | Status: AC
  Filled 2019-12-12: qty 20

## 2019-12-12 MED ORDER — OXYCODONE HCL 5 MG/5ML PO SOLN: 5.0000 mg | Freq: Once | ORAL | Status: DC | PRN

## 2019-12-12 MED ORDER — IOPAMIDOL (ISOVUE-300) INJECTION 61%
INTRAVENOUS | Status: DC | PRN
  Administered 2019-12-12: 10:00:00 7 mL via URETHRAL

## 2019-12-12 MED ORDER — FENTANYL CITRATE (PF) 100 MCG/2ML IJ SOLN: 25.0000 ug | INTRAMUSCULAR | Status: DC | PRN

## 2019-12-12 MED ORDER — FENTANYL CITRATE (PF) 100 MCG/2ML IJ SOLN
INTRAMUSCULAR | Status: DC | PRN
  Administered 2019-12-12 (×2): 25 ug via INTRAVENOUS

## 2019-12-12 MED ORDER — ACETAMINOPHEN 10 MG/ML IV SOLN: 1000.0000 mg | Freq: Once | INTRAVENOUS | Status: DC | PRN

## 2019-12-12 MED ORDER — ONDANSETRON HCL 4 MG/2ML IJ SOLN: 4.0000 mg | Freq: Once | INTRAMUSCULAR | Status: DC | PRN

## 2019-12-12 MED ORDER — PROPOFOL 500 MG/50ML IV EMUL
INTRAVENOUS | Status: DC | PRN
  Administered 2019-12-12: 200 ug/kg/min via INTRAVENOUS

## 2019-12-12 MED ORDER — LIDOCAINE HCL (PF) 2 % IJ SOLN
INTRAMUSCULAR | Status: AC
  Filled 2019-12-12: qty 5

## 2019-12-12 MED ORDER — OXYCODONE HCL 5 MG PO TABS: 5.0000 mg | ORAL_TABLET | Freq: Once | ORAL | Status: DC | PRN

## 2019-12-12 MED ORDER — LIDOCAINE HCL (CARDIAC) PF 100 MG/5ML IV SOSY
PREFILLED_SYRINGE | INTRAVENOUS | Status: DC | PRN
  Administered 2019-12-12: 80 mg via INTRAVENOUS

## 2019-12-12 SURGICAL SUPPLY — 16 items
BAG DRAIN CYSTO-URO LG1000N (MISCELLANEOUS) ×6 IMPLANT
BRUSH SCRUB EZ  4% CHG (MISCELLANEOUS) ×3
BRUSH SCRUB EZ 4% CHG (MISCELLANEOUS) ×1 IMPLANT
CATH URETL 5X70 OPEN END (CATHETERS) ×3 IMPLANT
CONRAY 43 FOR UROLOGY 50M (MISCELLANEOUS) ×3 IMPLANT
DRAPE UTILITY 15X26 TOWEL STRL (DRAPES) ×3 IMPLANT
GOWN STRL REUS W/ TWL LRG LVL3 (GOWN DISPOSABLE) ×1 IMPLANT
GOWN STRL REUS W/TWL LRG LVL3 (GOWN DISPOSABLE) ×3
GUIDEWIRE STR DUAL SENSOR (WIRE) ×3 IMPLANT
KIT TURNOVER CYSTO (KITS) ×3 IMPLANT
PACK CYSTO AR (MISCELLANEOUS) ×3 IMPLANT
SET CYSTO W/LG BORE CLAMP LF (SET/KITS/TRAYS/PACK) ×3 IMPLANT
SOL .9 NS 3000ML IRR  AL (IV SOLUTION) ×3
SOL .9 NS 3000ML IRR UROMATIC (IV SOLUTION) ×1 IMPLANT
SURGILUBE 2OZ TUBE FLIPTOP (MISCELLANEOUS) ×3 IMPLANT
WATER STERILE IRR 1000ML POUR (IV SOLUTION) ×3 IMPLANT

## 2019-12-12 NOTE — Interval H&P Note (Signed)
History and Physical Interval Note:  12/12/2019 9:04 AM  Timothy Eaton  has presented today for surgery, with the diagnosis of left ureteral stone.  The various methods of treatment have been discussed with the patient and family. After consideration of risks, benefits and other options for treatment, the patient has consented to  Procedure(s): [place] (Left) as a surgical intervention.  The patient's history has been reviewed, patient examined, no change in status, stable for surgery.  I have reviewed the patient's chart and labs.  Questions were answered to the patient's satisfaction.     Marton Redwood, III

## 2019-12-12 NOTE — Consult Note (Signed)
H&P Physician requesting consult: Ivor Costa  Chief Complaint: Left ureteral calculus, UTI  History of Present Illness: 79 year old male with a history of recurrent urinary tract infections presented with a 4-day history of left-sided flank pain with some nausea and occasional vomiting.  He had a self-reported fever of 101 at home.  He was seen 3 days ago by primary care physician and diagnosed with a urinary tract infection.  Urine culture was positive for Enterococcus.  He presented to the emergency department.  He was stable at that time without fever, leukocytosis, or hemodynamic instability.  He was admitted to the hospital for observation.  I was consulted for the left ureteral calculus.  He was feeling good this morning without any fever, pain, nausea, vomiting.  Past Medical History:  Diagnosis Date  . AAA (abdominal aortic aneurysm) ([place])    [name], Repaired, 2005  . Atrial fibrillation (HCC)    Postoperative in past  . Carotid artery disease ([place])    Minimal, doppler 2004, 0-39% bilateral, doppler 05/17/09 0-39% bilateral  . COPD (chronic obstructive pulmonary disease) ([place])   . Coronary artery disease    Moderate, catheterization 2005, nuclear scan 9/09 no ischemia, EF 60%, echo  . ED (erectile dysfunction)   . Ejection fraction    EF 60% echo, September, 2009  . GERD (gastroesophageal reflux disease)   . Hemorrhoids   . Hyperlipidemia   . Hypertension   . Lung nodule    Followup by pulmonary in past  . Palpitations    History palpitations in the past  . Palpitations    January, 2013  . Shortness of breath    Past Surgical History:  Procedure Laterality Date  . ABDOMINAL AORTIC ANEURYSM REPAIR  04/06/04   Dr. [name]  . BACK SURGERY    . CHOLECYSTECTOMY  11/26/02  . PILONIDAL CYST EXCISION  11/11    Home Medications:  Medications Prior to Admission  Medication Sig Dispense Refill Last Dose  . aspirin 81 MG EC tablet Take 81 mg by mouth daily.     12/11/2019 at  Unknown time  . cefUROXime (CEFTIN) 500 MG tablet Take 1 tablet (500 mg total) by mouth 2 (two) times daily with a meal. 14 tablet 0 12/11/2019 at Unknown time  . diltiazem (CARDIZEM CD) 240 MG 24 hr capsule TAKE 1 CAPSULE BY MOUTH EVERY DAY (Patient taking differently: Take 240 mg by mouth daily. ) 90 capsule 2 12/11/2019 at Unknown time  . metoprolol tartrate (LOPRESSOR) 25 MG tablet TAKE 1 TABLET BY MOUTH TWICE DAILY. PLEASE KEEP APPOINTMENT IN JULY WITH DOCTOR NAHSER BFOR FUTURE REFILLS (Patient taking differently: Take 25 mg by mouth 2 (two) times daily. ) 180 tablet 2 12/11/2019 at Unknown time  . rosuvastatin (CRESTOR) 20 MG tablet TAKE 1 TABLET BY MOUTH ONCE DAILY (Patient taking differently: Take 20 mg by mouth daily. ) 90 tablet 3 12/11/2019 at Unknown time  . tamsulosin (FLOMAX) 0.4 MG CAPS capsule Take 1 capsule (0.4 mg total) by mouth daily. 90 capsule 3 12/11/2019 at Unknown time   Allergies:  Allergies  Allergen Reactions  . Sulfamethoxazole-Trimethoprim     REACTION: rash    Family History  Problem Relation Age of Onset  . Hypertension Mother        ?  . Stroke Mother        CVA  . Diabetes Mother        DM  . Heart attack Father        MI  .  Cancer Sister        Gall bladder   . Stroke Other        All mom's family died of strokes   Social History:  reports that he quit smoking about 17 years ago. His smokeless tobacco use includes chew. He reports that he does not drink alcohol or use drugs.  ROS: A complete review of systems was performed.  All systems are negative except for pertinent findings as noted. ROS   Physical Exam:  Vital signs in last 24 hours: Temp:  [97.6 F (36.4 C)-99.2 F (37.3 C)] 97.6 F (36.4 C) (05/08 0826) Pulse Rate:  [60-79] 73 (05/08 0827) Resp:  [16-18] 16 (05/08 0826) BP: (90-141)/(46-82) 136/82 (05/08 0827) SpO2:  [93 %-97 %] 97 % (05/08 0826) Weight:  [73.9 kg] 73.9 kg (05/07 1010) General:  Alert and oriented, No acute  distress HEENT: Normocephalic, atraumatic Neck: No JVD or lymphadenopathy Cardiovascular: Regular rate and rhythm Lungs: Regular rate and effort Abdomen: Soft, nontender, nondistended, no abdominal masses Back: No CVA tenderness Extremities: No edema Neurologic: Grossly intact  Laboratory Data:  Results for orders placed or performed during the hospital encounter of 12/11/19 (from the past 24 hour(s))  Lactic acid, plasma     Status: None   Collection Time: 12/11/19 10:02 AM  Result Value Ref Range   Lactic Acid, Venous 1.9 0.5 - 1.9 mmol/L  Comprehensive metabolic panel     Status: Abnormal   Collection Time: 12/11/19 10:02 AM  Result Value Ref Range   Sodium 136 135 - 145 mmol/L   Potassium 4.2 3.5 - 5.1 mmol/L   Chloride 102 98 - 111 mmol/L   CO2 26 22 - 32 mmol/L   Glucose, Bld 133 (H) 70 - 99 mg/dL   BUN 30 (H) 8 - 23 mg/dL   Creatinine, Ser 1.98 (H) 0.61 - 1.24 mg/dL   Calcium 9.1 8.9 - 10.3 mg/dL   Total Protein 6.8 6.5 - 8.1 g/dL   Albumin 3.7 3.5 - 5.0 g/dL   AST 24 15 - 41 U/L   ALT 14 0 - 44 U/L   Alkaline Phosphatase 53 38 - 126 U/L   Total Bilirubin 1.0 0.3 - 1.2 mg/dL   GFR calc non Af Amer 31 (L) >60 mL/min   GFR calc Af Amer 36 (L) >60 mL/min   Anion gap 8 5 - 15  CBC with Differential     Status: None   Collection Time: 12/11/19 10:02 AM  Result Value Ref Range   WBC 6.4 4.0 - 10.5 K/uL   RBC 5.17 4.22 - 5.81 MIL/uL   Hemoglobin 15.7 13.0 - 17.0 g/dL   HCT 46.5 39.0 - 52.0 %   MCV 89.9 80.0 - 100.0 fL   MCH 30.4 26.0 - 34.0 pg   MCHC 33.8 30.0 - 36.0 g/dL   RDW 12.9 11.5 - 15.5 %   Platelets 165 150 - 400 K/uL   nRBC 0.0 0.0 - 0.2 %   Neutrophils Relative % 69 %   Neutro Abs 4.5 1.7 - 7.7 K/uL   Lymphocytes Relative 13 %   Lymphs Abs 0.8 0.7 - 4.0 K/uL   Monocytes Relative 16 %   Monocytes Absolute 1.0 0.1 - 1.0 K/uL   Eosinophils Relative 1 %   Eosinophils Absolute 0.1 0.0 - 0.5 K/uL   Basophils Relative 0 %   Basophils Absolute 0.0 0.0 -  0.1 K/uL   Immature Granulocytes 1 %   Abs Immature Granulocytes  0.03 0.00 - 0.07 K/uL  Urinalysis, Complete w Microscopic     Status: Abnormal   Collection Time: 12/11/19 10:02 AM  Result Value Ref Range   Color, Urine YELLOW (A) YELLOW   APPearance CLEAR (A) CLEAR   Specific Gravity, Urine 1.014 1.005 - 1.030   pH 5.0 5.0 - 8.0   Glucose, UA NEGATIVE NEGATIVE mg/dL   Hgb urine dipstick LARGE (A) NEGATIVE   Bilirubin Urine NEGATIVE NEGATIVE   Ketones, ur NEGATIVE NEGATIVE mg/dL   Protein, ur NEGATIVE NEGATIVE mg/dL   Nitrite NEGATIVE NEGATIVE   Leukocytes,Ua MODERATE (A) NEGATIVE   RBC / HPF 6-10 0 - 5 RBC/hpf   WBC, UA 21-50 0 - 5 WBC/hpf   Bacteria, UA NONE SEEN NONE SEEN   Squamous Epithelial / LPF 0-5 0 - 5   Mucus PRESENT    Hyaline Casts, UA PRESENT   SARS CORONAVIRUS 2 (TAT 6-24 HRS) Nasopharyngeal Nasopharyngeal Swab     Status: None   Collection Time: 12/11/19 12:43 PM   Specimen: Nasopharyngeal Swab  Result Value Ref Range   SARS Coronavirus 2 NEGATIVE NEGATIVE  CULTURE, BLOOD (ROUTINE X 2) w Reflex to ID Panel     Status: None (Preliminary result)   Collection Time: 12/11/19  1:04 PM   Specimen: BLOOD  Result Value Ref Range   Specimen Description BLOOD RIGHT ANTECUBITAL    Special Requests      BOTTLES DRAWN AEROBIC AND ANAEROBIC Blood Culture adequate volume   Culture      NO GROWTH < 24 HOURS Performed at [facility], [address] [place], [place] [address]    Report Status PENDING   CULTURE, BLOOD (ROUTINE X 2) w Reflex to ID Panel     Status: None (Preliminary result)   Collection Time: 12/11/19  1:04 PM   Specimen: BLOOD  Result Value Ref Range   Specimen Description BLOOD BLOOD RIGHT ARM    Special Requests      BOTTLES DRAWN AEROBIC AND ANAEROBIC Blood Culture adequate volume   Culture      NO GROWTH < 24 HOURS Performed at [facility], [address] [place], [place] [address]    Report Status PENDING   Protime-INR      Status: None   Collection Time: 12/11/19  4:01 PM  Result Value Ref Range   Prothrombin Time 13.8 11.4 - 15.2 seconds   INR 1.1 0.8 - 1.2  Basic metabolic panel     Status: Abnormal   Collection Time: 12/12/19  5:37 AM  Result Value Ref Range   Sodium 138 135 - 145 mmol/L   Potassium 4.2 3.5 - 5.1 mmol/L   Chloride 106 98 - 111 mmol/L   CO2 26 22 - 32 mmol/L   Glucose, Bld 90 70 - 99 mg/dL   BUN 29 (H) 8 - 23 mg/dL   Creatinine, Ser 1.80 (H) 0.61 - 1.24 mg/dL   Calcium 8.3 (L) 8.9 - 10.3 mg/dL   GFR calc non Af Amer 35 (L) >60 mL/min   GFR calc Af Amer 40 (L) >60 mL/min   Anion gap 6 5 - 15  CBC     Status: None   Collection Time: 12/12/19  5:37 AM  Result Value Ref Range   WBC 4.0 4.0 - 10.5 K/uL   RBC 4.38 4.22 - 5.81 MIL/uL   Hemoglobin 13.7 13.0 - 17.0 g/dL   HCT 39.1 39.0 - 52.0 %   MCV 89.3 80.0 - 100.0  fL   MCH 31.3 26.0 - 34.0 pg   MCHC 35.0 30.0 - 36.0 g/dL   RDW 12.8 11.5 - 15.5 %   Platelets 167 150 - 400 K/uL   nRBC 0.0 0.0 - 0.2 %   Recent Results (from the past 240 hour(s))  Urine Culture     Status: Abnormal   Collection Time: 12/09/19  2:17 PM   Specimen: Urine  Result Value Ref Range Status   MICRO NUMBER: [id number]  Final   SPECIMEN QUALITY: Adequate  Final   Sample Source NOT GIVEN  Final   STATUS: FINAL  Final   ISOLATE 1: Enterococcus faecalis (A)  Final    Comment: Greater than 100,000 CFU/mL of Enterococcus faecalis      Susceptibility   Enterococcus faecalis - URINE CULTURE POSITIVE 1    AMPICILLIN <=2 Sensitive     VANCOMYCIN 2 Sensitive     NITROFURANTOIN* <=16 Sensitive      * Legend:S = Susceptible  I = IntermediateR = Resistant  NS = Not susceptible* = Not tested  NR = Not reported**NN = See antimicrobic comments  SARS CORONAVIRUS 2 (TAT 6-24 HRS) Nasopharyngeal Nasopharyngeal Swab     Status: None   Collection Time: 12/11/19 12:43 PM   Specimen: Nasopharyngeal Swab  Result Value Ref Range Status   SARS Coronavirus 2 NEGATIVE NEGATIVE  Final    Comment: (NOTE) SARS-CoV-2 target nucleic acids are NOT DETECTED. The SARS-CoV-2 RNA is generally detectable in upper and lower respiratory specimens during the acute phase of infection. Negative results do not preclude SARS-CoV-2 infection, do not rule out co-infections with other pathogens, and should not be used as the sole basis for treatment or other patient management decisions. Negative results must be combined with clinical observations, patient history, and epidemiological information. The expected result is Negative. Fact Sheet for Patients: SugarRoll.be Fact Sheet for Healthcare Providers: https://www.woods-mathews.com/ This test is not yet approved or cleared by the [place] FDA and  has been authorized for detection and/or diagnosis of SARS-CoV-2 by FDA under an Emergency Use Authorization (EUA). This EUA will remain  in effect (meaning this test can be used) for the duration of the COVID-19 declaration under Section 56 4(b)(1) of the Act, 21 U.S.C. section 360bbb-3(b)(1), unless the authorization is terminated or revoked sooner. Performed at [facility], [place] [address], [place], [place] [address]   CULTURE, BLOOD (ROUTINE X 2) w Reflex to ID Panel     Status: None (Preliminary result)   Collection Time: 12/11/19  1:04 PM   Specimen: BLOOD  Result Value Ref Range Status   Specimen Description BLOOD RIGHT ANTECUBITAL  Final   Special Requests   Final    BOTTLES DRAWN AEROBIC AND ANAEROBIC Blood Culture adequate volume   Culture   Final    NO GROWTH < 24 HOURS Performed at [facility], [address], [place],  [address]    Report Status PENDING  Incomplete  CULTURE, BLOOD (ROUTINE X 2) w Reflex to ID Panel     Status: None (Preliminary result)   Collection Time: 12/11/19  1:04 PM   Specimen: BLOOD  Result Value Ref Range Status   Specimen Description BLOOD BLOOD RIGHT ARM  Final    Special Requests   Final    BOTTLES DRAWN AEROBIC AND ANAEROBIC Blood Culture adequate volume   Culture   Final    NO GROWTH < 24 HOURS Performed at [facility], [place],  [place] [address]    Report Status PENDING  Incomplete   Creatinine: Recent Labs    12/09/19 1314 12/11/19 1002 12/12/19 0537  CREATININE 2.13* 1.98* 1.80*   CT scan personally reviewed and is detailed in the history of present illness  Impression/Assessment:  Left ureteral calculus Urinary tract infection  Plan:  Plan for cystoscopy with left ureteral stent placement.  Risk benefits discussed.  Importance of follow-up afterwards was discussed.  Marton Redwood, III 12/12/2019, 8:59 AM

## 2019-12-12 NOTE — H&P (View-Only) (Signed)
H&P Physician requesting consult: Ivor Costa  Chief Complaint: Left ureteral calculus, UTI  History of Present Illness: 79 year old male with a history of recurrent urinary tract infections presented with a 4-day history of left-sided flank pain with some nausea and occasional vomiting.  He had a self-reported fever of 101 at home.  He was seen 3 days ago by primary care physician and diagnosed with a urinary tract infection.  Urine culture was positive for Enterococcus.  He presented to the emergency department.  He was stable at that time without fever, leukocytosis, or hemodynamic instability.  He was admitted to the hospital for observation.  I was consulted for the left ureteral calculus.  He was feeling good this morning without any fever, pain, nausea, vomiting.  Past Medical History:  Diagnosis Date  . AAA (abdominal aortic aneurysm) ([place])    [name], Repaired, 2005  . Atrial fibrillation ([facility])    Postoperative in past  . Carotid artery disease ([place])    Minimal, doppler 2004, 0-39% bilateral, doppler 05/17/09 0-39% bilateral  . COPD (chronic obstructive pulmonary disease) ([place])   . Coronary artery disease    Moderate, catheterization 2005, nuclear scan 9/09 no ischemia, EF 60%, echo  . ED (erectile dysfunction)   . Ejection fraction    EF 60% echo, September, 2009  . GERD (gastroesophageal reflux disease)   . Hemorrhoids   . Hyperlipidemia   . Hypertension   . Lung nodule    Followup by pulmonary in past  . Palpitations    History palpitations in the past  . Palpitations    January, 2013  . Shortness of breath    Past Surgical History:  Procedure Laterality Date  . ABDOMINAL AORTIC ANEURYSM REPAIR  04/06/04   Dr. [name]  . BACK SURGERY    . CHOLECYSTECTOMY  11/26/02  . PILONIDAL CYST EXCISION  11/11    Home Medications:  Medications Prior to Admission  Medication Sig Dispense Refill Last Dose  . aspirin 81 MG EC tablet Take 81 mg by mouth daily.     12/11/2019 at  Unknown time  . cefUROXime (CEFTIN) 500 MG tablet Take 1 tablet (500 mg total) by mouth 2 (two) times daily with a meal. 14 tablet 0 12/11/2019 at Unknown time  . diltiazem (CARDIZEM CD) 240 MG 24 hr capsule TAKE 1 CAPSULE BY MOUTH EVERY DAY (Patient taking differently: Take 240 mg by mouth daily. ) 90 capsule 2 12/11/2019 at Unknown time  . metoprolol tartrate (LOPRESSOR) 25 MG tablet TAKE 1 TABLET BY MOUTH TWICE DAILY. PLEASE KEEP APPOINTMENT IN JULY WITH DOCTOR NAHSER BFOR FUTURE REFILLS (Patient taking differently: Take 25 mg by mouth 2 (two) times daily. ) 180 tablet 2 12/11/2019 at Unknown time  . rosuvastatin (CRESTOR) 20 MG tablet TAKE 1 TABLET BY MOUTH ONCE DAILY (Patient taking differently: Take 20 mg by mouth daily. ) 90 tablet 3 12/11/2019 at Unknown time  . tamsulosin (FLOMAX) 0.4 MG CAPS capsule Take 1 capsule (0.4 mg total) by mouth daily. 90 capsule 3 12/11/2019 at Unknown time   Allergies:  Allergies  Allergen Reactions  . Sulfamethoxazole-Trimethoprim     REACTION: rash    Family History  Problem Relation Age of Onset  . Hypertension Mother        ?  . Stroke Mother        CVA  . Diabetes Mother        DM  . Heart attack Father        MI  .  Cancer Sister        Gall bladder   . Stroke Other        All mom's family died of strokes   Social History:  reports that he quit smoking about 17 years ago. His smokeless tobacco use includes chew. He reports that he does not drink alcohol or use drugs.  ROS: A complete review of systems was performed.  All systems are negative except for pertinent findings as noted. ROS   Physical Exam:  Vital signs in last 24 hours: Temp:  [97.6 F (36.4 C)-99.2 F (37.3 C)] 97.6 F (36.4 C) (05/08 0826) Pulse Rate:  [60-79] 73 (05/08 0827) Resp:  [16-18] 16 (05/08 0826) BP: (90-141)/(46-82) 136/82 (05/08 0827) SpO2:  [93 %-97 %] 97 % (05/08 0826) Weight:  [73.9 kg] 73.9 kg (05/07 1010) General:  Alert and oriented, No acute  distress HEENT: Normocephalic, atraumatic Neck: No JVD or lymphadenopathy Cardiovascular: Regular rate and rhythm Lungs: Regular rate and effort Abdomen: Soft, nontender, nondistended, no abdominal masses Back: No CVA tenderness Extremities: No edema Neurologic: Grossly intact  Laboratory Data:  Results for orders placed or performed during the hospital encounter of 12/11/19 (from the past 24 hour(s))  Lactic acid, plasma     Status: None   Collection Time: 12/11/19 10:02 AM  Result Value Ref Range   Lactic Acid, Venous 1.9 0.5 - 1.9 mmol/L  Comprehensive metabolic panel     Status: Abnormal   Collection Time: 12/11/19 10:02 AM  Result Value Ref Range   Sodium 136 135 - 145 mmol/L   Potassium 4.2 3.5 - 5.1 mmol/L   Chloride 102 98 - 111 mmol/L   CO2 26 22 - 32 mmol/L   Glucose, Bld 133 (H) 70 - 99 mg/dL   BUN 30 (H) 8 - 23 mg/dL   Creatinine, Ser 1.98 (H) 0.61 - 1.24 mg/dL   Calcium 9.1 8.9 - 10.3 mg/dL   Total Protein 6.8 6.5 - 8.1 g/dL   Albumin 3.7 3.5 - 5.0 g/dL   AST 24 15 - 41 U/L   ALT 14 0 - 44 U/L   Alkaline Phosphatase 53 38 - 126 U/L   Total Bilirubin 1.0 0.3 - 1.2 mg/dL   GFR calc non Af Amer 31 (L) >60 mL/min   GFR calc Af Amer 36 (L) >60 mL/min   Anion gap 8 5 - 15  CBC with Differential     Status: None   Collection Time: 12/11/19 10:02 AM  Result Value Ref Range   WBC 6.4 4.0 - 10.5 K/uL   RBC 5.17 4.22 - 5.81 MIL/uL   Hemoglobin 15.7 13.0 - 17.0 g/dL   HCT 46.5 39.0 - 52.0 %   MCV 89.9 80.0 - 100.0 fL   MCH 30.4 26.0 - 34.0 pg   MCHC 33.8 30.0 - 36.0 g/dL   RDW 12.9 11.5 - 15.5 %   Platelets 165 150 - 400 K/uL   nRBC 0.0 0.0 - 0.2 %   Neutrophils Relative % 69 %   Neutro Abs 4.5 1.7 - 7.7 K/uL   Lymphocytes Relative 13 %   Lymphs Abs 0.8 0.7 - 4.0 K/uL   Monocytes Relative 16 %   Monocytes Absolute 1.0 0.1 - 1.0 K/uL   Eosinophils Relative 1 %   Eosinophils Absolute 0.1 0.0 - 0.5 K/uL   Basophils Relative 0 %   Basophils Absolute 0.0 0.0 -  0.1 K/uL   Immature Granulocytes 1 %   Abs Immature Granulocytes  0.03 0.00 - 0.07 K/uL  Urinalysis, Complete w Microscopic     Status: Abnormal   Collection Time: 12/11/19 10:02 AM  Result Value Ref Range   Color, Urine YELLOW (A) YELLOW   APPearance CLEAR (A) CLEAR   Specific Gravity, Urine 1.014 1.005 - 1.030   pH 5.0 5.0 - 8.0   Glucose, UA NEGATIVE NEGATIVE mg/dL   Hgb urine dipstick LARGE (A) NEGATIVE   Bilirubin Urine NEGATIVE NEGATIVE   Ketones, ur NEGATIVE NEGATIVE mg/dL   Protein, ur NEGATIVE NEGATIVE mg/dL   Nitrite NEGATIVE NEGATIVE   Leukocytes,Ua MODERATE (A) NEGATIVE   RBC / HPF 6-10 0 - 5 RBC/hpf   WBC, UA 21-50 0 - 5 WBC/hpf   Bacteria, UA NONE SEEN NONE SEEN   Squamous Epithelial / LPF 0-5 0 - 5   Mucus PRESENT    Hyaline Casts, UA PRESENT   SARS CORONAVIRUS 2 (TAT 6-24 HRS) Nasopharyngeal Nasopharyngeal Swab     Status: None   Collection Time: 12/11/19 12:43 PM   Specimen: Nasopharyngeal Swab  Result Value Ref Range   SARS Coronavirus 2 NEGATIVE NEGATIVE  CULTURE, BLOOD (ROUTINE X 2) w Reflex to ID Panel     Status: None (Preliminary result)   Collection Time: 12/11/19  1:04 PM   Specimen: BLOOD  Result Value Ref Range   Specimen Description BLOOD RIGHT ANTECUBITAL    Special Requests      BOTTLES DRAWN AEROBIC AND ANAEROBIC Blood Culture adequate volume   Culture      NO GROWTH < 24 HOURS Performed at [facility], [address] [place], [place] [address]    Report Status PENDING   CULTURE, BLOOD (ROUTINE X 2) w Reflex to ID Panel     Status: None (Preliminary result)   Collection Time: 12/11/19  1:04 PM   Specimen: BLOOD  Result Value Ref Range   Specimen Description BLOOD BLOOD RIGHT ARM    Special Requests      BOTTLES DRAWN AEROBIC AND ANAEROBIC Blood Culture adequate volume   Culture      NO GROWTH < 24 HOURS Performed at [facility], [address] [place], [place] [address]    Report Status PENDING   Protime-INR      Status: None   Collection Time: 12/11/19  4:01 PM  Result Value Ref Range   Prothrombin Time 13.8 11.4 - 15.2 seconds   INR 1.1 0.8 - 1.2  Basic metabolic panel     Status: Abnormal   Collection Time: 12/12/19  5:37 AM  Result Value Ref Range   Sodium 138 135 - 145 mmol/L   Potassium 4.2 3.5 - 5.1 mmol/L   Chloride 106 98 - 111 mmol/L   CO2 26 22 - 32 mmol/L   Glucose, Bld 90 70 - 99 mg/dL   BUN 29 (H) 8 - 23 mg/dL   Creatinine, Ser 1.80 (H) 0.61 - 1.24 mg/dL   Calcium 8.3 (L) 8.9 - 10.3 mg/dL   GFR calc non Af Amer 35 (L) >60 mL/min   GFR calc Af Amer 40 (L) >60 mL/min   Anion gap 6 5 - 15  CBC     Status: None   Collection Time: 12/12/19  5:37 AM  Result Value Ref Range   WBC 4.0 4.0 - 10.5 K/uL   RBC 4.38 4.22 - 5.81 MIL/uL   Hemoglobin 13.7 13.0 - 17.0 g/dL   HCT 39.1 39.0 - 52.0 %   MCV 89.3 80.0 - 100.0  fL   MCH 31.3 26.0 - 34.0 pg   MCHC 35.0 30.0 - 36.0 g/dL   RDW 12.8 11.5 - 15.5 %   Platelets 167 150 - 400 K/uL   nRBC 0.0 0.0 - 0.2 %   Recent Results (from the past 240 hour(s))  Urine Culture     Status: Abnormal   Collection Time: 12/09/19  2:17 PM   Specimen: Urine  Result Value Ref Range Status   MICRO NUMBER: [id number]  Final   SPECIMEN QUALITY: Adequate  Final   Sample Source NOT GIVEN  Final   STATUS: FINAL  Final   ISOLATE 1: Enterococcus faecalis (A)  Final    Comment: Greater than 100,000 CFU/mL of Enterococcus faecalis      Susceptibility   Enterococcus faecalis - URINE CULTURE POSITIVE 1    AMPICILLIN <=2 Sensitive     VANCOMYCIN 2 Sensitive     NITROFURANTOIN* <=16 Sensitive      * Legend:S = Susceptible  I = IntermediateR = Resistant  NS = Not susceptible* = Not tested  NR = Not reported**NN = See antimicrobic comments  SARS CORONAVIRUS 2 (TAT 6-24 HRS) Nasopharyngeal Nasopharyngeal Swab     Status: None   Collection Time: 12/11/19 12:43 PM   Specimen: Nasopharyngeal Swab  Result Value Ref Range Status   SARS Coronavirus 2 NEGATIVE NEGATIVE  Final    Comment: (NOTE) SARS-CoV-2 target nucleic acids are NOT DETECTED. The SARS-CoV-2 RNA is generally detectable in upper and lower respiratory specimens during the acute phase of infection. Negative results do not preclude SARS-CoV-2 infection, do not rule out co-infections with other pathogens, and should not be used as the sole basis for treatment or other patient management decisions. Negative results must be combined with clinical observations, patient history, and epidemiological information. The expected result is Negative. Fact Sheet for Patients: SugarRoll.be Fact Sheet for Healthcare Providers: https://www.woods-mathews.com/ This test is not yet approved or cleared by the [place] FDA and  has been authorized for detection and/or diagnosis of SARS-CoV-2 by FDA under an Emergency Use Authorization (EUA). This EUA will remain  in effect (meaning this test can be used) for the duration of the COVID-19 declaration under Section 56 4(b)(1) of the Act, 21 U.S.C. section 360bbb-3(b)(1), unless the authorization is terminated or revoked sooner. Performed at [facility], [place] [address], [place], [place] [address]   CULTURE, BLOOD (ROUTINE X 2) w Reflex to ID Panel     Status: None (Preliminary result)   Collection Time: 12/11/19  1:04 PM   Specimen: BLOOD  Result Value Ref Range Status   Specimen Description BLOOD RIGHT ANTECUBITAL  Final   Special Requests   Final    BOTTLES DRAWN AEROBIC AND ANAEROBIC Blood Culture adequate volume   Culture   Final    NO GROWTH < 24 HOURS Performed at [facility], [address], [place], [place] [address]    Report Status PENDING  Incomplete  CULTURE, BLOOD (ROUTINE X 2) w Reflex to ID Panel     Status: None (Preliminary result)   Collection Time: 12/11/19  1:04 PM   Specimen: BLOOD  Result Value Ref Range Status   Specimen Description BLOOD BLOOD RIGHT ARM  Final    Special Requests   Final    BOTTLES DRAWN AEROBIC AND ANAEROBIC Blood Culture adequate volume   Culture   Final    NO GROWTH < 24 HOURS Performed at [facility], [place],  [place] [address]    Report Status PENDING  Incomplete   Creatinine: Recent Labs    12/09/19 1314 12/11/19 1002 12/12/19 0537  CREATININE 2.13* 1.98* 1.80*   CT scan personally reviewed and is detailed in the history of present illness  Impression/Assessment:  Left ureteral calculus Urinary tract infection  Plan:  Plan for cystoscopy with left ureteral stent placement.  Risk benefits discussed.  Importance of follow-up afterwards was discussed.  Marton Redwood, III 12/12/2019, 8:59 AM

## 2019-12-12 NOTE — Progress Notes (Signed)
PROGRESS NOTE    Timothy Eaton  BEM:[id number] DOB: [birth year] DOA: 12/11/2019 PCP: [name], MD   Brief Narrative:  Timothy Eaton is a 79 y.o. male with medical history significant of hypertension, hyperlipidemia, COPD, CAD, atrial fibrillation not on anticoagulants, AAA, BPH, CKD stage III, GERD, who presents with left flank pain.  He also had a fever at home.  Is a recent urine culture grow Enterococcus faecalis.  The scan showed 7 mm obstructing calculus in the proximal third of the left ureter resulting in hydroureteronephrosis.  He is placed on Unasyn.  He was also brought to surgery on 5/8 by Dr. Gloriann Loan had a ureteral stent.   Assessment & Plan:   Principal Problem:   Acute pyelonephritis Active Problems:   Hyperlipidemia   Essential hypertension   COPD (chronic obstructive pulmonary disease) with emphysema (HCC)   Coronary artery disease   Atrial fibrillation (HCC)   BPH with obstruction/lower urinary tract symptoms   Acute renal failure superimposed on stage 3a chronic kidney disease ([place])  #1.  Acute pyelonephritis secondary to obstructing kidney stone. Post ureteral stent.  Patient doing well today.  Continue IV fluids as well as Unasyn.  I discussed with Dr. Gloriann Loan, patient will need 2 weeks antibiotics from today.  We will follow up in his office to remove the stent in the future.  He probably can be discharged tomorrow.  #2.  Acute kidney injury.  Secondary to obstructing kidney stone. Continue IV fluids.  Recheck a BMP tomorrow.  3.  Essential hypertension. Continues on home medicines.  4.  COPD. No exacerbation.  5.  Atrial fibrillation.  Not chronically on anticoagulation.  Continue rate control.   DVT prophylaxis: (Lovenox/Heparin/SCD's/anticoagulated/None (if comfort care) Code Status: (Full/Partial - specify details) Family Communication: (Specify name, relationship & date discussed. NO "discussed with patient") Disposition Plan:   Patient came  from:Home             Anticipated d/c place:Home  Barriers to d/c OR conditions which need to be met to effect a safe d/c: None  Consultants:   Urology.  Procedures:  Cystoscopy with ureteral stent. Antimicrobials: Unasyn.  Subjective: Doing well postop.  No nausea vomiting abdominal pain. No short of breath or cough. Some dysuria with small amount of blood in urine.  Objective: Vitals:   12/12/19 1017 12/12/19 1021 12/12/19 1031 12/12/19 1114  BP:  136/76 138/74 (!) 141/82  Pulse: 61 61 60 64  Resp: (!) 23 (!) 24 16 18   Temp: 97.9 F (36.6 C)  98.3 F (36.8 C) (!) 97.5 F (36.4 C)  TempSrc:   Oral Axillary  SpO2: 95% 93% 96% 97%  Weight:      Height:        Intake/Output Summary (Last 24 hours) at 12/12/2019 1128 Last data filed at 12/12/2019 1053 Gross per 24 hour  Intake 2016.75 ml  Output 275 ml  Net 1741.75 ml   Filed Weights   12/11/19 1010  Weight: 73.9 kg    Examination:  General exam: Appears calm and comfortable  Respiratory system: Clear to auscultation. Respiratory effort normal. Cardiovascular system: Irregularly irregular. No JVD, murmurs, rubs, gallops or clicks. No pedal edema. Gastrointestinal system: Abdomen is nondistended, soft and nontender. No organomegaly or masses felt. Normal bowel sounds heard. Central nervous system: Alert and oriented. No focal neurological deficits. Extremities: Symmetric 5 x 5 power. Skin: No rashes, lesions or ulcers Psychiatry: Judgement and insight appear normal. Mood & affect appropriate.  Data Reviewed: I have personally reviewed following labs and imaging studies  CBC: Recent Labs  Lab 12/09/19 1314 12/11/19 1002 12/12/19 0537  WBC 9.8 6.4 4.0  NEUTROABS  --  4.5  --   HGB 15.6 15.7 13.7  HCT 46.5 46.5 39.1  MCV 92.3 89.9 89.3  PLT 202.0 165 630   Basic Metabolic Panel: Recent Labs  Lab 12/09/19 1314 12/11/19 1002 12/12/19 0537  NA 140 136 138  K 4.1 4.2 4.2  CL 104 102 106  CO2 30  26 26   GLUCOSE 119* 133* 90  BUN 28* 30* 29*  CREATININE 2.13* 1.98* 1.80*  CALCIUM 9.2 9.1 8.3*   GFR: Estimated Creatinine Clearance: 32.7 mL/min (A) (by C-G formula based on SCr of 1.8 mg/dL (H)). Liver Function Tests: Recent Labs  Lab 12/09/19 1314 12/11/19 1002  AST 14 24  ALT 9 14  ALKPHOS 73 53  BILITOT 1.0 1.0  PROT 6.2 6.8  ALBUMIN 3.9 3.7   No results for input(s): LIPASE, AMYLASE in the last 168 hours. No results for input(s): AMMONIA in the last 168 hours. Coagulation Profile: Recent Labs  Lab 12/11/19 1601  INR 1.1   Cardiac Enzymes: No results for input(s): CKTOTAL, CKMB, CKMBINDEX, TROPONINI in the last 168 hours. BNP (last 3 results) No results for input(s): PROBNP in the last 8760 hours. HbA1C: No results for input(s): HGBA1C in the last 72 hours. CBG: No results for input(s): GLUCAP in the last 168 hours. Lipid Profile: No results for input(s): CHOL, HDL, LDLCALC, TRIG, CHOLHDL, LDLDIRECT in the last 72 hours. Thyroid Function Tests: Recent Labs    12/09/19 1314  FREET4 0.97   Anemia Panel: Recent Labs    12/09/19 1314  VITAMINB12 171*   Sepsis Labs: Recent Labs  Lab 12/11/19 1002  LATICACIDVEN 1.9    Recent Results (from the past 240 hour(s))  Urine Culture     Status: Abnormal   Collection Time: 12/09/19  2:17 PM   Specimen: Urine  Result Value Ref Range Status   MICRO NUMBER: [id number]  Final   SPECIMEN QUALITY: Adequate  Final   Sample Source NOT GIVEN  Final   STATUS: FINAL  Final   ISOLATE 1: Enterococcus faecalis (A)  Final    Comment: Greater than 100,000 CFU/mL of Enterococcus faecalis      Susceptibility   Enterococcus faecalis - URINE CULTURE POSITIVE 1    AMPICILLIN <=2 Sensitive     VANCOMYCIN 2 Sensitive     NITROFURANTOIN* <=16 Sensitive      * Legend:S = Susceptible  I = IntermediateR = Resistant  NS = Not susceptible* = Not tested  NR = Not reported**NN = See antimicrobic comments  SARS CORONAVIRUS 2 (TAT 6-24  HRS) Nasopharyngeal Nasopharyngeal Swab     Status: None   Collection Time: 12/11/19 12:43 PM   Specimen: Nasopharyngeal Swab  Result Value Ref Range Status   SARS Coronavirus 2 NEGATIVE NEGATIVE Final    Comment: (NOTE) SARS-CoV-2 target nucleic acids are NOT DETECTED. The SARS-CoV-2 RNA is generally detectable in upper and lower respiratory specimens during the acute phase of infection. Negative results do not preclude SARS-CoV-2 infection, do not rule out co-infections with other pathogens, and should not be used as the sole basis for treatment or other patient management decisions. Negative results must be combined with clinical observations, patient history, and epidemiological information. The expected result is Negative. Fact Sheet for Patients: SugarRoll.be Fact Sheet for Healthcare Providers: https://www.woods-mathews.com/ This test is not  yet approved or cleared by the [place] and  has been authorized for detection and/or diagnosis of SARS-CoV-2 by FDA under an Emergency Use Authorization (EUA). This EUA will remain  in effect (meaning this test can be used) for the duration of the COVID-19 declaration under Section 56 4(b)(1) of the Act, 21 U.S.C. section 360bbb-3(b)(1), unless the authorization is terminated or revoked sooner. Performed at [facility], [place] [address], [place], [place] [address]   CULTURE, BLOOD (ROUTINE X 2) w Reflex to ID Panel     Status: None (Preliminary result)   Collection Time: 12/11/19  1:04 PM   Specimen: BLOOD  Result Value Ref Range Status   Specimen Description BLOOD RIGHT ANTECUBITAL  Final   Special Requests   Final    BOTTLES DRAWN AEROBIC AND ANAEROBIC Blood Culture adequate volume   Culture   Final    NO GROWTH < 24 HOURS Performed at [facility], [address] [place], [place] [address]    Report Status PENDING  Incomplete  CULTURE, BLOOD (ROUTINE X 2) w Reflex  to ID Panel     Status: None (Preliminary result)   Collection Time: 12/11/19  1:04 PM   Specimen: BLOOD  Result Value Ref Range Status   Specimen Description BLOOD BLOOD RIGHT ARM  Final   Special Requests   Final    BOTTLES DRAWN AEROBIC AND ANAEROBIC Blood Culture adequate volume   Culture   Final    NO GROWTH < 24 HOURS Performed at [facility], [address], [place], [place] [address]    Report Status PENDING  Incomplete         Radiology Studies: DG OR UROLOGY CYSTO IMAGE ([place])  Result Date: 12/12/2019 There is no interpretation for this exam.  This order is for images obtained during a surgical procedure.  Please See "Surgeries" Tab for more information regarding the procedure.   CT RENAL STONE STUDY  Result Date: 12/11/2019 CLINICAL DATA:  79 year old male with history of left-sided flank pain for the past several days. EXAM: CT ABDOMEN AND PELVIS WITHOUT CONTRAST TECHNIQUE: Multidetector CT imaging of the abdomen and pelvis was performed following the standard protocol without IV contrast. COMPARISON:  CT the abdomen and pelvis 03/06/2004. FINDINGS: Lower chest: Moderate to severe emphysema. 8 mm nodule in the left lower lobe (axial image 10 of series 7), similar to prior examination from 2005, considered benign. Atherosclerotic calcifications in the descending thoracic aorta as well as the left circumflex and right coronary arteries. Hepatobiliary: 8 mm low-attenuation lesion in segment 2 of the liver, incompletely characterized on today's non-contrast CT examination, but statistically likely to represent a tiny cyst. No other larger more suspicious appearing hepatic lesions are confidently identified on today's noncontrast CT examination. Status post cholecystectomy. Pancreas: No definite pancreatic mass or peripancreatic fluid collections or inflammatory changes confidently identified on today's noncontrast CT examination. Spleen: Unremarkable. Adrenals/Urinary  Tract: 7 mm calculus in the proximal third of the left ureter just distal to the left ureteropelvic junction, with mild to moderate proximal left hydroureteronephrosis. Moderate amount of left perinephric and periureteric soft tissue stranding. Additional nonobstructive calculi are noted within the collecting systems of both kidneys measuring up to 4 mm in the interpolar collecting system of the right kidney. No additional calculi are noted elsewhere along the course of either ureter or within the lumen of the urinary bladder. Multiple renal lesions bilaterally ranging from low to high attenuation, incompletely characterized on today's  non-contrast CT examination, but likely to represent a combination of cysts and hemorrhagic/proteinaceous cysts. These measure up to 2.3 cm in diameter in the upper pole the left kidney. Urinary bladder is normal in appearance. Bilateral adrenal glands are normal in appearance. Stomach/Bowel: Unenhanced appearance of the stomach is normal. Large duodenal diverticulum extending off the posterior aspect of the second portion of the duodenum, without surrounding inflammatory changes. No pathologic dilatation of small bowel or colon. Numerous colonic diverticulae are noted, without definite surrounding inflammatory changes to suggest an acute diverticulitis at this time. Normal appendix. Vascular/Lymphatic: Aortic atherosclerosis. No lymphadenopathy noted in the abdomen or pelvis. Reproductive: Prostate gland and seminal vesicles are unremarkable in appearance. Other: No significant volume of ascites.  No pneumoperitoneum. Musculoskeletal: There are no aggressive appearing lytic or blastic lesions noted in the visualized portions of the skeleton. IMPRESSION: 1. 7 mm calculus in the proximal third of the left ureter just distal to the left ureteropelvic junction with mild to moderate proximal left hydroureteronephrosis indicative of obstruction. 2. Multiple additional nonobstructive  calculi are noted within the collecting systems of both kidneys measuring up to 4 mm in the interpolar collecting system of the right kidney. 3. Numerous low, intermediate and high attenuation lesions in the kidneys bilaterally, incompletely characterized on today's noncontrast CT examination. These are statistically likely to represent cysts and proteinaceous/hemorrhagic cysts, and could be definitively characterized with nonemergent MRI of the abdomen with and without IV gadolinium if of clinical concern. 4. Severe colonic diverticulosis. 5. Aortic atherosclerosis, in addition to least 2 vessel coronary artery disease. 6. Moderate to severe emphysema. 7. Additional incidental findings, as above. Electronically Signed   By: Vinnie Langton M.D.   On: 12/11/2019 15:23        Scheduled Meds:  aspirin EC  81 mg Oral Daily   diltiazem  240 mg Oral Daily   feeding supplement (ENSURE ENLIVE)  237 mL Oral TID BM   heparin  5,000 Units Subcutaneous Q8H   metoprolol tartrate  25 mg Oral BID   multivitamin with minerals  1 tablet Oral Daily   rosuvastatin  20 mg Oral Daily   tamsulosin  0.4 mg Oral Daily   Continuous Infusions:  sodium chloride 75 mL/hr at 12/12/19 1035   ampicillin (OMNIPEN) IV 300 mL/hr at 12/12/19 0543     LOS: 0 days    Time spent: 28 mins    Sharen Hones, MD [facility]   To contact the attending provider between 7A-7P or the covering provider during after hours 7P-7A, please log into the web site [email or web address] and access using universal [place] password for that web site. If you do not have the password, please call the hospital operator.  12/12/2019, 11:28 AM

## 2019-12-12 NOTE — Anesthesia Postprocedure Evaluation (Signed)
Anesthesia Post Note  Patient: Timothy Eaton  Procedure(s) Performed: CYSTOSCOPY WITH STENT PLACEMENT (Left )  Patient location during evaluation: PACU Anesthesia Type: General Level of consciousness: awake and alert Pain management: pain level controlled Vital Signs Assessment: post-procedure vital signs reviewed and stable Respiratory status: spontaneous breathing, nonlabored ventilation, respiratory function stable and patient connected to nasal cannula oxygen Cardiovascular status: blood pressure returned to baseline and stable Postop Assessment: no apparent nausea or vomiting Anesthetic complications: no     Last Vitals:  Vitals:   12/12/19 1021 12/12/19 1031  BP: 136/76 138/74  Pulse: 61 60  Resp: (!) 24 16  Temp:  36.8 C  SpO2: 93% 96%    Last Pain:  Vitals:   12/12/19 1031  TempSrc: Oral  PainSc:                  Arita Miss

## 2019-12-12 NOTE — Discharge Instructions (Signed)

## 2019-12-12 NOTE — Op Note (Signed)
Operative Note  Preoperative diagnosis:  1.  Left ureteral calculus with urinary tract infection  Post operative diagnosis: 1.  Left ureteral calculus with urinary tract infection  Procedure(s): 1.  Cystoscopy with left retrograde pyelogram and left ureteral stent placement  Surgeon: Link Snuffer, MD  Assistants: None  Anesthesia: General  Complications: None immediate  EBL: Minimal  Specimens: 1.  None  Drains/Catheters: 1.  6 X 26 double-J ureteral stent  Intraoperative findings: 1.  Normal urethra and bladder 2.  Left retrograde pyelogram revealed a filling defect at the level of the stone with upstream hydroureteronephrosis  Indication: 79 year old male with a left ureteral calculus and positive urine culture presents for ureteral stent placement  Description of procedure:  The patient was identified and consent was obtained.  The patient was taken to the operating room and placed in the supine position.  The patient was placed under general anesthesia.  Perioperative antibiotics were administered.  The patient was placed in dorsal lithotomy.  Patient was prepped and draped in a standard sterile fashion and a timeout was performed.  A 21 French rigid cystoscope was advanced into the urethra and into the bladder.  The left distal most portion of the ureter was cannulated with an open-ended ureteral catheter.  Retrograde pyelogram was performed with the findings noted above.  A sensor wire was then advanced up to the kidney under fluoroscopic guidance.  A 6 X 26 double-J ureteral stent was advanced up to the kidney under fluoroscopic guidance.  The wire was withdrawn and fluoroscopy confirmed good proximal placement and direct visualization confirmed a good coil within the bladder.  The bladder was drained and the scope withdrawn.  This concluded the operation.  Patient tolerated procedure well and was stable postoperatively.  Plan: Keep 1 more day for observation.  If he  remains stable and afebrile, discharge with total of 2 weeks of antibiotics.  I will arrange for him to follow-up in 1 week in our clinic to discuss ureteroscopy/definitive management of the stone.

## 2019-12-12 NOTE — Transfer of Care (Signed)
Immediate Anesthesia Transfer of Care Note  Patient: Timothy Eaton  Procedure(s) Performed: CYSTOSCOPY WITH STENT PLACEMENT (Left )  Patient Location: PACU  Anesthesia Type:MAC  Level of Consciousness: sedated  Airway & Oxygen Therapy: Patient Spontanous Breathing and Patient connected to nasal cannula oxygen  Post-op Assessment: Report given to RN  Post vital signs: Reviewed and stable  Last Vitals:  Vitals Value Taken Time  BP 85/55 12/12/19 0951  Temp 36.1 C 12/12/19 0951  Pulse 56 12/12/19 0952  Resp 16 12/12/19 0952  SpO2 97 % 12/12/19 0952  Vitals shown include unvalidated device data.  Last Pain:  Vitals:   12/12/19 0951  TempSrc:   PainSc: Asleep         Complications: No apparent anesthesia complications

## 2019-12-13 DIAGNOSIS — N201 Calculus of ureter: Secondary | ICD-10-CM

## 2019-12-13 DIAGNOSIS — N390 Urinary tract infection, site not specified: Secondary | ICD-10-CM

## 2019-12-13 LAB — CBC WITH DIFFERENTIAL/PLATELET
Abs Immature Granulocytes: 0.01 10*3/uL (ref 0.00–0.07)
Basophils Absolute: 0 10*3/uL (ref 0.0–0.1)
Basophils Relative: 1 %
Eosinophils Absolute: 0.1 10*3/uL (ref 0.0–0.5)
Eosinophils Relative: 3 %
HCT: 40.5 % (ref 39.0–52.0)
Hemoglobin: 13.4 g/dL (ref 13.0–17.0)
Immature Granulocytes: 0 %
Lymphocytes Relative: 26 %
Lymphs Abs: 0.8 10*3/uL (ref 0.7–4.0)
MCH: 30.2 pg (ref 26.0–34.0)
MCHC: 33.1 g/dL (ref 30.0–36.0)
MCV: 91.4 fL (ref 80.0–100.0)
Monocytes Absolute: 0.5 10*3/uL (ref 0.1–1.0)
Monocytes Relative: 15 %
Neutro Abs: 1.8 10*3/uL (ref 1.7–7.7)
Neutrophils Relative %: 55 %
Platelets: 180 10*3/uL (ref 150–400)
RBC: 4.43 MIL/uL (ref 4.22–5.81)
RDW: 12.8 % (ref 11.5–15.5)
WBC: 3.2 10*3/uL — ABNORMAL LOW (ref 4.0–10.5)
nRBC: 0 % (ref 0.0–0.2)

## 2019-12-13 LAB — BASIC METABOLIC PANEL
Anion gap: 6 (ref 5–15)
BUN: 19 mg/dL (ref 8–23)
CO2: 27 mmol/L (ref 22–32)
Calcium: 8.7 mg/dL — ABNORMAL LOW (ref 8.9–10.3)
Chloride: 109 mmol/L (ref 98–111)
Creatinine, Ser: 1.12 mg/dL (ref 0.61–1.24)
GFR calc Af Amer: >60 mL/min (ref >60)
GFR calc non Af Amer: >60 mL/min (ref >60)
Glucose, Bld: 97 mg/dL (ref 70–99)
Potassium: 4.3 mmol/L (ref 3.5–5.1)
Sodium: 142 mmol/L (ref 135–145)

## 2019-12-13 LAB — MAGNESIUM: Magnesium: 2.1 mg/dL (ref 1.7–2.4)

## 2019-12-13 MED ORDER — SODIUM CHLORIDE 0.9 % IV SOLN
2.0000 g | Freq: Four times a day (QID) | INTRAVENOUS | Status: DC
  Filled 2019-12-13 (×3): qty 2000

## 2019-12-13 MED ORDER — AMOXICILLIN-POT CLAVULANATE 875-125 MG PO TABS: 1.0000 | ORAL_TABLET | Freq: Two times a day (BID) | ORAL | 0 refills | Status: AC

## 2019-12-13 NOTE — Discharge Summary (Signed)
Physician Discharge Summary  Patient ID: Timothy Eaton MRN: [id number] DOB/AGE: 1941/06/16 79 y.o.  Admit date: 12/11/2019 Discharge date: 12/13/2019  Admission Diagnoses: Acute pyelonephritis Discharge Diagnoses:  Principal Problem:   Acute pyelonephritis Active Problems:   Hyperlipidemia   Essential hypertension   COPD (chronic obstructive pulmonary disease) with emphysema (HCC)   Coronary artery disease   Atrial fibrillation (HCC)   BPH with obstruction/lower urinary tract symptoms   Acute renal failure superimposed on stage 3a chronic kidney disease ([place])   Pyelonephritis   Discharged Condition: good  Hospital Course:  Timothy Eaton a 79 y.o.malewith medical history significant ofhypertension, hyperlipidemia, COPD, CAD, atrial fibrillation not on anticoagulants, AAA, BPH, CKD stage III, GERD, who presents with left flank pain.  He also had a fever at home.  Is a recent urine culture grow Enterococcus faecalis.  The scan showed 7 mm obstructing calculus in the proximal third of the left ureter resulting in hydroureteronephrosis.  He is placed on Unasyn.  He was also brought to surgery on 5/8 by Dr. Gloriann Loan had a ureteral stent.  #1.  Acute pyelonephritis secondary to obstructing kidney stone, due to Enterococcus faecalis infection Post ureteral stent.  Patient received Unasyn and IV fluids. I discussed with Dr. Gloriann Loan, patient will need 2 weeks antibiotics with Augmentin.  We will follow up in his office to remove the stent in the future.    #2.  Acute kidney injury.  Secondary to obstructing kidney stone. Renal function has normalized.  3.  Essential hypertension. Continues on home medicines.  4.  COPD. No exacerbation.  5.  Atrial fibrillation.  Not chronically on anticoagulation.  Continue rate control.  Consults: urology  Significant Diagnostic Studies:  CT ABDOMEN AND PELVIS WITHOUT CONTRAST  TECHNIQUE: Multidetector CT imaging of the abdomen and pelvis was  performed following the standard protocol without IV contrast.  COMPARISON:  CT the abdomen and pelvis 03/06/2004.  FINDINGS: Lower chest: Moderate to severe emphysema. 8 mm nodule in the left lower lobe (axial image 10 of series 7), similar to prior examination from 2005, considered benign. Atherosclerotic calcifications in the descending thoracic aorta as well as the left circumflex and right coronary arteries.  Hepatobiliary: 8 mm low-attenuation lesion in segment 2 of the liver, incompletely characterized on today's non-contrast CT examination, but statistically likely to represent a tiny cyst. No other larger more suspicious appearing hepatic lesions are confidently identified on today's noncontrast CT examination. Status post cholecystectomy.  Pancreas: No definite pancreatic mass or peripancreatic fluid collections or inflammatory changes confidently identified on today's noncontrast CT examination.  Spleen: Unremarkable.  Adrenals/Urinary Tract: 7 mm calculus in the proximal third of the left ureter just distal to the left ureteropelvic junction, with mild to moderate proximal left hydroureteronephrosis. Moderate amount of left perinephric and periureteric soft tissue stranding. Additional nonobstructive calculi are noted within the collecting systems of both kidneys measuring up to 4 mm in the interpolar collecting system of the right kidney. No additional calculi are noted elsewhere along the course of either ureter or within the lumen of the urinary bladder. Multiple renal lesions bilaterally ranging from low to high attenuation, incompletely characterized on today's non-contrast CT examination, but likely to represent a combination of cysts and hemorrhagic/proteinaceous cysts. These measure up to 2.3 cm in diameter in the upper pole the left kidney. Urinary bladder is normal in appearance. Bilateral adrenal glands are normal in appearance.  Stomach/Bowel:  Unenhanced appearance of the stomach is normal. Large duodenal diverticulum extending off  the posterior aspect of the second portion of the duodenum, without surrounding inflammatory changes. No pathologic dilatation of small bowel or colon. Numerous colonic diverticulae are noted, without definite surrounding inflammatory changes to suggest an acute diverticulitis at this time. Normal appendix.  Vascular/Lymphatic: Aortic atherosclerosis. No lymphadenopathy noted in the abdomen or pelvis.  Reproductive: Prostate gland and seminal vesicles are unremarkable in appearance.  Other: No significant volume of ascites.  No pneumoperitoneum.  Musculoskeletal: There are no aggressive appearing lytic or blastic lesions noted in the visualized portions of the skeleton.  IMPRESSION: 1. 7 mm calculus in the proximal third of the left ureter just distal to the left ureteropelvic junction with mild to moderate proximal left hydroureteronephrosis indicative of obstruction. 2. Multiple additional nonobstructive calculi are noted within the collecting systems of both kidneys measuring up to 4 mm in the interpolar collecting system of the right kidney. 3. Numerous low, intermediate and high attenuation lesions in the kidneys bilaterally, incompletely characterized on today's noncontrast CT examination. These are statistically likely to represent cysts and proteinaceous/hemorrhagic cysts, and could be definitively characterized with nonemergent MRI of the abdomen with and without IV gadolinium if of clinical concern. 4. Severe colonic diverticulosis. 5. Aortic atherosclerosis, in addition to least 2 vessel coronary artery disease. 6. Moderate to severe emphysema. 7. Additional incidental findings, as above.   Electronically Signed   By: Vinnie Langton M.D.   On: 12/11/2019 15:23    Treatments: Cystoscopy with ureteral stent placement Antibiotics with Unasyn.  Discharge  Exam: Blood pressure 131/79, pulse 73, temperature (!) 97.5 F (36.4 C), temperature source Oral, resp. rate 20, height 5\' 9"  (1.753 m), weight 73.9 kg, SpO2 96 %. General appearance: alert and cooperative Resp: clear to auscultation bilaterally Cardio: regular rate and rhythm, S1, S2 normal, no murmur, click, rub or gallop GI: soft, non-tender; bowel sounds normal; no masses,  no organomegaly Extremities: extremities normal, atraumatic, no cyanosis or edema  Disposition: Discharge disposition: 01-Home or Self Care       Discharge Instructions    Diet - low sodium heart healthy   Complete by: As directed    Increase activity slowly   Complete by: As directed      Allergies as of 12/13/2019      Reactions   Sulfamethoxazole-trimethoprim    REACTION: rash      Medication List    STOP taking these medications   cefUROXime 500 MG tablet Commonly known as: CEFTIN     TAKE these medications   amoxicillin-clavulanate 875-125 MG tablet Commonly known as: Augmentin Take 1 tablet by mouth 2 (two) times daily for 14 days.   aspirin 81 MG EC tablet Take 81 mg by mouth daily.   diltiazem 240 MG 24 hr capsule Commonly known as: CARDIZEM CD TAKE 1 CAPSULE BY MOUTH EVERY DAY What changed: how much to take   metoprolol tartrate 25 MG tablet Commonly known as: LOPRESSOR TAKE 1 TABLET BY MOUTH TWICE DAILY. PLEASE KEEP APPOINTMENT IN JULY WITH DOCTOR NAHSER BFOR FUTURE REFILLS What changed: See the new instructions.   rosuvastatin 20 MG tablet Commonly known as: CRESTOR TAKE 1 TABLET BY MOUTH ONCE DAILY   tamsulosin 0.4 MG Caps capsule Commonly known as: FLOMAX Take 1 capsule (0.4 mg total) by mouth daily.      Follow-up Information    Viviana Simpler I, MD Follow up in 1 week(s).   Specialties: Internal Medicine, Pediatrics Contact information: [address] [place] [address] [phone]  Nahser, Wonda Cheng, MD .   Specialty: Cardiology Contact  information: [address] [place] [place] [address] [phone]        Marton Redwood III, MD Follow up in 1 week(s).   Specialty: Urology Contact information: [place] [address] [phone]           Signed: Sharen Hones 12/13/2019, 11:02 AM

## 2019-12-13 NOTE — Progress Notes (Signed)
PHARMACY NOTE:  ANTIMICROBIAL RENAL DOSAGE ADJUSTMENT  Current antimicrobial regimen includes a mismatch between antimicrobial dosage and estimated renal function.  As per policy approved by the Pharmacy & Therapeutics and Medical Executive Committees, the antimicrobial dosage will be adjusted accordingly.  Current antimicrobial dosage:  Ampicillin 2gm IV Q8H  Indication: enterococcus pyelonephritis   Renal Function: renal function improving, > 27ml/min will adjust antibiotic orders  Estimated Creatinine Clearance: 52.6 mL/min (by C-G formula based on SCr of 1.12 mg/dL). []      On intermittent HD, scheduled: []      On CRRT    Antimicrobial dosage has been changed to:  Ampicillin 2gm IV Q6H  Additional comments:   Thank you for allowing pharmacy to be a part of this patient's care.  Cheri Guppy, [facility] 12/13/2019 10:58 AM

## 2019-12-13 NOTE — Plan of Care (Signed)
The patient has been discharged. IV has been removed. No falls. Education performed.  Problem: Education: Goal: Knowledge of General Education information will improve Description: Including pain rating scale, medication(s)/side effects and non-pharmacologic comfort measures Outcome: Completed/Met   Problem: Health Behavior/Discharge Planning: Goal: Ability to manage health-related needs will improve Outcome: Completed/Met   Problem: Clinical Measurements: Goal: Ability to maintain clinical measurements within normal limits will improve Outcome: Completed/Met Goal: Will remain free from infection Outcome: Completed/Met Goal: Diagnostic test results will improve Outcome: Completed/Met Goal: Respiratory complications will improve Outcome: Completed/Met Goal: Cardiovascular complication will be avoided Outcome: Completed/Met   Problem: Activity: Goal: Risk for activity intolerance will decrease Outcome: Completed/Met   Problem: Nutrition: Goal: Adequate nutrition will be maintained Outcome: Completed/Met   Problem: Coping: Goal: Level of anxiety will decrease Outcome: Completed/Met   Problem: Elimination: Goal: Will not experience complications related to bowel motility Outcome: Completed/Met Goal: Will not experience complications related to urinary retention Outcome: Completed/Met   Problem: Pain Managment: Goal: General experience of comfort will improve Outcome: Completed/Met   Problem: Safety: Goal: Ability to remain free from injury will improve Outcome: Completed/Met   Problem: Skin Integrity: Goal: Risk for impaired skin integrity will decrease Outcome: Completed/Met

## 2019-12-13 NOTE — Progress Notes (Signed)
Urology Inpatient Progress Report  Pyelonephritis [N12] AKI (acute kidney injury) ([place]) [N17.9] Acute pyelonephritis [N10]  Procedure(s): CYSTOSCOPY WITH STENT PLACEMENT  1 Day Post-Op   Intv/Subj: No acute events overnight. Patient is without complaint.  Principal Problem:   Acute pyelonephritis Active Problems:   Hyperlipidemia   Essential hypertension   COPD (chronic obstructive pulmonary disease) with emphysema (HCC)   Coronary artery disease   Atrial fibrillation (HCC)   BPH with obstruction/lower urinary tract symptoms   Acute renal failure superimposed on stage 3a chronic kidney disease (HCC)   Pyelonephritis  Current Facility-Administered Medications  Medication Dose Route Frequency Provider Last Rate Last Admin  . 0.9 %  sodium chloride infusion   Intravenous Continuous Ivor Costa, MD 75 mL/hr at 12/13/19 0040 New Bag at 12/13/19 0040  . acetaminophen (TYLENOL) tablet 650 mg  650 mg Oral Q6H PRN Ivor Costa, MD      . albuterol (PROVENTIL) (2.5 MG/3ML) 0.083% nebulizer solution 2.5 mg  2.5 mg Inhalation Q4H PRN Ivor Costa, MD      . ampicillin (OMNIPEN) 2 g in sodium chloride 0.9 % 100 mL IVPB  2 g Intravenous Q6H Barefoot, Jody C, RPH      . aspirin EC tablet 81 mg  81 mg Oral Daily Ivor Costa, MD   81 mg at 12/13/19 1001  . diltiazem (CARDIZEM CD) 24 hr capsule 240 mg  240 mg Oral Daily Ivor Costa, MD   240 mg at 12/13/19 1001  . feeding supplement (ENSURE ENLIVE) (ENSURE ENLIVE) liquid 237 mL  237 mL Oral TID BM Ivor Costa, MD   237 mL at 12/13/19 1001  . heparin injection 5,000 Units  5,000 Units Subcutaneous Cleophas Dunker, MD   5,000 Units at 12/13/19 0556  . hydrALAZINE (APRESOLINE) injection 5 mg  5 mg Intravenous Q2H PRN Ivor Costa, MD      . metoprolol tartrate (LOPRESSOR) tablet 25 mg  25 mg Oral BID Ivor Costa, MD   25 mg at 12/13/19 1000  . multivitamin with minerals tablet 1 tablet  1 tablet Oral Daily Ivor Costa, MD   1 tablet at 12/13/19 1000  .  ondansetron (ZOFRAN) injection 4 mg  4 mg Intravenous Q8H PRN Ivor Costa, MD      . oxyCODONE-acetaminophen (PERCOCET/ROXICET) 5-325 MG per tablet 1 tablet  1 tablet Oral Q4H PRN Ivor Costa, MD      . rosuvastatin (CRESTOR) tablet 20 mg  20 mg Oral Daily Ivor Costa, MD   20 mg at 12/13/19 1000  . tamsulosin (FLOMAX) capsule 0.4 mg  0.4 mg Oral Daily Ivor Costa, MD   0.4 mg at 12/13/19 1001     Objective: Vital: Vitals:   12/12/19 1258 12/12/19 2008 12/13/19 0418 12/13/19 1000  BP: 124/70 136/81 129/79 131/79  Pulse: 69 65 62 73  Resp: 17 (!) 21 20   Temp: 98.1 F (36.7 C) 98.2 F (36.8 C) (!) 97.5 F (36.4 C)   TempSrc:  Oral Oral   SpO2: 94% 97% 96%   Weight:      Height:       I/Os: I/O last 3 completed shifts: In: 2675.9 [P.O.:360; I.V.:2076.1; IV Piggyback:239.9] Out: 725 [Urine:725]  Physical Exam:  General: Patient is in no apparent distress Lungs: Normal respiratory effort, chest expands symmetrically. GI: The abdomen is soft and nontender without mass. Ext: lower extremities symmetric  Lab Results: Recent Labs    12/11/19 1002 12/12/19 0537 12/13/19 0618  WBC 6.4 4.0 3.2*  HGB 15.7 13.7 13.4  HCT 46.5 39.1 40.5   Recent Labs    12/11/19 1002 12/12/19 0537 12/13/19 0618  NA 136 138 142  K 4.2 4.2 4.3  CL 102 106 109  CO2 26 26 27   GLUCOSE 133* 90 97  BUN 30* 29* 19  CREATININE 1.98* 1.80* 1.12  CALCIUM 9.1 8.3* 8.7*   Recent Labs    12/11/19 1601  INR 1.1   No results for input(s): LABURIN in the last 72 hours. Results for orders placed or performed during the hospital encounter of 12/11/19  Urine culture     Status: None   Collection Time: 12/11/19 12:13 PM   Specimen: Urine, Random  Result Value Ref Range Status   Specimen Description   Final    URINE, RANDOM Performed at [facility], [address], [place], [place] [address]    Special Requests   Final    NONE Performed at [facility], [address]  [place], [place] [address]    Culture   Final    NO GROWTH Performed at [facility], [place] [address], [place], [place] [address]    Report Status 12/12/2019 FINAL  Final  SARS CORONAVIRUS 2 (TAT 6-24 HRS) Nasopharyngeal Nasopharyngeal Swab     Status: None   Collection Time: 12/11/19 12:43 PM   Specimen: Nasopharyngeal Swab  Result Value Ref Range Status   SARS Coronavirus 2 NEGATIVE NEGATIVE Final    Comment: (NOTE) SARS-CoV-2 target nucleic acids are NOT DETECTED. The SARS-CoV-2 RNA is generally detectable in upper and lower respiratory specimens during the acute phase of infection. Negative results do not preclude SARS-CoV-2 infection, do not rule out co-infections with other pathogens, and should not be used as the sole basis for treatment or other patient management decisions. Negative results must be combined with clinical observations, patient history, and epidemiological information. The expected result is Negative. Fact Sheet for Patients: SugarRoll.be Fact Sheet for Healthcare Providers: https://www.woods-mathews.com/ This test is not yet approved or cleared by the [place] FDA and  has been authorized for detection and/or diagnosis of SARS-CoV-2 by FDA under an Emergency Use Authorization (EUA). This EUA will remain  in effect (meaning this test can be used) for the duration of the COVID-19 declaration under Section 56 4(b)(1) of the Act, 21 U.S.C. section 360bbb-3(b)(1), unless the authorization is terminated or revoked sooner. Performed at [facility], [place] [address], [place], [place] [address]   CULTURE, BLOOD (ROUTINE X 2) w Reflex to ID Panel     Status: None (Preliminary result)   Collection Time: 12/11/19  1:04 PM   Specimen: BLOOD  Result Value Ref Range Status   Specimen Description BLOOD RIGHT ANTECUBITAL  Final   Special Requests   Final    BOTTLES DRAWN AEROBIC AND ANAEROBIC Blood Culture adequate  volume   Culture   Final    NO GROWTH 2 DAYS Performed at [facility], [address], [place], [place] [address]    Report Status PENDING  Incomplete  CULTURE, BLOOD (ROUTINE X 2) w Reflex to ID Panel     Status: None (Preliminary result)   Collection Time: 12/11/19  1:04 PM   Specimen: BLOOD  Result Value Ref Range Status   Specimen Description BLOOD BLOOD RIGHT ARM  Final   Special Requests   Final    BOTTLES DRAWN AEROBIC AND ANAEROBIC Blood Culture adequate volume   Culture   Final    NO  GROWTH 2 DAYS Performed at [facility], [address] [place], [place] [address]    Report Status PENDING  Incomplete    Studies/Results: DG OR UROLOGY CYSTO IMAGE ([place])  Result Date: 12/12/2019 There is no interpretation for this exam.  This order is for images obtained during a surgical procedure.  Please See "Surgeries" Tab for more information regarding the procedure.   CT RENAL STONE STUDY  Result Date: 12/11/2019 CLINICAL DATA:  79 year old male with history of left-sided flank pain for the past several days. EXAM: CT ABDOMEN AND PELVIS WITHOUT CONTRAST TECHNIQUE: Multidetector CT imaging of the abdomen and pelvis was performed following the standard protocol without IV contrast. COMPARISON:  CT the abdomen and pelvis 03/06/2004. FINDINGS: Lower chest: Moderate to severe emphysema. 8 mm nodule in the left lower lobe (axial image 10 of series 7), similar to prior examination from 2005, considered benign. Atherosclerotic calcifications in the descending thoracic aorta as well as the left circumflex and right coronary arteries. Hepatobiliary: 8 mm low-attenuation lesion in segment 2 of the liver, incompletely characterized on today's non-contrast CT examination, but statistically likely to represent a tiny cyst. No other larger more suspicious appearing hepatic lesions are confidently identified on today's noncontrast CT examination. Status post cholecystectomy.  Pancreas: No definite pancreatic mass or peripancreatic fluid collections or inflammatory changes confidently identified on today's noncontrast CT examination. Spleen: Unremarkable. Adrenals/Urinary Tract: 7 mm calculus in the proximal third of the left ureter just distal to the left ureteropelvic junction, with mild to moderate proximal left hydroureteronephrosis. Moderate amount of left perinephric and periureteric soft tissue stranding. Additional nonobstructive calculi are noted within the collecting systems of both kidneys measuring up to 4 mm in the interpolar collecting system of the right kidney. No additional calculi are noted elsewhere along the course of either ureter or within the lumen of the urinary bladder. Multiple renal lesions bilaterally ranging from low to high attenuation, incompletely characterized on today's non-contrast CT examination, but likely to represent a combination of cysts and hemorrhagic/proteinaceous cysts. These measure up to 2.3 cm in diameter in the upper pole the left kidney. Urinary bladder is normal in appearance. Bilateral adrenal glands are normal in appearance. Stomach/Bowel: Unenhanced appearance of the stomach is normal. Large duodenal diverticulum extending off the posterior aspect of the second portion of the duodenum, without surrounding inflammatory changes. No pathologic dilatation of small bowel or colon. Numerous colonic diverticulae are noted, without definite surrounding inflammatory changes to suggest an acute diverticulitis at this time. Normal appendix. Vascular/Lymphatic: Aortic atherosclerosis. No lymphadenopathy noted in the abdomen or pelvis. Reproductive: Prostate gland and seminal vesicles are unremarkable in appearance. Other: No significant volume of ascites.  No pneumoperitoneum. Musculoskeletal: There are no aggressive appearing lytic or blastic lesions noted in the visualized portions of the skeleton. IMPRESSION: 1. 7 mm calculus in the proximal  third of the left ureter just distal to the left ureteropelvic junction with mild to moderate proximal left hydroureteronephrosis indicative of obstruction. 2. Multiple additional nonobstructive calculi are noted within the collecting systems of both kidneys measuring up to 4 mm in the interpolar collecting system of the right kidney. 3. Numerous low, intermediate and high attenuation lesions in the kidneys bilaterally, incompletely characterized on today's noncontrast CT examination. These are statistically likely to represent cysts and proteinaceous/hemorrhagic cysts, and could be definitively characterized with nonemergent MRI of the abdomen with and without IV gadolinium if of clinical concern. 4. Severe colonic diverticulosis. 5. Aortic atherosclerosis, in addition to least  2 vessel coronary artery disease. 6. Moderate to severe emphysema. 7. Additional incidental findings, as above. Electronically Signed   By: Vinnie Langton M.D.   On: 12/11/2019 15:23    Assessment: Left ureteral calculus Urinary tract infection  Procedure(s): CYSTOSCOPY WITH STENT PLACEMENT, 1 Day Post-Op  doing well.  Plan: Patient is stable from a urological standpoint.  Recommend total of 2 weeks antibiotics and we will have him follow-up in about a week   Link Snuffer, MD Urology 12/13/2019, 12:07 PM

## 2019-12-14 ENCOUNTER — Telehealth: Payer: Self-pay | Admitting: Urology

## 2019-12-14 ENCOUNTER — Telehealth: Payer: Self-pay

## 2019-12-14 NOTE — Telephone Encounter (Signed)
App made and patient is aware ° °Timothy Eaton °

## 2019-12-14 NOTE — Telephone Encounter (Signed)
-----   Message from Lucas Mallow, MD sent at 12/12/2019 10:04 AM EDT ----- Please arrange for follow-up with a provider in 1 week to discuss ureteroscopy and to obtain preoperative urine culture.  Thank you

## 2019-12-14 NOTE — Telephone Encounter (Signed)
Noted I will review his discharge summary. I presume he will have urology follow up fairly soon

## 2019-12-14 NOTE — Telephone Encounter (Signed)
1st attempt- Called patient to complete TCM and schedule hospital follow up visit and received no answer. Left HIPAA complaint message asking patient to return my call.

## 2019-12-14 NOTE — Telephone Encounter (Signed)
Transition Care Management Follow-up Telephone Call  Date of discharge and from where: 12/13/2019, [facility]  How have you been since you were released from the hospital? Patient states that he is doing okay.  Any questions or concerns? No   Items Reviewed:  Did the pt receive and understand the discharge instructions provided? Yes   Medications obtained and verified? Yes   Any new allergies since your discharge? No   Dietary orders reviewed? Yes  Do you have support at home? Yes   Functional Questionnaire: (I = Independent and D = Dependent) ADLs: I  Bathing/Dressing- I  Meal Prep- I  Eating- I  Maintaining continence- I  Transferring/Ambulation- i  Managing Meds- I  Follow up appointments reviewed:   PCP Hospital f/u appt confirmed? Yes  Scheduled to see Dr. Silvio Pate on 01/06/2020 Patient did not want to come in sooner.  Are transportation arrangements needed? No   If their condition worsens, is the pt aware to call PCP or go to the Emergency Dept.? Yes  Was the patient provided with contact information for the PCP's office or ED? Yes  Was to pt encouraged to call back with questions or concerns? Yes

## 2019-12-16 LAB — CULTURE, BLOOD (ROUTINE X 2)
Culture: NO GROWTH
Culture: NO GROWTH
Special Requests: ADEQUATE
Special Requests: ADEQUATE

## 2019-12-16 NOTE — Progress Notes (Signed)
12/17/19 10:34 PM   Timothy Eaton 1940/12/12 [id number]  Referring provider: [name], MD [name],  [place] [address] No chief complaint on file.   HPI: Timothy Eaton is a 79 y.o. M who returns today for the evaluation and management of nephrolithiasis.    -CT Renal stone study from 12/11/19 revealed 7 mm calculus in the proximal third of the left ureter just distal to the left ureteropelvic junction with mild to moderate proximal left hydroureteronephrosis indicative of obstruction associated with history fever and pyuria. -Multiple additional nonobstructive calculi noted within collecting systems of both kidneys measuring up to 4 mm  -Urgent inpatient stent placement by Dr. Gloriann Loan on 12/12/19  -No complaints today -Tolerating stent w/o problems  -Urine culture at time of hospitalization negative however positive Enterococcus faecalis 2 days prior to admission.  PMH: Past Medical History:  Diagnosis Date  . AAA (abdominal aortic aneurysm) ([facility])    [name], Repaired, 2005  . Atrial fibrillation (HCC)    Postoperative in past  . Carotid artery disease ([place])    Minimal, doppler 2004, 0-39% bilateral, doppler 05/17/09 0-39% bilateral  . COPD (chronic obstructive pulmonary disease) ([place])   . Coronary artery disease    Moderate, catheterization 2005, nuclear scan 9/09 no ischemia, EF 60%, echo  . ED (erectile dysfunction)   . Ejection fraction    EF 60% echo, September, 2009  . GERD (gastroesophageal reflux disease)   . Hemorrhoids   . Hyperlipidemia   . Hypertension   . Lung nodule    Followup by pulmonary in past  . Palpitations    History palpitations in the past  . Palpitations    January, 2013  . Shortness of breath     Surgical History: Past Surgical History:  Procedure Laterality Date  . ABDOMINAL AORTIC ANEURYSM REPAIR  04/06/04   Dr. [name]  . BACK SURGERY    . CHOLECYSTECTOMY  11/26/02  . CYSTOSCOPY WITH STENT PLACEMENT Left 12/12/2019    Procedure: CYSTOSCOPY WITH STENT PLACEMENT;  Surgeon: Lucas Mallow, MD;  Location: [facility] ORS;  Service: Urology;  Laterality: Left;  . PILONIDAL CYST EXCISION  11/11    Home Medications:  Allergies as of 12/17/2019      Reactions   Sulfamethoxazole-trimethoprim    REACTION: rash      Medication List       Accurate as of Dec 16, 2019 10:34 PM. If you have any questions, ask your nurse or doctor.        amoxicillin-clavulanate 875-125 MG tablet Commonly known as: Augmentin Take 1 tablet by mouth 2 (two) times daily for 14 days.   aspirin 81 MG EC tablet Take 81 mg by mouth daily.   diltiazem 240 MG 24 hr capsule Commonly known as: CARDIZEM CD TAKE 1 CAPSULE BY MOUTH EVERY DAY What changed: how much to take   metoprolol tartrate 25 MG tablet Commonly known as: LOPRESSOR TAKE 1 TABLET BY MOUTH TWICE DAILY. PLEASE KEEP APPOINTMENT IN JULY WITH DOCTOR NAHSER BFOR FUTURE REFILLS What changed: See the new instructions.   rosuvastatin 20 MG tablet Commonly known as: CRESTOR TAKE 1 TABLET BY MOUTH ONCE DAILY   tamsulosin 0.4 MG Caps capsule Commonly known as: FLOMAX Take 1 capsule (0.4 mg total) by mouth daily.       Allergies:  Allergies  Allergen Reactions  . Sulfamethoxazole-Trimethoprim     REACTION: rash    Family History: Family History  Problem Relation Age  of Onset  . Hypertension Mother        ?  . Stroke Mother        CVA  . Diabetes Mother        DM  . Heart attack Father        MI  . Cancer Sister        Gall bladder   . Stroke Other        All mom's family died of strokes    Social History:  reports that he quit smoking about 17 years ago. His smokeless tobacco use includes chew. He reports that he does not drink alcohol or use drugs.   Physical Exam: There were no vitals taken for this visit.  Constitutional:  Alert and oriented, No acute distress. HEENT: [place] AT, moist mucus membranes.  Trachea midline, no masses. Cardiovascular:  No clubbing, cyanosis, or edema.  CV RRR Respiratory: Normal respiratory effort, no increased work of breathing.  Lungs clear Skin: No rashes, bruises or suspicious lesions. Neurologic: Grossly intact, no focal deficits, moving all 4 extremities. Psychiatric: Normal mood and affect.  Laboratory Data:  Lab Results  Component Value Date   CREATININE 1.12 12/13/2019     Pertinent Imaging:  Results for orders placed during the hospital encounter of 12/11/19  CT RENAL STONE STUDY   Narrative CLINICAL DATA:  79 year old male with history of left-sided flank pain for the past several days.  EXAM: CT ABDOMEN AND PELVIS WITHOUT CONTRAST  TECHNIQUE: Multidetector CT imaging of the abdomen and pelvis was performed following the standard protocol without IV contrast.  COMPARISON:  CT the abdomen and pelvis 03/06/2004.  FINDINGS: Lower chest: Moderate to severe emphysema. 8 mm nodule in the left lower lobe (axial image 10 of series 7), similar to prior examination from 2005, considered benign. Atherosclerotic calcifications in the descending thoracic aorta as well as the left circumflex and right coronary arteries.  Hepatobiliary: 8 mm low-attenuation lesion in segment 2 of the liver, incompletely characterized on today's non-contrast CT examination, but statistically likely to represent a tiny cyst. No other larger more suspicious appearing hepatic lesions are confidently identified on today's noncontrast CT examination. Status post cholecystectomy.  Pancreas: No definite pancreatic mass or peripancreatic fluid collections or inflammatory changes confidently identified on today's noncontrast CT examination.  Spleen: Unremarkable.  Adrenals/Urinary Tract: 7 mm calculus in the proximal third of the left ureter just distal to the left ureteropelvic junction, with mild to moderate proximal left hydroureteronephrosis. Moderate amount of left perinephric and periureteric soft  tissue stranding. Additional nonobstructive calculi are noted within the collecting systems of both kidneys measuring up to 4 mm in the interpolar collecting system of the right kidney. No additional calculi are noted elsewhere along the course of either ureter or within the lumen of the urinary bladder. Multiple renal lesions bilaterally ranging from low to high attenuation, incompletely characterized on today's non-contrast CT examination, but likely to represent a combination of cysts and hemorrhagic/proteinaceous cysts. These measure up to 2.3 cm in diameter in the upper pole the left kidney. Urinary bladder is normal in appearance. Bilateral adrenal glands are normal in appearance.  Stomach/Bowel: Unenhanced appearance of the stomach is normal. Large duodenal diverticulum extending off the posterior aspect of the second portion of the duodenum, without surrounding inflammatory changes. No pathologic dilatation of small bowel or colon. Numerous colonic diverticulae are noted, without definite surrounding inflammatory changes to suggest an acute diverticulitis at this time. Normal appendix.  Vascular/Lymphatic: Aortic atherosclerosis. No  lymphadenopathy noted in the abdomen or pelvis.  Reproductive: Prostate gland and seminal vesicles are unremarkable in appearance.  Other: No significant volume of ascites.  No pneumoperitoneum.  Musculoskeletal: There are no aggressive appearing lytic or blastic lesions noted in the visualized portions of the skeleton.  IMPRESSION: 1. 7 mm calculus in the proximal third of the left ureter just distal to the left ureteropelvic junction with mild to moderate proximal left hydroureteronephrosis indicative of obstruction. 2. Multiple additional nonobstructive calculi are noted within the collecting systems of both kidneys measuring up to 4 mm in the interpolar collecting system of the right kidney. 3. Numerous low, intermediate and high  attenuation lesions in the kidneys bilaterally, incompletely characterized on today's noncontrast CT examination. These are statistically likely to represent cysts and proteinaceous/hemorrhagic cysts, and could be definitively characterized with nonemergent MRI of the abdomen with and without IV gadolinium if of clinical concern. 4. Severe colonic diverticulosis. 5. Aortic atherosclerosis, in addition to least 2 vessel coronary artery disease. 6. Moderate to severe emphysema. 7. Additional incidental findings, as above.   Electronically Signed   By: Vinnie Langton M.D.   On: 12/11/2019 15:23     Assessment & Plan:    1. Ureteral calculus  Left proximal ureteral calculus status post stent placement for infection.  He also has nonobstructing left renal calculi.  We discussed various treatment options for urolithiasis including observation with or without medical expulsive therapy, shockwave lithotripsy (SWL), ureteroscopy and laser lithotripsy with stent placement.  We discussed that management is based on stone size, location, density, patient co-morbidities, and patient preference.   Stones <53mm in size have a >80% spontaneous passage rate. Data surrounding the use of tamsulosin for medical expulsive therapy is controversial, but meta analyses suggests it is most efficacious for distal stones between 5-66mm in size.  Unlikely he would be able to pass with an indwelling stent and a 7 mm calculus.  SWL has a lower stone free rate in a single procedure, but also a lower complication rate compared to ureteroscopy and avoids a stent and associated stent related symptoms. Possible complications include renal hematoma, steinstrasse, and need for additional treatment.  Ureteroscopy with laser lithotripsy and stent placement has a higher stone free rate than SWL in a single procedure, however increased complication rate including possible infection, ureteral injury, bleeding, and stent  related morbidity.   After an extensive discussion of the risks and benefits of the above treatment options, the patient would like to proceed with ureteroscopic stone removal.   [facility] [address], [place],  [address] [phone]  I, Lucas Mallow, am acting as a scribe for Dr. Nicki Reaper C. Ollivander See,  I have reviewed the above documentation for accuracy and completeness, and I agree with the above.   Abbie Sons, MD

## 2019-12-16 NOTE — H&P (View-Only) (Signed)
12/17/19 10:34 PM   Timothy Eaton [birth year] [id number]  Referring provider: [name], MD [place],  [place] [address] No chief complaint on file.   HPI: Timothy Eaton is a 79 y.o. M who returns today for the evaluation and management of nephrolithiasis.    -CT Renal stone study from 12/11/19 revealed 7 mm calculus in the proximal third of the left ureter just distal to the left ureteropelvic junction with mild to moderate proximal left hydroureteronephrosis indicative of obstruction associated with history fever and pyuria. -Multiple additional nonobstructive calculi noted within collecting systems of both kidneys measuring up to 4 mm  -Urgent inpatient stent placement by Dr. Gloriann Loan on 12/12/19  -No complaints today -Tolerating stent w/o problems  -Urine culture at time of hospitalization negative however positive Enterococcus faecalis 2 days prior to admission.  PMH: Past Medical History:  Diagnosis Date  . AAA (abdominal aortic aneurysm) ([place])    [name], Repaired, 2005  . Atrial fibrillation (HCC)    Postoperative in past  . Carotid artery disease ([place])    Minimal, doppler 2004, 0-39% bilateral, doppler 05/17/09 0-39% bilateral  . COPD (chronic obstructive pulmonary disease) ([place])   . Coronary artery disease    Moderate, catheterization 2005, nuclear scan 9/09 no ischemia, EF 60%, echo  . ED (erectile dysfunction)   . Ejection fraction    EF 60% echo, September, 2009  . GERD (gastroesophageal reflux disease)   . Hemorrhoids   . Hyperlipidemia   . Hypertension   . Lung nodule    Followup by pulmonary in past  . Palpitations    History palpitations in the past  . Palpitations    January, 2013  . Shortness of breath     Surgical History: Past Surgical History:  Procedure Laterality Date  . ABDOMINAL AORTIC ANEURYSM REPAIR  04/06/04   Dr. [name]  . BACK SURGERY    . CHOLECYSTECTOMY  11/26/02  . CYSTOSCOPY WITH STENT PLACEMENT Left 12/12/2019    Procedure: CYSTOSCOPY WITH STENT PLACEMENT;  Surgeon: Lucas Mallow, MD;  Location: [facility] ORS;  Service: Urology;  Laterality: Left;  . PILONIDAL CYST EXCISION  11/11    Home Medications:  Allergies as of 12/17/2019      Reactions   Sulfamethoxazole-trimethoprim    REACTION: rash      Medication List       Accurate as of Dec 16, 2019 10:34 PM. If you have any questions, ask your nurse or doctor.        amoxicillin-clavulanate 875-125 MG tablet Commonly known as: Augmentin Take 1 tablet by mouth 2 (two) times daily for 14 days.   aspirin 81 MG EC tablet Take 81 mg by mouth daily.   diltiazem 240 MG 24 hr capsule Commonly known as: CARDIZEM CD TAKE 1 CAPSULE BY MOUTH EVERY DAY What changed: how much to take   metoprolol tartrate 25 MG tablet Commonly known as: LOPRESSOR TAKE 1 TABLET BY MOUTH TWICE DAILY. PLEASE KEEP APPOINTMENT IN JULY WITH DOCTOR NAHSER BFOR FUTURE REFILLS What changed: See the new instructions.   rosuvastatin 20 MG tablet Commonly known as: CRESTOR TAKE 1 TABLET BY MOUTH ONCE DAILY   tamsulosin 0.4 MG Caps capsule Commonly known as: FLOMAX Take 1 capsule (0.4 mg total) by mouth daily.       Allergies:  Allergies  Allergen Reactions  . Sulfamethoxazole-Trimethoprim     REACTION: rash    Family History: Family History  Problem Relation Age  of Onset  . Hypertension Mother        ?  . Stroke Mother        CVA  . Diabetes Mother        DM  . Heart attack Father        MI  . Cancer Sister        Gall bladder   . Stroke Other        All mom's family died of strokes    Social History:  reports that he quit smoking about 17 years ago. His smokeless tobacco use includes chew. He reports that he does not drink alcohol or use drugs.   Physical Exam: There were no vitals taken for this visit.  Constitutional:  Alert and oriented, No acute distress. HEENT: [place] AT, moist mucus membranes.  Trachea midline, no masses. Cardiovascular:  No clubbing, cyanosis, or edema.  CV RRR Respiratory: Normal respiratory effort, no increased work of breathing.  Lungs clear Skin: No rashes, bruises or suspicious lesions. Neurologic: Grossly intact, no focal deficits, moving all 4 extremities. Psychiatric: Normal mood and affect.  Laboratory Data:  Lab Results  Component Value Date   CREATININE 1.12 12/13/2019     Pertinent Imaging:  Results for orders placed during the hospital encounter of 12/11/19  CT RENAL STONE STUDY   Narrative CLINICAL DATA:  79 year old male with history of left-sided flank pain for the past several days.  EXAM: CT ABDOMEN AND PELVIS WITHOUT CONTRAST  TECHNIQUE: Multidetector CT imaging of the abdomen and pelvis was performed following the standard protocol without IV contrast.  COMPARISON:  CT the abdomen and pelvis 03/06/2004.  FINDINGS: Lower chest: Moderate to severe emphysema. 8 mm nodule in the left lower lobe (axial image 10 of series 7), similar to prior examination from 2005, considered benign. Atherosclerotic calcifications in the descending thoracic aorta as well as the left circumflex and right coronary arteries.  Hepatobiliary: 8 mm low-attenuation lesion in segment 2 of the liver, incompletely characterized on today's non-contrast CT examination, but statistically likely to represent a tiny cyst. No other larger more suspicious appearing hepatic lesions are confidently identified on today's noncontrast CT examination. Status post cholecystectomy.  Pancreas: No definite pancreatic mass or peripancreatic fluid collections or inflammatory changes confidently identified on today's noncontrast CT examination.  Spleen: Unremarkable.  Adrenals/Urinary Tract: 7 mm calculus in the proximal third of the left ureter just distal to the left ureteropelvic junction, with mild to moderate proximal left hydroureteronephrosis. Moderate amount of left perinephric and periureteric soft  tissue stranding. Additional nonobstructive calculi are noted within the collecting systems of both kidneys measuring up to 4 mm in the interpolar collecting system of the right kidney. No additional calculi are noted elsewhere along the course of either ureter or within the lumen of the urinary bladder. Multiple renal lesions bilaterally ranging from low to high attenuation, incompletely characterized on today's non-contrast CT examination, but likely to represent a combination of cysts and hemorrhagic/proteinaceous cysts. These measure up to 2.3 cm in diameter in the upper pole the left kidney. Urinary bladder is normal in appearance. Bilateral adrenal glands are normal in appearance.  Stomach/Bowel: Unenhanced appearance of the stomach is normal. Large duodenal diverticulum extending off the posterior aspect of the second portion of the duodenum, without surrounding inflammatory changes. No pathologic dilatation of small bowel or colon. Numerous colonic diverticulae are noted, without definite surrounding inflammatory changes to suggest an acute diverticulitis at this time. Normal appendix.  Vascular/Lymphatic: Aortic atherosclerosis. No  lymphadenopathy noted in the abdomen or pelvis.  Reproductive: Prostate gland and seminal vesicles are unremarkable in appearance.  Other: No significant volume of ascites.  No pneumoperitoneum.  Musculoskeletal: There are no aggressive appearing lytic or blastic lesions noted in the visualized portions of the skeleton.  IMPRESSION: 1. 7 mm calculus in the proximal third of the left ureter just distal to the left ureteropelvic junction with mild to moderate proximal left hydroureteronephrosis indicative of obstruction. 2. Multiple additional nonobstructive calculi are noted within the collecting systems of both kidneys measuring up to 4 mm in the interpolar collecting system of the right kidney. 3. Numerous low, intermediate and high  attenuation lesions in the kidneys bilaterally, incompletely characterized on today's noncontrast CT examination. These are statistically likely to represent cysts and proteinaceous/hemorrhagic cysts, and could be definitively characterized with nonemergent MRI of the abdomen with and without IV gadolinium if of clinical concern. 4. Severe colonic diverticulosis. 5. Aortic atherosclerosis, in addition to least 2 vessel coronary artery disease. 6. Moderate to severe emphysema. 7. Additional incidental findings, as above.   Electronically Signed   By: Vinnie Langton M.D.   On: 12/11/2019 15:23     Assessment & Plan:    1. Ureteral calculus  Left proximal ureteral calculus status post stent placement for infection.  He also has nonobstructing left renal calculi.  We discussed various treatment options for urolithiasis including observation with or without medical expulsive therapy, shockwave lithotripsy (SWL), ureteroscopy and laser lithotripsy with stent placement.  We discussed that management is based on stone size, location, density, patient co-morbidities, and patient preference.   Stones <44mm in size have a >80% spontaneous passage rate. Data surrounding the use of tamsulosin for medical expulsive therapy is controversial, but meta analyses suggests it is most efficacious for distal stones between 5-62mm in size.  Unlikely he would be able to pass with an indwelling stent and a 7 mm calculus.  SWL has a lower stone free rate in a single procedure, but also a lower complication rate compared to ureteroscopy and avoids a stent and associated stent related symptoms. Possible complications include renal hematoma, steinstrasse, and need for additional treatment.  Ureteroscopy with laser lithotripsy and stent placement has a higher stone free rate than SWL in a single procedure, however increased complication rate including possible infection, ureteral injury, bleeding, and stent  related morbidity.   After an extensive discussion of the risks and benefits of the above treatment options, the patient would like to proceed with ureteroscopic stone removal.   [facility] [address] [place], [place] [address] [phone]  I, Lucas Mallow, am acting as a scribe for Dr. Nicki Reaper C. Edona Schreffler,  I have reviewed the above documentation for accuracy and completeness, and I agree with the above.   Abbie Sons, MD

## 2019-12-17 ENCOUNTER — Other Ambulatory Visit: Payer: Self-pay

## 2019-12-17 ENCOUNTER — Encounter: Payer: Self-pay | Admitting: Urology

## 2019-12-17 ENCOUNTER — Ambulatory Visit (INDEPENDENT_AMBULATORY_CARE_PROVIDER_SITE_OTHER): Payer: Medicare Other | Admitting: Urology

## 2019-12-17 VITALS — BP 132/72 | HR 51 | Ht 70.0 in | Wt 163.0 lb

## 2019-12-17 DIAGNOSIS — N201 Calculus of ureter: Secondary | ICD-10-CM | POA: Diagnosis not present

## 2019-12-17 DIAGNOSIS — N401 Enlarged prostate with lower urinary tract symptoms: Secondary | ICD-10-CM | POA: Diagnosis not present

## 2019-12-17 DIAGNOSIS — N200 Calculus of kidney: Secondary | ICD-10-CM

## 2019-12-17 DIAGNOSIS — N138 Other obstructive and reflux uropathy: Secondary | ICD-10-CM | POA: Diagnosis not present

## 2019-12-17 LAB — URINALYSIS, COMPLETE
Bilirubin, UA: NEGATIVE
Glucose, UA: NEGATIVE
Nitrite, UA: NEGATIVE
Specific Gravity, UA: >1.03 — ABNORMAL HIGH (ref 1.005–1.030)
Urobilinogen, Ur: 1 mg/dL (ref 0.2–1.0)
pH, UA: 5 (ref 5.0–7.5)

## 2019-12-17 LAB — MICROSCOPIC EXAMINATION: RBC, Urine: >30 /hpf — AB (ref 0–2)

## 2019-12-19 ENCOUNTER — Encounter: Payer: Self-pay | Admitting: Urology

## 2019-12-19 DIAGNOSIS — N200 Calculus of kidney: Secondary | ICD-10-CM | POA: Insufficient documentation

## 2019-12-19 LAB — URINE CULTURE: Organism ID, Bacteria: NO GROWTH

## 2019-12-22 ENCOUNTER — Telehealth: Payer: Self-pay | Admitting: Urology

## 2019-12-22 NOTE — Telephone Encounter (Signed)
Need orders please.

## 2019-12-22 NOTE — Telephone Encounter (Signed)
Pt wife Marlowe Kays left vm she states pt was seen on 12/17/19 and was under the understanding someone would call him to schedule a surgery and she has not heard anything please call pt

## 2019-12-23 ENCOUNTER — Other Ambulatory Visit: Payer: Self-pay | Admitting: Radiology

## 2019-12-23 ENCOUNTER — Telehealth: Payer: Self-pay | Admitting: *Deleted

## 2019-12-23 DIAGNOSIS — N201 Calculus of ureter: Secondary | ICD-10-CM

## 2019-12-23 DIAGNOSIS — N200 Calculus of kidney: Secondary | ICD-10-CM

## 2019-12-23 NOTE — Telephone Encounter (Signed)
   [facility] Pre-operative Risk Assessment    HEARTCARE STAFF: - Please ensure there is not already an duplicate clearance open for this procedure - Under Visit Info/Reason for Call, type in Other and utilize the format Clearance MM/DD/YY or Clearance TBD  Request for surgical clearance:  1. What type of surgery is being performed? LEFT URETEROSCOPY, LASER LITHOTRIPSY, STONE REMOVAL, URETERAL STENT EXCHANGE   2. When is this surgery scheduled? 01/05/20   3. What type of clearance is required (medical clearance vs. Pharmacy clearance to hold med vs. Both)? MEDICAL  4. Are there any medications that need to be held prior to surgery and how long? ASA x 7 DAYS PRIOR TO PROCEDURE   5. Practice name and name of physician performing surgery? [facility]; DR. Nicki Reaper STOIOFF   6. What is the office phone number? [phone]   7.   What is the office fax number?  [phone] ATTN: AMY  8.   Anesthesia type (None, local, MAC, general) ? NOT LISTED   Julaine Hua 12/23/2019, 12:09 PM  _________________________________________________________________   (provider comments below)

## 2019-12-23 NOTE — Telephone Encounter (Signed)
   Primary Cardiologist: [name], MD  Chart reviewed as part of pre-operative protocol coverage. Given past medical history and time since last visit, based on ACC/AHA guidelines, NADARIUS SUNDERLIN would be at acceptable risk for the planned procedure without further cardiovascular testing.   Spoke with Mr. Blackledge 12/23/19 at which time he had no anginal symptoms and can perform greater than 4 METS without difficulty. He has only mild CAD with no stent placement therefore it will be acceptable to hold ASA therapy for 5-7 days prior to procedure then resume when safe from a bleeding standpoint.   I will route this recommendation to the requesting party via Epic fax function and remove from pre-op pool.  Please call with questions.  Kathyrn Drown, NP 12/23/2019, 12:57 PM

## 2019-12-29 ENCOUNTER — Other Ambulatory Visit: Payer: Self-pay

## 2019-12-29 ENCOUNTER — Encounter
Admission: RE | Admit: 2019-12-29 | Discharge: 2019-12-29 | Disposition: A | Discharge status: Home / Self Care | Payer: Medicare Other | Source: Ambulatory Visit | Attending: Urology | Admitting: Urology

## 2019-12-29 HISTORY — DX: Family history of other specified conditions: Z84.89

## 2019-12-29 NOTE — Patient Instructions (Addendum)
Your procedure is scheduled on: 01-05-20 TUESDAY Report to Same Day Surgery 2nd floor medical mall [facility] Entrance-take elevator on left to 2nd floor.  Check in with surgery information desk.) To find out your arrival time please call [phone] between 1PM - 3PM on 01-01-20 FRIDAY  Remember: Instructions that are not followed completely may result in serious medical risk, up to and including death, or upon the discretion of your surgeon and anesthesiologist your surgery may need to be rescheduled.    _x___ 1. Do not eat food after midnight the night before your procedure. NO GUM OR CANDY AFTER MIDNIGHT. You may drink clear liquids up to 2 hours before you are scheduled to arrive at the hospital for your procedure.  Do not drink clear liquids within 2 hours of your scheduled arrival to the hospital.  Clear liquids include  --Water or Apple juice without pulp  --Gatorade  --Black Coffee or Clear Tea (No milk, no creamers, do not add anything to the coffee or Tea-ok to add sugar)   ____Ensure clear carbohydrate drink on the way to the hospital for bariatric patients  ____Ensure clear carbohydrate drink 3 hours before surgery.    __x__ 2. No Alcohol for 24 hours before or after surgery.   __x__3. No Smoking or e-cigarettes for 24 prior to surgery.  Do not use any chewable tobacco products for at least 6 hour prior to surgery   ____  4. Bring all medications with you on the day of surgery if instructed.    __x__ 5. Notify your doctor if there is any change in your medical condition     (cold, fever, infections).    x___6. On the morning of surgery brush your teeth with toothpaste and water.  You may rinse your mouth with mouth wash if you wish.  Do not swallow any toothpaste or mouthwash.   Do not wear jewelry, make-up, hairpins, clips or nail polish.  Do not wear lotions, powders, or perfumes. You may wear deodorant.  Do not shave 48 hours prior to surgery. Men may shave face  and neck.  Do not bring valuables to the hospital.    [facility] is not responsible for any belongings or valuables.               Contacts, dentures or bridgework may not be worn into surgery.  Leave your suitcase in the car. After surgery it may be brought to your room.  For patients admitted to the hospital, discharge time is determined by your treatment team.  _  Patients discharged the day of surgery will not be allowed to drive home.  You will need someone to drive you home and stay with you the night of your procedure.    Please read over the following fact sheets that you were given:   [facility] Preparing for Surgery   _x___ TAKE THE FOLLOWING MEDICATION THE MORNING OF SURGERY WITH A SMALL SIP OF WATER. These include:  1. METOPROLOL (LOPRESSOR)  2. DILTIAZEM (CARDIZEM)  3. CRESTOR (ROSUVASTATIN)  4.  5.  6.  ____Fleets enema or Magnesium Citrate as directed.   ____ Use CHG Soap or sage wipes as directed on instruction sheet   ____ Use inhalers on the day of surgery and bring to hospital day of surgery  ____ Stop Metformin and Janumet 2 days prior to surgery.    ____ Take 1/2 of usual insulin dose the night before surgery and none on the morning surgery.  _x___ Follow recommendations from Cardiologist, Pulmonologist or PCP regarding stopping Aspirin, Coumadin, Plavix ,Eliquis, Effient, or Pradaxa, and Pletal-INSTRUCTED BY DR STOIFF'S OFFICE TO STOP ASPIRIN 7 DAYS PRIOR TO SURGERY-LAST DOSE WAS 12-28-19 (Monday)  X____Stop Anti-inflammatories such as Advil, Aleve, Ibuprofen, Motrin, Naproxen, Naprosyn, Goodies powders or aspirin products NOW-OK to take Tylenol    ____ Stop supplements until after surgery.     ____ Bring C-Pap to the hospital.

## 2019-12-31 NOTE — Pre-Procedure Instructions (Signed)
CARDIAC CLEARANCE ON CHART 

## 2020-01-01 ENCOUNTER — Other Ambulatory Visit: Payer: Self-pay

## 2020-01-01 ENCOUNTER — Other Ambulatory Visit
Admission: RE | Admit: 2020-01-01 | Discharge: 2020-01-01 | Disposition: A | Discharge status: Home / Self Care | Payer: Medicare Other | Source: Ambulatory Visit | Attending: Urology | Admitting: Urology

## 2020-01-01 DIAGNOSIS — Z20822 Contact with and (suspected) exposure to covid-19: Secondary | ICD-10-CM | POA: Diagnosis not present

## 2020-01-01 DIAGNOSIS — Z01812 Encounter for preprocedural laboratory examination: Secondary | ICD-10-CM | POA: Insufficient documentation

## 2020-01-02 LAB — SARS CORONAVIRUS 2 (TAT 6-24 HRS): SARS Coronavirus 2: NEGATIVE

## 2020-01-05 ENCOUNTER — Encounter: Payer: Self-pay | Admitting: Urology

## 2020-01-05 ENCOUNTER — Other Ambulatory Visit: Payer: Self-pay

## 2020-01-05 ENCOUNTER — Ambulatory Visit: Payer: Medicare Other

## 2020-01-05 ENCOUNTER — Ambulatory Visit
Admission: RE | Admit: 2020-01-05 | Discharge: 2020-01-05 | Disposition: A | Discharge status: Home / Self Care | Payer: Medicare Other | Attending: Urology | Admitting: Urology

## 2020-01-05 ENCOUNTER — Encounter
Admission: RE | Disposition: A | Discharge status: Home / Self Care | Payer: Self-pay | Source: Home / Self Care | Attending: Urology

## 2020-01-05 ENCOUNTER — Telehealth: Payer: Self-pay | Admitting: Urology

## 2020-01-05 DIAGNOSIS — J449 Chronic obstructive pulmonary disease, unspecified: Secondary | ICD-10-CM | POA: Diagnosis not present

## 2020-01-05 DIAGNOSIS — I2510 Atherosclerotic heart disease of native coronary artery without angina pectoris: Secondary | ICD-10-CM | POA: Insufficient documentation

## 2020-01-05 DIAGNOSIS — N200 Calculus of kidney: Secondary | ICD-10-CM

## 2020-01-05 DIAGNOSIS — I10 Essential (primary) hypertension: Secondary | ICD-10-CM | POA: Insufficient documentation

## 2020-01-05 DIAGNOSIS — N202 Calculus of kidney with calculus of ureter: Secondary | ICD-10-CM | POA: Diagnosis not present

## 2020-01-05 DIAGNOSIS — K219 Gastro-esophageal reflux disease without esophagitis: Secondary | ICD-10-CM | POA: Diagnosis not present

## 2020-01-05 DIAGNOSIS — E785 Hyperlipidemia, unspecified: Secondary | ICD-10-CM | POA: Insufficient documentation

## 2020-01-05 DIAGNOSIS — Z79899 Other long term (current) drug therapy: Secondary | ICD-10-CM | POA: Insufficient documentation

## 2020-01-05 DIAGNOSIS — N1831 Chronic kidney disease, stage 3a: Secondary | ICD-10-CM | POA: Diagnosis not present

## 2020-01-05 DIAGNOSIS — R0682 Tachypnea, not elsewhere classified: Secondary | ICD-10-CM | POA: Insufficient documentation

## 2020-01-05 DIAGNOSIS — I129 Hypertensive chronic kidney disease with stage 1 through stage 4 chronic kidney disease, or unspecified chronic kidney disease: Secondary | ICD-10-CM | POA: Diagnosis not present

## 2020-01-05 DIAGNOSIS — J439 Emphysema, unspecified: Secondary | ICD-10-CM | POA: Diagnosis not present

## 2020-01-05 DIAGNOSIS — Z882 Allergy status to sulfonamides status: Secondary | ICD-10-CM | POA: Insufficient documentation

## 2020-01-05 DIAGNOSIS — N201 Calculus of ureter: Secondary | ICD-10-CM

## 2020-01-05 DIAGNOSIS — I739 Peripheral vascular disease, unspecified: Secondary | ICD-10-CM | POA: Diagnosis not present

## 2020-01-05 DIAGNOSIS — Z9049 Acquired absence of other specified parts of digestive tract: Secondary | ICD-10-CM | POA: Diagnosis not present

## 2020-01-05 DIAGNOSIS — Z87891 Personal history of nicotine dependence: Secondary | ICD-10-CM | POA: Insufficient documentation

## 2020-01-05 DIAGNOSIS — Z466 Encounter for fitting and adjustment of urinary device: Secondary | ICD-10-CM | POA: Diagnosis not present

## 2020-01-05 HISTORY — PX: CYSTOSCOPY/URETEROSCOPY/HOLMIUM LASER/STENT PLACEMENT: SHX6546

## 2020-01-05 HISTORY — PX: CYSTOSCOPY W/ RETROGRADES: SHX1426

## 2020-01-05 SURGERY — CYSTOSCOPY/URETEROSCOPY/HOLMIUM LASER/STENT PLACEMENT
Anesthesia: General | Site: Ureter | Laterality: Left

## 2020-01-05 MED ORDER — PHENYLEPHRINE HCL (PRESSORS) 10 MG/ML IV SOLN
INTRAVENOUS | Status: DC | PRN
  Administered 2020-01-05 (×2): 100 ug via INTRAVENOUS
  Administered 2020-01-05: 200 ug via INTRAVENOUS

## 2020-01-05 MED ORDER — CEFAZOLIN SODIUM-DEXTROSE 2-4 GM/100ML-% IV SOLN
INTRAVENOUS | Status: AC
  Filled 2020-01-05: qty 100

## 2020-01-05 MED ORDER — LACTATED RINGERS IV SOLN: INTRAVENOUS | Status: DC

## 2020-01-05 MED ORDER — FAMOTIDINE 20 MG PO TABS
ORAL_TABLET | ORAL | Status: AC
  Administered 2020-01-05: 20 mg via ORAL
  Filled 2020-01-05: qty 1

## 2020-01-05 MED ORDER — URIBEL 118 MG PO CAPS
1.0000 | ORAL_CAPSULE | Freq: Three times a day (TID) | ORAL | 0 refills | Status: DC | PRN
Start: 2020-01-05 — End: 2020-02-04

## 2020-01-05 MED ORDER — EPHEDRINE SULFATE 50 MG/ML IJ SOLN
INTRAMUSCULAR | Status: DC | PRN
  Administered 2020-01-05 (×2): 5 mg via INTRAVENOUS

## 2020-01-05 MED ORDER — ONDANSETRON HCL 4 MG/2ML IJ SOLN
INTRAMUSCULAR | Status: AC
  Filled 2020-01-05: qty 2

## 2020-01-05 MED ORDER — ORAL CARE MOUTH RINSE: 15.0000 mL | Freq: Once | OROMUCOSAL | Status: AC

## 2020-01-05 MED ORDER — PROPOFOL 500 MG/50ML IV EMUL
INTRAVENOUS | Status: AC
  Filled 2020-01-05: qty 50

## 2020-01-05 MED ORDER — FENTANYL CITRATE (PF) 100 MCG/2ML IJ SOLN: 25.0000 ug | INTRAMUSCULAR | Status: DC | PRN

## 2020-01-05 MED ORDER — DEXMEDETOMIDINE HCL IN NACL 200 MCG/50ML IV SOLN
INTRAVENOUS | Status: DC | PRN
  Administered 2020-01-05: 12 ug via INTRAVENOUS

## 2020-01-05 MED ORDER — EPHEDRINE 5 MG/ML INJ
INTRAVENOUS | Status: AC
  Filled 2020-01-05: qty 10

## 2020-01-05 MED ORDER — FENTANYL CITRATE (PF) 100 MCG/2ML IJ SOLN
INTRAMUSCULAR | Status: DC | PRN
  Administered 2020-01-05: 50 ug via INTRAVENOUS

## 2020-01-05 MED ORDER — PROPOFOL 10 MG/ML IV BOLUS
INTRAVENOUS | Status: DC | PRN
  Administered 2020-01-05: 100 mg via INTRAVENOUS

## 2020-01-05 MED ORDER — DEXAMETHASONE SODIUM PHOSPHATE 10 MG/ML IJ SOLN
INTRAMUSCULAR | Status: AC
  Filled 2020-01-05: qty 1

## 2020-01-05 MED ORDER — CHLORHEXIDINE GLUCONATE 0.12 % MT SOLN
OROMUCOSAL | Status: AC
  Administered 2020-01-05: 15 mL via OROMUCOSAL
  Filled 2020-01-05: qty 15

## 2020-01-05 MED ORDER — FENTANYL CITRATE (PF) 100 MCG/2ML IJ SOLN
INTRAMUSCULAR | Status: AC
  Filled 2020-01-05: qty 2

## 2020-01-05 MED ORDER — ROCURONIUM BROMIDE 10 MG/ML (PF) SYRINGE
PREFILLED_SYRINGE | INTRAVENOUS | Status: AC
  Filled 2020-01-05: qty 10

## 2020-01-05 MED ORDER — ONDANSETRON HCL 4 MG/2ML IJ SOLN: 4.0000 mg | Freq: Once | INTRAMUSCULAR | Status: DC | PRN

## 2020-01-05 MED ORDER — DEXAMETHASONE SODIUM PHOSPHATE 10 MG/ML IJ SOLN
INTRAMUSCULAR | Status: DC | PRN
  Administered 2020-01-05: 10 mg via INTRAVENOUS

## 2020-01-05 MED ORDER — SUGAMMADEX SODIUM 200 MG/2ML IV SOLN
INTRAVENOUS | Status: DC | PRN
  Administered 2020-01-05: 400 mg via INTRAVENOUS

## 2020-01-05 MED ORDER — LIDOCAINE HCL (CARDIAC) PF 100 MG/5ML IV SOSY
PREFILLED_SYRINGE | INTRAVENOUS | Status: DC | PRN
  Administered 2020-01-05: 60 mg via INTRAVENOUS

## 2020-01-05 MED ORDER — IOHEXOL 180 MG/ML  SOLN
INTRAMUSCULAR | Status: DC | PRN
  Administered 2020-01-05: 20 mL

## 2020-01-05 MED ORDER — LIDOCAINE HCL (PF) 2 % IJ SOLN
INTRAMUSCULAR | Status: AC
  Filled 2020-01-05: qty 5

## 2020-01-05 MED ORDER — ONDANSETRON HCL 4 MG/2ML IJ SOLN
INTRAMUSCULAR | Status: DC | PRN
  Administered 2020-01-05: 4 mg via INTRAVENOUS

## 2020-01-05 MED ORDER — CHLORHEXIDINE GLUCONATE 0.12 % MT SOLN: 15.0000 mL | Freq: Once | OROMUCOSAL | Status: AC

## 2020-01-05 MED ORDER — FAMOTIDINE 20 MG PO TABS: 20.0000 mg | ORAL_TABLET | Freq: Once | ORAL | Status: AC

## 2020-01-05 MED ORDER — ROCURONIUM BROMIDE 100 MG/10ML IV SOLN
INTRAVENOUS | Status: DC | PRN
  Administered 2020-01-05: 40 mg via INTRAVENOUS
  Administered 2020-01-05: 10 mg via INTRAVENOUS

## 2020-01-05 MED ORDER — PROPOFOL 10 MG/ML IV BOLUS
INTRAVENOUS | Status: AC
  Filled 2020-01-05: qty 20

## 2020-01-05 MED ORDER — CEFAZOLIN SODIUM-DEXTROSE 2-4 GM/100ML-% IV SOLN
2.0000 g | INTRAVENOUS | Status: AC
  Administered 2020-01-05: 2 g via INTRAVENOUS

## 2020-01-05 SURGICAL SUPPLY — 31 items
BAG DRAIN CYSTO-URO LG1000N (MISCELLANEOUS) ×4 IMPLANT
BASKET ZERO TIP 1.9FR (BASKET) ×2 IMPLANT
BRUSH SCRUB EZ 1% IODOPHOR (MISCELLANEOUS) ×4 IMPLANT
CATH URETL 5X70 OPEN END (CATHETERS) IMPLANT
CNTNR SPEC 2.5X3XGRAD LEK (MISCELLANEOUS)
CONT SPEC 4OZ STER OR WHT (MISCELLANEOUS)
CONT SPEC 4OZ STRL OR WHT (MISCELLANEOUS)
CONTAINER SPEC 2.5X3XGRAD LEK (MISCELLANEOUS) IMPLANT
DRAPE UTILITY 15X26 TOWEL STRL (DRAPES) ×4 IMPLANT
DRSG TEGADERM 2-3/8X2-3/4 SM (GAUZE/BANDAGES/DRESSINGS) ×2 IMPLANT
FIBER LASER TRACTIP 200 (UROLOGICAL SUPPLIES) ×4 IMPLANT
GLOVE BIOGEL PI IND STRL 7.5 (GLOVE) ×2 IMPLANT
GLOVE BIOGEL PI INDICATOR 7.5 (GLOVE) ×2
GOWN STRL REUS W/ TWL LRG LVL3 (GOWN DISPOSABLE) ×2 IMPLANT
GOWN STRL REUS W/ TWL XL LVL3 (GOWN DISPOSABLE) ×2 IMPLANT
GOWN STRL REUS W/TWL LRG LVL3 (GOWN DISPOSABLE) ×4
GOWN STRL REUS W/TWL XL LVL3 (GOWN DISPOSABLE) ×4
GUIDEWIRE STR DUAL SENSOR (WIRE) ×4 IMPLANT
INFUSOR MANOMETER BAG 3000ML (MISCELLANEOUS) ×4 IMPLANT
INTRODUCER DILATOR DOUBLE (INTRODUCER) IMPLANT
KIT TURNOVER CYSTO (KITS) ×4 IMPLANT
PACK CYSTO AR (MISCELLANEOUS) ×4 IMPLANT
SET CYSTO W/LG BORE CLAMP LF (SET/KITS/TRAYS/PACK) ×4 IMPLANT
SHEATH URETERAL 12FRX35CM (MISCELLANEOUS) IMPLANT
SOL .9 NS 3000ML IRR  AL (IV SOLUTION) ×4
SOL .9 NS 3000ML IRR UROMATIC (IV SOLUTION) ×2 IMPLANT
STENT URET 6FRX24 CONTOUR (STENTS) IMPLANT
STENT URET 6FRX26 CONTOUR (STENTS) ×2 IMPLANT
SURGILUBE 2OZ TUBE FLIPTOP (MISCELLANEOUS) ×4 IMPLANT
VALVE UROSEAL ADJ ENDO (VALVE) ×2 IMPLANT
WATER STERILE IRR 1000ML POUR (IV SOLUTION) ×4 IMPLANT

## 2020-01-05 NOTE — Anesthesia Postprocedure Evaluation (Signed)
Anesthesia Post Note  Patient: Timothy Eaton  Procedure(s) Performed: CYSTOSCOPY/URETEROSCOPY/HOLMIUM LASER/STENT Exchange (Left Ureter) CYSTOSCOPY WITH RETROGRADE PYELOGRAM (Left )  Patient location during evaluation: PACU Anesthesia Type: General Level of consciousness: awake and alert and oriented Pain management: pain level controlled Vital Signs Assessment: post-procedure vital signs reviewed and stable Respiratory status: spontaneous breathing, nonlabored ventilation and respiratory function stable Cardiovascular status: blood pressure returned to baseline and stable Postop Assessment: no signs of nausea or vomiting Anesthetic complications: no     Last Vitals:  Vitals:   01/05/20 1511 01/05/20 1526  BP: 129/86 124/72  Pulse: 63 (!) 57  Resp: 19 16  Temp: 36.7 C   SpO2: 99% 96%    Last Pain:  Vitals:   01/05/20 1526  TempSrc:   PainSc: 0-No pain                 Saleah Rishel

## 2020-01-05 NOTE — Discharge Instructions (Addendum)
DISCHARGE INSTRUCTIONS FOR KIDNEY STONE/URETERAL STENT   MEDICATIONS:  1. Resume all your other meds from home.  2.  Lynnda Shields is for burning with urination and bladder irritation, Rx was sent to your pharmacy.  ACTIVITY:  1. May resume regular activities in 24 hours. 2. No driving while on narcotic pain medications  3. Drink plenty of water  4. Continue to walk at home - you can still get blood clots when you are at home, so keep active, but don't over do it.  5. May return to work/school tomorrow or when you feel ready   BATHING:  1. You can shower. 2. You have a string coming from your urethra: The stent string is attached to your ureteral stent. Do not pull on this.   SIGNS/SYMPTOMS TO CALL:  Please call us if you have a fever greater than 101.5, uncontrolled nausea/vomiting, uncontrolled pain, dizziness, unable to urinate, excessively bloody urine, chest pain, shortness of breath, leg swelling, leg pain, or any other concerns or questions.   Common postoperative symptoms include urinary frequency, urgency, bladder spasm, burning.  Blood in the urine is common  You can reach [place] at [phone].   FOLLOW-UP:  1. You we will be contacted for a postop follow-up appointment 2. You have a string attached to your stent, you may remove it on Friday, 01/08/2020. To do this, pull the string until the stent is completely removed. You may feel an odd sensation in your back.  If you do not feel comfortable removing your own stent then please contact her office in a time will be arranged for you to come in to have it removed.    AMBULATORY SURGERY  DISCHARGE INSTRUCTIONS   1) The drugs that you were given will stay in your system until tomorrow so for the next 24 hours you should not:  A) Drive an automobile B) Make any legal decisions C) Drink any alcoholic beverage   2) You may resume regular meals tomorrow.  Today it is better to start with liquids and gradually work up to solid  foods.  You may eat anything you prefer, but it is better to start with liquids, then soup and crackers, and gradually work up to solid foods.   3) Please notify your doctor immediately if you have any unusual bleeding, trouble breathing, redness and pain at the surgery site, drainage, fever, or pain not relieved by medication.    4) Additional Instructions:        Please contact your physician with any problems or Same Day Surgery at [phone], Monday through Friday 6 am to 4 pm, or [place] at [facility] number at [phone].

## 2020-01-05 NOTE — Anesthesia Preprocedure Evaluation (Signed)
Anesthesia Evaluation  Patient identified by MRN, date of birth, ID band Patient awake    Reviewed: Allergy & Precautions, NPO status , Patient's Chart, lab work & pertinent test results  History of Anesthesia Complications Negative for: history of anesthetic complications  Airway Mallampati: III  TM Distance: >3 FB Neck ROM: Full    Dental  (+) Edentulous Upper, Edentulous Lower   Pulmonary neg sleep apnea, COPD,  COPD inhaler, former smoker,    breath sounds clear to auscultation- rhonchi (-) wheezing      Cardiovascular hypertension, Pt. on medications + Peripheral Vascular Disease (s/p AAA repair)  (-) CAD, (-) Past MI, (-) Cardiac Stents and (-) CABG + dysrhythmias (hx of postop afib)  Rhythm:Regular Rate:Normal - Systolic murmurs and - Diastolic murmurs    Neuro/Psych neg Seizures negative neurological ROS  negative psych ROS   GI/Hepatic Neg liver ROS, GERD  ,  Endo/Other  negative endocrine ROSneg diabetes  Renal/GU Renal disease (nephrolithiasis)     Musculoskeletal negative musculoskeletal ROS (+)   Abdominal (+) - obese,   Peds  Hematology negative hematology ROS (+)   Anesthesia Other Findings Past Medical History: No date: AAA (abdominal aortic aneurysm) ([place])     Comment:  [name], Repaired, 2005 No date: Atrial fibrillation (HCC)     Comment:  Postoperative in past No date: Carotid artery disease (HCC)     Comment:  Minimal, doppler 2004, 0-39% bilateral, doppler 05/17/09              0-39% bilateral No date: COPD (chronic obstructive pulmonary disease) (HCC) No date: Coronary artery disease     Comment:  Moderate, catheterization 2005, nuclear scan 9/09 no               ischemia, EF 60%, echo No date: ED (erectile dysfunction) No date: Ejection fraction     Comment:  EF 60% echo, September, 2009 No date: Family history of adverse reaction to anesthesia     Comment:  DAD-WOKE UP DURING  SURGERY No date: GERD (gastroesophageal reflux disease) No date: Hemorrhoids No date: Hyperlipidemia No date: Hypertension No date: Lung nodule     Comment:  Followup by pulmonary in past No date: Palpitations     Comment:  History palpitations in the past No date: Palpitations     Comment:  January, 2013 No date: Shortness of breath   Reproductive/Obstetrics                             Anesthesia Physical Anesthesia Plan  ASA: III  Anesthesia Plan: General   Post-op Pain Management:    Induction: Intravenous  PONV Risk Score and Plan: 1 and Ondansetron and Dexamethasone  Airway Management Planned: Oral ETT  Additional Equipment:   Intra-op Plan:   Post-operative Plan: Extubation in OR  Informed Consent: I have reviewed the patients History and Physical, chart, labs and discussed the procedure including the risks, benefits and alternatives for the proposed anesthesia with the patient or authorized representative who has indicated his/her understanding and acceptance.     Dental advisory given  Plan Discussed with: CRNA and Anesthesiologist  Anesthesia Plan Comments:         Anesthesia Quick Evaluation

## 2020-01-05 NOTE — Telephone Encounter (Signed)
Stent removal cx new app made Mailed to patient

## 2020-01-05 NOTE — Interval H&P Note (Signed)
History and Physical Interval Note:  01/05/2020 1:29 PM  Timothy Eaton  has presented today for surgery, with the diagnosis of left ureteral calculus, left nephrolithiasis.  The various methods of treatment have been discussed with the patient and family. After consideration of risks, benefits and other options for treatment, the patient has consented to  Procedure(s): CYSTOSCOPY/URETEROSCOPY/HOLMIUM LASER/STENT Exchange (Left) as a surgical intervention.  The patient's history has been reviewed, patient examined, no change in status, stable for surgery.  I have reviewed the patient's chart and labs.  Questions were answered to the patient's satisfaction.     [place]

## 2020-01-05 NOTE — Transfer of Care (Addendum)
Immediate Anesthesia Transfer of Care Note  Patient: Timothy Eaton  Procedure(s) Performed: CYSTOSCOPY/URETEROSCOPY/HOLMIUM LASER/STENT Exchange (Left Ureter) CYSTOSCOPY WITH RETROGRADE PYELOGRAM (Left )  Patient Location: PACU  Anesthesia Type:General  Level of Consciousness: awake, oriented and patient cooperative  Airway & Oxygen Therapy: Patient Spontanous Breathing and Patient connected to face mask oxygen  Post-op Assessment: Report given to RN and Post -op Vital signs reviewed and stable  Post vital signs: Reviewed and stable  Last Vitals:  Vitals Value Taken Time  BP 129/86 01/05/20 1511  Temp 36.7 C 01/05/20 1511  Pulse 58 01/05/20 1513  Resp 18 01/05/20 1513  SpO2 100 % 01/05/20 1513  Vitals shown include unvalidated device data.  Last Pain:  Vitals:   01/05/20 1156  TempSrc: Tympanic  PainSc: 0-No pain         Complications: No apparent anesthesia complications

## 2020-01-05 NOTE — Telephone Encounter (Signed)
-----   Message from Abbie Sons, MD sent at 01/05/2020  3:29 PM EDT ----- Regarding: Postop follow-up Can cancel the cystoscopy/stent removal appointment on 6/9.  Please schedule a postop follow-up with me late June/early July.

## 2020-01-05 NOTE — Anesthesia Procedure Notes (Addendum)
Procedure Name: Intubation Date/Time: 01/05/2020 1:45 PM Performed by: Kelton Pillar, CRNA Pre-anesthesia Checklist: Patient identified, Emergency Drugs available, Suction available and Patient being monitored Patient Re-evaluated:Patient Re-evaluated prior to induction Oxygen Delivery Method: Circle system utilized Preoxygenation: Pre-oxygenation with 100% oxygen Induction Type: IV induction Ventilation: Oral airway inserted - appropriate to patient size Laryngoscope Size: Mac and 4 Grade View: Grade I Tube type: Oral Tube size: 7.5 mm Number of attempts: 1 Airway Equipment and Method: Stylet and Oral airway Placement Confirmation: ETT inserted through vocal cords under direct vision,  positive ETCO2 and breath sounds checked- equal and bilateral Secured at: 22 cm Tube secured with: Tape Dental Injury: Teeth and Oropharynx as per pre-operative assessment  Comments: Atraumatic intubation by [place]

## 2020-01-05 NOTE — Op Note (Signed)
Preoperative diagnosis:  1.  Left proximal ureteral calculus 2.  Left nephrolithiasis   Postoperative diagnosis: Left nephrolithiasis  Procedure:  Cystoscopy Left ureteroscopy and stone removal Ureteroscopic laser lithotripsy Left ureteral stent exchange (6FR) 26 cm Left retrograde pyelography with interpretation  Surgeon: Nicki Reaper C. Charlsey Moragne, M.D.  Anesthesia: General  Complications: None  Intraoperative findings:  1.  Left retrograde pyelography demonstrated a filling defect within the renal pelvis consistent with the patients known calculus without other abnormalities.  EBL: Minimal  Specimens: Calculus fragments for analysis   Indication: Timothy Eaton is a 79 y.o. patient with a left proximal ureteral calculus with infection status post urgent stent placement on 12/12/2019.  He presents today for definitive stone treatment.  He also has nonobstructing left renal calculi.  After reviewing the management options for treatment, the patient elected to proceed with the above surgical procedure(s). We have discussed the potential benefits and risks of the procedure, side effects of the proposed treatment, the likelihood of the patient achieving the goals of the procedure, and any potential problems that might occur during the procedure or recuperation. Informed consent has been obtained.  Description of procedure:  The patient was taken to the operating room and general anesthesia was induced.  The patient was placed in the dorsal lithotomy position, prepped and draped in the usual sterile fashion, and preoperative antibiotics were administered. A preoperative time-out was performed.   A 21 French cystoscope was lubricated and passed under direct vision.  The urethra was normal in caliber without stricture.  The prostate demonstrated moderate lateral lobe enlargement mild-moderate bladder neck elevation.  Panendoscopy was performed and the bladder mucosa showed no erythema, solid or  papillary lesions with the exception of inflammatory changes secondary to his indwelling stent.  The ureteral stent was grasped with endoscopic forceps and brought out to the urethral meatus.  A 0.038 Sensor wire was placed through the stent and advanced proximally to the renal pelvis under fluoroscopic guidance.  The stent was then removed.  A digital flexible ureteroscope was passed per urethra and the left ureter orifice was easily engaged with the flexible ureteroscope.  The scope was advanced proximally and no ureteral calculus was identified.  The calculus was however identified in the renal pelvis.  Retrograde pyelogram was performed through the ureteroscope with findings as described above.  All calyces were examined under fluoroscopic guidance.  A 3 mm calculus was noted within the midpole calyx a second calculus visualized within the submucosal portion of a renal papilla was also noted as well as a small calculus in an upper pole calyx.  The renal pelvic calculus was placed within a 1.9 French nitinol basket and relocated to an upper pole calyx.  A 200 micron holmium laser fiber was placed through the ureteroscope and the stone was fragmented at a setting of 0.8 J and frequency of 8 hz.   All stone fragments were then removed from the collecting system with a zero tip nitinol basket as well as the additional calyceal calculi that were noted.  The ureteroscope was repassed one last time and all calyces were examined and no significant size stone fragments were identified.  The ureteroscope was removed and a 6 FR/26 CM Contour ureteral stent was was placed under fluoroscopic guidance.  The wire was then removed with an adequate stent curl noted in the renal pelvis as well as in the bladder.  The bladder was then emptied and the procedure ended.  The patient appeared to tolerate the  procedure well and without complications.  After anesthetic reversal the patient was transported to the PACU in  stable condition.   Plan: -The stent was left attached to a tether and the patient was instructed to remove on Friday, 01/08/2020   Timothy Giovanni, MD

## 2020-01-06 ENCOUNTER — Ambulatory Visit: Payer: Medicare Other | Admitting: Internal Medicine

## 2020-01-09 ENCOUNTER — Emergency Department: Payer: Medicare Other

## 2020-01-09 ENCOUNTER — Inpatient Hospital Stay
Admission: EM | Admit: 2020-01-09 | Discharge: 2020-01-12 | DRG: 699 | Disposition: A | Discharge status: Home / Self Care | Payer: Medicare Other | Attending: Internal Medicine | Admitting: Internal Medicine

## 2020-01-09 ENCOUNTER — Other Ambulatory Visit: Payer: Self-pay

## 2020-01-09 DIAGNOSIS — N10 Acute tubulo-interstitial nephritis: Secondary | ICD-10-CM | POA: Diagnosis not present

## 2020-01-09 DIAGNOSIS — I10 Essential (primary) hypertension: Secondary | ICD-10-CM | POA: Diagnosis not present

## 2020-01-09 DIAGNOSIS — N12 Tubulo-interstitial nephritis, not specified as acute or chronic: Secondary | ICD-10-CM | POA: Diagnosis not present

## 2020-01-09 DIAGNOSIS — R0902 Hypoxemia: Secondary | ICD-10-CM | POA: Diagnosis not present

## 2020-01-09 DIAGNOSIS — Z882 Allergy status to sulfonamides status: Secondary | ICD-10-CM

## 2020-01-09 DIAGNOSIS — R0689 Other abnormalities of breathing: Secondary | ICD-10-CM | POA: Diagnosis not present

## 2020-01-09 DIAGNOSIS — N1831 Chronic kidney disease, stage 3a: Secondary | ICD-10-CM | POA: Diagnosis not present

## 2020-01-09 DIAGNOSIS — I129 Hypertensive chronic kidney disease with stage 1 through stage 4 chronic kidney disease, or unspecified chronic kidney disease: Secondary | ICD-10-CM | POA: Diagnosis present

## 2020-01-09 DIAGNOSIS — Z20822 Contact with and (suspected) exposure to covid-19: Secondary | ICD-10-CM | POA: Diagnosis present

## 2020-01-09 DIAGNOSIS — R404 Transient alteration of awareness: Secondary | ICD-10-CM | POA: Diagnosis not present

## 2020-01-09 DIAGNOSIS — N401 Enlarged prostate with lower urinary tract symptoms: Secondary | ICD-10-CM | POA: Diagnosis present

## 2020-01-09 DIAGNOSIS — E785 Hyperlipidemia, unspecified: Secondary | ICD-10-CM | POA: Diagnosis not present

## 2020-01-09 DIAGNOSIS — N200 Calculus of kidney: Secondary | ICD-10-CM | POA: Diagnosis not present

## 2020-01-09 DIAGNOSIS — J439 Emphysema, unspecified: Secondary | ICD-10-CM | POA: Diagnosis not present

## 2020-01-09 DIAGNOSIS — Z823 Family history of stroke: Secondary | ICD-10-CM

## 2020-01-09 DIAGNOSIS — R52 Pain, unspecified: Secondary | ICD-10-CM | POA: Diagnosis not present

## 2020-01-09 DIAGNOSIS — Z7982 Long term (current) use of aspirin: Secondary | ICD-10-CM

## 2020-01-09 DIAGNOSIS — I4891 Unspecified atrial fibrillation: Secondary | ICD-10-CM | POA: Diagnosis present

## 2020-01-09 DIAGNOSIS — R410 Disorientation, unspecified: Secondary | ICD-10-CM | POA: Diagnosis not present

## 2020-01-09 DIAGNOSIS — F17220 Nicotine dependence, chewing tobacco, uncomplicated: Secondary | ICD-10-CM | POA: Diagnosis present

## 2020-01-09 DIAGNOSIS — Z9049 Acquired absence of other specified parts of digestive tract: Secondary | ICD-10-CM

## 2020-01-09 DIAGNOSIS — Z8249 Family history of ischemic heart disease and other diseases of the circulatory system: Secondary | ICD-10-CM

## 2020-01-09 DIAGNOSIS — R1110 Vomiting, unspecified: Secondary | ICD-10-CM | POA: Diagnosis not present

## 2020-01-09 DIAGNOSIS — T83592A Infection and inflammatory reaction due to indwelling ureteral stent, initial encounter: Principal | ICD-10-CM | POA: Diagnosis present

## 2020-01-09 DIAGNOSIS — I2510 Atherosclerotic heart disease of native coronary artery without angina pectoris: Secondary | ICD-10-CM | POA: Diagnosis present

## 2020-01-09 DIAGNOSIS — Z833 Family history of diabetes mellitus: Secondary | ICD-10-CM

## 2020-01-09 DIAGNOSIS — Y732 Prosthetic and other implants, materials and accessory gastroenterology and urology devices associated with adverse incidents: Secondary | ICD-10-CM | POA: Diagnosis present

## 2020-01-09 DIAGNOSIS — N9989 Other postprocedural complications and disorders of genitourinary system: Secondary | ICD-10-CM | POA: Diagnosis present

## 2020-01-09 DIAGNOSIS — I25119 Atherosclerotic heart disease of native coronary artery with unspecified angina pectoris: Secondary | ICD-10-CM | POA: Diagnosis present

## 2020-01-09 DIAGNOSIS — Z8679 Personal history of other diseases of the circulatory system: Secondary | ICD-10-CM

## 2020-01-09 DIAGNOSIS — N138 Other obstructive and reflux uropathy: Secondary | ICD-10-CM

## 2020-01-09 DIAGNOSIS — Z79899 Other long term (current) drug therapy: Secondary | ICD-10-CM

## 2020-01-09 DIAGNOSIS — K219 Gastro-esophageal reflux disease without esophagitis: Secondary | ICD-10-CM | POA: Diagnosis present

## 2020-01-09 DIAGNOSIS — N183 Chronic kidney disease, stage 3 unspecified: Secondary | ICD-10-CM | POA: Diagnosis present

## 2020-01-09 DIAGNOSIS — B965 Pseudomonas (aeruginosa) (mallei) (pseudomallei) as the cause of diseases classified elsewhere: Secondary | ICD-10-CM | POA: Diagnosis present

## 2020-01-09 DIAGNOSIS — I480 Paroxysmal atrial fibrillation: Secondary | ICD-10-CM | POA: Diagnosis present

## 2020-01-09 DIAGNOSIS — N390 Urinary tract infection, site not specified: Secondary | ICD-10-CM | POA: Diagnosis present

## 2020-01-09 LAB — COMPREHENSIVE METABOLIC PANEL
ALT: 21 U/L (ref 0–44)
AST: 37 U/L (ref 15–41)
Albumin: 3.3 g/dL — ABNORMAL LOW (ref 3.5–5.0)
Alkaline Phosphatase: 61 U/L (ref 38–126)
Anion gap: 8 (ref 5–15)
BUN: 22 mg/dL (ref 8–23)
CO2: 24 mmol/L (ref 22–32)
Calcium: 8.4 mg/dL — ABNORMAL LOW (ref 8.9–10.3)
Chloride: 107 mmol/L (ref 98–111)
Creatinine, Ser: 1.34 mg/dL — ABNORMAL HIGH (ref 0.61–1.24)
GFR calc Af Amer: 58 mL/min — ABNORMAL LOW (ref >60)
GFR calc non Af Amer: 50 mL/min — ABNORMAL LOW (ref >60)
Glucose, Bld: 125 mg/dL — ABNORMAL HIGH (ref 70–99)
Potassium: 3.8 mmol/L (ref 3.5–5.1)
Sodium: 139 mmol/L (ref 135–145)
Total Bilirubin: 1 mg/dL (ref 0.3–1.2)
Total Protein: 5.7 g/dL — ABNORMAL LOW (ref 6.5–8.1)

## 2020-01-09 LAB — CBC
HCT: 42 % (ref 39.0–52.0)
Hemoglobin: 14 g/dL (ref 13.0–17.0)
MCH: 30.7 pg (ref 26.0–34.0)
MCHC: 33.3 g/dL (ref 30.0–36.0)
MCV: 92.1 fL (ref 80.0–100.0)
Platelets: 121 10*3/uL — ABNORMAL LOW (ref 150–400)
RBC: 4.56 MIL/uL (ref 4.22–5.81)
RDW: 13.2 % (ref 11.5–15.5)
WBC: 6.2 10*3/uL (ref 4.0–10.5)
nRBC: 0 % (ref 0.0–0.2)

## 2020-01-09 LAB — URINALYSIS, COMPLETE (UACMP) WITH MICROSCOPIC
Bilirubin Urine: NEGATIVE
Glucose, UA: NEGATIVE mg/dL
Ketones, ur: 5 mg/dL — AB
Nitrite: POSITIVE — AB
Protein, ur: 30 mg/dL — AB
RBC / HPF: >50 RBC/hpf — ABNORMAL HIGH (ref 0–5)
Specific Gravity, Urine: 1.013 (ref 1.005–1.030)
Squamous Epithelial / HPF: NONE SEEN (ref 0–5)
WBC, UA: >50 WBC/hpf — ABNORMAL HIGH (ref 0–5)
pH: 7 (ref 5.0–8.0)

## 2020-01-09 LAB — LIPASE, BLOOD: Lipase: 51 U/L (ref 11–51)

## 2020-01-09 LAB — SARS CORONAVIRUS 2 BY RT PCR (HOSPITAL ORDER, PERFORMED IN ~~LOC~~ HOSPITAL LAB): SARS Coronavirus 2: NEGATIVE

## 2020-01-09 MED ORDER — SODIUM CHLORIDE 0.9 % IV BOLUS
1000.0000 mL | Freq: Once | INTRAVENOUS | Status: AC
  Administered 2020-01-09: 1000 mL via INTRAVENOUS

## 2020-01-09 MED ORDER — ALBUTEROL SULFATE HFA 108 (90 BASE) MCG/ACT IN AERS: 2.0000 | INHALATION_SPRAY | RESPIRATORY_TRACT | Status: DC | PRN

## 2020-01-09 MED ORDER — URIBEL 118 MG PO CAPS: 1.0000 | ORAL_CAPSULE | Freq: Three times a day (TID) | ORAL | Status: DC | PRN

## 2020-01-09 MED ORDER — DILTIAZEM HCL ER COATED BEADS 120 MG PO CP24
240.0000 mg | ORAL_CAPSULE | ORAL | Status: DC
  Administered 2020-01-10 – 2020-01-12 (×3): 240 mg via ORAL
  Filled 2020-01-09 (×3): qty 2

## 2020-01-09 MED ORDER — ONDANSETRON HCL 4 MG/2ML IJ SOLN
4.0000 mg | Freq: Three times a day (TID) | INTRAMUSCULAR | Status: DC | PRN
  Administered 2020-01-09 – 2020-01-10 (×2): 4 mg via INTRAVENOUS
  Filled 2020-01-09 (×2): qty 2

## 2020-01-09 MED ORDER — ACETAMINOPHEN 325 MG PO TABS: 650.0000 mg | ORAL_TABLET | Freq: Four times a day (QID) | ORAL | Status: DC | PRN

## 2020-01-09 MED ORDER — METOPROLOL TARTRATE 25 MG PO TABS
25.0000 mg | ORAL_TABLET | Freq: Two times a day (BID) | ORAL | Status: DC
  Administered 2020-01-10 – 2020-01-11 (×3): 25 mg via ORAL
  Filled 2020-01-09 (×6): qty 1

## 2020-01-09 MED ORDER — SODIUM CHLORIDE 0.9% FLUSH: 3.0000 mL | Freq: Once | INTRAVENOUS | Status: DC

## 2020-01-09 MED ORDER — SODIUM CHLORIDE 0.9 % IV SOLN
1.0000 g | Freq: Once | INTRAVENOUS | Status: AC
  Administered 2020-01-09: 1 g via INTRAVENOUS
  Filled 2020-01-09: qty 10

## 2020-01-09 MED ORDER — SODIUM CHLORIDE 0.9 % IV SOLN: INTRAVENOUS | Status: DC

## 2020-01-09 MED ORDER — ASPIRIN EC 81 MG PO TBEC
81.0000 mg | DELAYED_RELEASE_TABLET | Freq: Every day | ORAL | Status: DC
  Administered 2020-01-09 – 2020-01-12 (×4): 81 mg via ORAL
  Filled 2020-01-09 (×4): qty 1

## 2020-01-09 MED ORDER — TAMSULOSIN HCL 0.4 MG PO CAPS
0.4000 mg | ORAL_CAPSULE | Freq: Every day | ORAL | Status: DC
  Administered 2020-01-09 – 2020-01-11 (×3): 0.4 mg via ORAL
  Filled 2020-01-09 (×3): qty 1

## 2020-01-09 MED ORDER — SODIUM CHLORIDE 0.9 % IV SOLN
1.0000 g | INTRAVENOUS | Status: DC
  Administered 2020-01-10: 1 g via INTRAVENOUS
  Filled 2020-01-09: qty 1
  Filled 2020-01-09: qty 10

## 2020-01-09 MED ORDER — IOHEXOL 300 MG/ML  SOLN
100.0000 mL | Freq: Once | INTRAMUSCULAR | Status: AC | PRN
  Administered 2020-01-09: 100 mL via INTRAVENOUS

## 2020-01-09 MED ORDER — ALBUTEROL SULFATE (2.5 MG/3ML) 0.083% IN NEBU: 2.5000 mg | INHALATION_SOLUTION | RESPIRATORY_TRACT | Status: DC | PRN

## 2020-01-09 MED ORDER — TRAMADOL HCL 50 MG PO TABS
50.0000 mg | ORAL_TABLET | Freq: Four times a day (QID) | ORAL | Status: DC | PRN
  Administered 2020-01-09 – 2020-01-10 (×2): 50 mg via ORAL
  Filled 2020-01-09 (×2): qty 1

## 2020-01-09 MED ORDER — ROSUVASTATIN CALCIUM 10 MG PO TABS: 20.0000 mg | ORAL_TABLET | ORAL | Status: DC

## 2020-01-09 MED ORDER — ENOXAPARIN SODIUM 40 MG/0.4ML ~~LOC~~ SOLN
40.0000 mg | SUBCUTANEOUS | Status: DC
  Administered 2020-01-09 – 2020-01-11 (×3): 40 mg via SUBCUTANEOUS
  Filled 2020-01-09 (×3): qty 0.4

## 2020-01-09 NOTE — H&P (Signed)
History and Physical    DEL OVERFELT TKZ:[id number] DOB: January 28, 1941 DOA: 01/09/2020  Referring MD/NP/PA:   PCP: [name], MD   Patient coming from:  The patient is coming from home.  At baseline, pt is independent for most of ADL.        Chief Complaint: Nausea, vomiting, groin pain  HPI: Timothy Eaton is a 79 y.o. male with medical history significant of hypertension, hyperlipidemia, COPD, GERD, CAD, atrial fibrillation not on anticoagulants, AAA, carotid artery stenosis, BPH, kidney stone, pyelonephritis, CKD stage III, who presents with nausea, vomiting and groin pain.  Pt recently had cystoscopy and left ureteroscopy w/ stone removal and stent exchange w/ urology on 01/05/20. Yesterday the stent was removed. Patient states he tolerated procedure well, but developed nausea vomiting and groin pain last night.  He has had 5-6 times of normal bloody nonbilious vomiting.  No abdominal pain or diarrhea.  Patient denies hematuria, dysuria or burning on urination.  He has mild pain in groin area.  Denies chest pain, cough, shortness breath.  No fever or chills.  Patient was lethargic earlier, which has resolved.  Currently patient is alert, oriented x3.   ED Course: pt was found to have WBC 6.2, lipase 51, positive urinalysis for UTI (cloudy appearance, large amount of leukocyte, positive nitrite, many bacteria, WBC >50), slightly worsening renal function, temperature 99.2, blood pressure 123/69, heart rate 91, RR 20, oxygen saturation 91 to 94% on room air. Pt is placed on MedSurg bed for observation.  Review of Systems:   General: no fevers, chills, no body weight gain, has fatigue HEENT: no blurry vision, hearing changes or sore throat Respiratory: no dyspnea, coughing, wheezing CV: no chest pain, no palpitations GI: has nausea, vomiting, no bdominal pain, diarrhea, constipation GU: no dysuria, burning on urination, increased urinary frequency, hematuria. Has groin pain Ext: no leg  edema Neuro: no unilateral weakness, numbness, or tingling, no vision change or hearing loss Skin: no rash, no skin tear. MSK: No muscle spasm, no deformity, no limitation of range of movement in spin Heme: No easy bruising.  Travel history: No recent long distant travel.  Allergy:  Allergies  Allergen Reactions  . Sulfamethoxazole-Trimethoprim Rash    Past Medical History:  Diagnosis Date  . AAA (abdominal aortic aneurysm) ([place])    [name], Repaired, 2005  . Atrial fibrillation (HCC)    Postoperative in past  . Carotid artery disease ([place])    Minimal, doppler 2004, 0-39% bilateral, doppler 05/17/09 0-39% bilateral  . COPD (chronic obstructive pulmonary disease) ([place])   . Coronary artery disease    Moderate, catheterization 2005, nuclear scan 9/09 no ischemia, EF 60%, echo  . ED (erectile dysfunction)   . Ejection fraction    EF 60% echo, September, 2009  . Family history of adverse reaction to anesthesia    DAD-WOKE UP DURING SURGERY  . GERD (gastroesophageal reflux disease)   . Hemorrhoids   . Hyperlipidemia   . Hypertension   . Lung nodule    Followup by pulmonary in past  . Palpitations    History palpitations in the past  . Palpitations    January, 2013  . Shortness of breath     Past Surgical History:  Procedure Laterality Date  . ABDOMINAL AORTIC ANEURYSM REPAIR  04/06/04   Dr. [name]  . BACK SURGERY    . CHOLECYSTECTOMY  11/26/02  . CYSTOSCOPY W/ RETROGRADES Left 01/05/2020   Procedure: CYSTOSCOPY WITH RETROGRADE PYELOGRAM;  Surgeon: John Giovanni  C, MD;  Location: [facility] ORS;  Service: Urology;  Laterality: Left;  . CYSTOSCOPY WITH STENT PLACEMENT Left 12/12/2019   Procedure: CYSTOSCOPY WITH STENT PLACEMENT;  Surgeon: Lucas Mallow, MD;  Location: [facility];  Service: Urology;  Laterality: Left;  . CYSTOSCOPY/URETEROSCOPY/HOLMIUM LASER/STENT PLACEMENT Left 01/05/2020   Procedure: CYSTOSCOPY/URETEROSCOPY/HOLMIUM LASER/STENT Exchange;  Surgeon: Abbie Sons,  MD;  Location: [facility];  Service: Urology;  Laterality: Left;  . PILONIDAL CYST EXCISION  11/11    Social History:  reports that he quit smoking about 17 years ago. His smokeless tobacco use includes chew. He reports that he does not drink alcohol or use drugs.  Family History:  Family History  Problem Relation Age of Onset  . Hypertension Mother        ?  . Stroke Mother        CVA  . Diabetes Mother        DM  . Heart attack Father        MI  . Cancer Sister        Gall bladder   . Stroke Other        All mom's family died of strokes     Prior to Admission medications   Medication Sig Start Date End Date Taking? Authorizing Provider  aspirin 81 MG EC tablet Take 81 mg by mouth daily.      [provider]  diltiazem (CARDIZEM CD) 240 MG 24 hr capsule TAKE 1 CAPSULE BY MOUTH EVERY DAY Patient taking differently: Take 240 mg by mouth every morning.  04/14/19   Nahser, Wonda Cheng, MD  Meth-Hyo-M Barnett Hatter Phos-Ph Sal (URIBEL) 118 MG CAPS Take 1 capsule (118 mg total) by mouth 3 (three) times daily as needed (Urinary frequency, urgency, burning). 01/05/20   Stoioff, Ronda Fairly, MD  metoprolol tartrate (LOPRESSOR) 25 MG tablet TAKE 1 TABLET BY MOUTH TWICE DAILY. PLEASE KEEP APPOINTMENT IN JULY WITH DOCTOR NAHSER BFOR FUTURE REFILLS Patient taking differently: Take 25 mg by mouth 2 (two) times daily.  04/14/19   Nahser, Wonda Cheng, MD  rosuvastatin (CRESTOR) 20 MG tablet TAKE 1 TABLET BY MOUTH ONCE DAILY Patient taking differently: Take 20 mg by mouth every morning.  04/29/19   Nahser, Wonda Cheng, MD  tamsulosin (FLOMAX) 0.4 MG CAPS capsule Take 1 capsule (0.4 mg total) by mouth daily. Patient taking differently: Take 0.4 mg by mouth daily after supper.  07/07/19   [name], MD    Physical Exam: Vitals:   01/09/20 1530 01/09/20 1600 01/09/20 1630 01/09/20 1720  BP: (!) 141/71 136/83 (!) 123/54 121/68  Pulse: 90 95 97 (!) 102  Resp:    18  Temp:    98.5 F (36.9 C)  TempSrc:     Oral  SpO2: 93% 93% 93% 95%  Weight:      Height:       General: Not in acute distress HEENT:       Eyes: PERRL, EOMI, no scleral icterus.       ENT: No discharge from the ears and nose, no pharynx injection, no tonsillar enlargement.        Neck: No JVD, no bruit, no mass felt. Heme: No neck lymph node enlargement. Cardiac: S1/S2, RRR, No murmurs, No gallops or rubs. Respiratory: No rales, wheezing, rhonchi or rubs. GI: Soft, nondistended, nontender, no rebound pain, no organomegaly, BS present. GU: No hematuria Ext: No pitting leg edema bilaterally. 2+DP/PT pulse bilaterally. Musculoskeletal: No joint deformities, No joint  redness or warmth, no limitation of ROM in spin. Skin: No rashes.  Neuro: Alert, oriented X3, cranial nerves II-XII grossly intact, moves all extremities normally.  Psych: Patient is not psychotic, no suicidal or hemocidal ideation.  Labs on Admission: I have personally reviewed following labs and imaging studies  CBC: Recent Labs  Lab 01/09/20 1006  WBC 6.2  HGB 14.0  HCT 42.0  MCV 92.1  PLT 229*   Basic Metabolic Panel: Recent Labs  Lab 01/09/20 1006  NA 139  K 3.8  CL 107  CO2 24  GLUCOSE 125*  BUN 22  CREATININE 1.34*  CALCIUM 8.4*   GFR: Estimated Creatinine Clearance: 43.7 mL/min (A) (by C-G formula based on SCr of 1.34 mg/dL (H)). Liver Function Tests: Recent Labs  Lab 01/09/20 1006  AST 37  ALT 21  ALKPHOS 61  BILITOT 1.0  PROT 5.7*  ALBUMIN 3.3*   Recent Labs  Lab 01/09/20 1006  LIPASE 51   No results for input(s): AMMONIA in the last 168 hours. Coagulation Profile: No results for input(s): INR, PROTIME in the last 168 hours. Cardiac Enzymes: No results for input(s): CKTOTAL, CKMB, CKMBINDEX, TROPONINI in the last 168 hours. BNP (last 3 results) No results for input(s): PROBNP in the last 8760 hours. HbA1C: No results for input(s): HGBA1C in the last 72 hours. CBG: No results for input(s): GLUCAP in the last 168  hours. Lipid Profile: No results for input(s): CHOL, HDL, LDLCALC, TRIG, CHOLHDL, LDLDIRECT in the last 72 hours. Thyroid Function Tests: No results for input(s): TSH, T4TOTAL, FREET4, T3FREE, THYROIDAB in the last 72 hours. Anemia Panel: No results for input(s): VITAMINB12, FOLATE, FERRITIN, TIBC, IRON, RETICCTPCT in the last 72 hours. Urine analysis:    Component Value Date/Time   COLORURINE YELLOW (A) 01/09/2020 1053   APPEARANCEUR CLOUDY (A) 01/09/2020 1053   APPEARANCEUR Cloudy (A) 12/17/2019 1329   LABSPEC 1.013 01/09/2020 1053   PHURINE 7.0 01/09/2020 1053   GLUCOSEU NEGATIVE 01/09/2020 1053   HGBUR LARGE (A) 01/09/2020 1053   BILIRUBINUR NEGATIVE 01/09/2020 1053   BILIRUBINUR Negative 12/17/2019 1329   KETONESUR 5 (A) 01/09/2020 1053   PROTEINUR 30 (A) 01/09/2020 1053   UROBILINOGEN 1.0 12/09/2019 1416   NITRITE POSITIVE (A) 01/09/2020 1053   LEUKOCYTESUR LARGE (A) 01/09/2020 1053   Sepsis Labs: @LABRCNTIP (procalcitonin:4,lacticidven:4) ) Recent Results (from the past 240 hour(s))  SARS CORONAVIRUS 2 (TAT 6-24 HRS) Nasopharyngeal Nasopharyngeal Swab     Status: None   Collection Time: 01/01/20 12:02 PM   Specimen: Nasopharyngeal Swab  Result Value Ref Range Status   SARS Coronavirus 2 NEGATIVE NEGATIVE Final    Comment: (NOTE) SARS-CoV-2 target nucleic acids are NOT DETECTED. The SARS-CoV-2 RNA is generally detectable in upper and lower respiratory specimens during the acute phase of infection. Negative results do not preclude SARS-CoV-2 infection, do not rule out co-infections with other pathogens, and should not be used as the sole basis for treatment or other patient management decisions. Negative results must be combined with clinical observations, patient history, and epidemiological information. The expected result is Negative. Fact Sheet for Patients: SugarRoll.be Fact Sheet for Healthcare  Providers: https://www.woods-mathews.com/ This test is not yet approved or cleared by the [place] FDA and  has been authorized for detection and/or diagnosis of SARS-CoV-2 by FDA under an Emergency Use Authorization (EUA). This EUA will remain  in effect (meaning this test can be used) for the duration of the COVID-19 declaration under Section 56 4(b)(1) of the Act, 21 U.S.C.  section 360bbb-3(b)(1), unless the authorization is terminated or revoked sooner. Performed at [facility], [place] [address], [place], [place] [address]   SARS Coronavirus 2 by RT PCR (hospital order, performed in [facility] hospital lab) Nasopharyngeal Nasopharyngeal Swab     Status: None   Collection Time: 01/09/20  1:29 PM   Specimen: Nasopharyngeal Swab  Result Value Ref Range Status   SARS Coronavirus 2 NEGATIVE NEGATIVE Final    Comment: (NOTE) SARS-CoV-2 target nucleic acids are NOT DETECTED. The SARS-CoV-2 RNA is generally detectable in upper and lower respiratory specimens during the acute phase of infection. The lowest concentration of SARS-CoV-2 viral copies this assay can detect is 250 copies / mL. A negative result does not preclude SARS-CoV-2 infection and should not be used as the sole basis for treatment or other patient management decisions.  A negative result may occur with improper specimen collection / handling, submission of specimen other than nasopharyngeal swab, presence of viral mutation(s) within the areas targeted by this assay, and inadequate number of viral copies (<250 copies / mL). A negative result must be combined with clinical observations, patient history, and epidemiological information. Fact Sheet for Patients:   StrictlyIdeas.no Fact Sheet for Healthcare Providers: BankingDealers.co.za This test is not yet approved or cleared  by the [place] FDA and has been authorized for detection and/or diagnosis  of SARS-CoV-2 by FDA under an Emergency Use Authorization (EUA).  This EUA will remain in effect (meaning this test can be used) for the duration of the COVID-19 declaration under Section 564(b)(1) of the Act, 21 U.S.C. section 360bbb-3(b)(1), unless the authorization is terminated or revoked sooner. Performed at [facility], [address] [place], [place] [address]      Radiological Exams on Admission: CT Abdomen Pelvis W Contrast  Result Date: 01/09/2020 CLINICAL DATA:  Post cystoscopy and ureteral stent placement on 01/05/2020 for nephrolithiasis, stent removal 01/08/2020, now with vomiting, increased confusion, history of abdominal aortic aneurysm post repair, atrial fibrillation, COPD, coronary artery disease, hypertension, GERD EXAM: CT ABDOMEN AND PELVIS WITH CONTRAST TECHNIQUE: Multidetector CT imaging of the abdomen and pelvis was performed using the standard protocol following bolus administration of intravenous contrast. Sagittal and coronal MPR images reconstructed from axial data set. CONTRAST:  183mL OMNIPAQUE IOHEXOL 300 MG/ML SOLN IV. No oral contrast administered. COMPARISON:  12/11/2019, 04/19/2011 FINDINGS: Lower chest: Lung bases emphysematous with LEFT lower lobe nodule again identified 8 mm diameter image 13 unchanged since 04/19/2011. Additional questionable 9 mm non nodular density at RIGHT diaphragm image 13 unchanged since 04/19/2011. Hepatobiliary: Gallbladder surgically absent. Few tiny cysts within the LEFT lobe liver. Liver otherwise unremarkable. Pancreas: Normal appearance Spleen: Normal appearance Adrenals/Urinary Tract: Adrenal glands normal appearance. BILATERAL renal cysts. Additional mildly hyperdense lesions within the kidneys including 19 x 17 mm nodule anterior upper pole LEFT kidney image 25 and 18 x 19 mm anterior upper to mid RIGHT renal lesion image 27 unchanged. Tiny nonobstructing RIGHT renal calculus. Diffuse wall thickening and mild dilatation  of LEFT ureter throughout its course to the bladder with minimal associated periureteral infiltration, cannot exclude passed calculus or urinary tract infection. No ureteral calcifications seen. RIGHT ureter nondilated. Tiny amount of air within urinary bladder question instrumentation/catheterization. Stomach/Bowel: Duodenal diverticulum noted. Small hiatal hernia. Scattered colonic diverticulosis greatest at sigmoid colon. Normal appendix. Stomach and bowel loops otherwise unremarkable. Vascular/Lymphatic: Atherosclerotic calcifications aorta, iliac arteries, visceral artery origins, and coronary arteries. Prior distal abdominal aortic aneurysm repair. No adenopathy. Reproductive: Prostatic enlargement  with gland 5.6 x 4.8 x 5.1 cm (volume = 72 cm^3) Other: No free air or free fluid. No hernia or inflammatory process. Musculoskeletal: Osseous demineralization. Scattered degenerative disc disease changes lumbar spine and Schmorl's nodes. IMPRESSION: Diffuse wall thickening and mild dilatation of LEFT ureter throughout its course to the bladder with minimal associated periureteral infiltration, cannot exclude passed calculus or urinary tract infection, recommend correlation with urinalysis. Tiny nonobstructing RIGHT renal calculus. BILATERAL renal cysts with additional mildly hyperdense lesions within the kidneys, question hyperdense cysts, stable. Prostatic enlargement. Colonic diverticulosis without evidence of diverticulitis. Small hiatal hernia. Stable LEFT lower lobe nodule and questionable non nodular density at RIGHT diaphragm. Prior abdominal aortic aneurysm repair. Aortic Atherosclerosis (ICD10-I70.0) and Emphysema (ICD10-J43.9). Electronically Signed   By: Lavonia Dana M.D.   On: 01/09/2020 12:34     EKG:  Not done in ED  Assessment/Plan Principal Problem:   Complicated UTI (urinary tract infection) Active Problems:   Hyperlipidemia   Essential hypertension   COPD (chronic obstructive pulmonary  disease) with emphysema (HCC)   Coronary artery disease   Atrial fibrillation (HCC)   BPH with obstruction/lower urinary tract symptoms   CKD (chronic kidney disease), stage IIIa   Complicated UTI (urinary tract infection): Patient does not have fever or leukocytosis to be clinically not septic. -Placed on MedSurg bed for observation -IV Rocephin -Follow-up blood culture and urine culture  Hyperlipidemia -crestor  Essential hypertension: Blood pressure 123/69 -Continue Cardizem, metoprolol  COPD (chronic obstructive pulmonary disease) with emphysema ([place]): Stable -As needed albuterol  Coronary artery disease: No chest pain -Continue aspirin and Crestor  Atrial fibrillation ([place]): Patient is not taking anticoagulants currently -Continue Cardizem and metoprolol  BPH with obstruction/lower urinary tract symptoms -Flomax  CKD (chronic kidney disease), stage IIIa: Stable, close to baseline -Follow-up with BMP     DVT ppx: SQ Lovenox Code Status: Full code Family Communication: Yes, patient's  Wife at bed side Disposition Plan:  Anticipate discharge back to previous environment Consults called:  none Admission status: Med-surg bed for obs     Status is: Observation  The patient remains OBS appropriate and will d/c before 2 midnights.  Dispo: The patient is from: Home              Anticipated d/c is to: Home              Anticipated d/c date is: 1 day              Patient currently is not medically stable to d/c.           Date of Service 01/09/2020    [facility]   If 7PM-7AM, please contact night-coverage [email or web address] 01/09/2020, 6:53 PM

## 2020-01-09 NOTE — ED Triage Notes (Signed)
Pt arrives via ems from home, pt had a surgery for a urethral blockage on June 1st, wife stated to ems that he is more confused than normal, and has been vomiting over night  Ems reports pt has vomited twice in their presence  Vs: 99.4 orally, 22 resp, 130/74, 90% RA  500 ml, and 4mg  of zofran administered via ems  18g started by ems

## 2020-01-09 NOTE — ED Notes (Signed)
Pt taken to ct 

## 2020-01-09 NOTE — ED Provider Notes (Signed)
[facility] Emergency Department Provider Note  ____________________________________________   First MD Initiated Contact with Patient 01/09/20 1016     (approximate)  I have reviewed the triage vital signs and the nursing notes.  History  Chief Complaint Emesis and Post-op Problem    HPI Timothy Eaton is a 79 y.o. male PMHx as below, notable for recent cystoscopy and left ureteroscopy w/ stone removal and stent exchange w/ urology on 01/05/20. Patient states he tolerated the recent procedure well w/o difficulty. Yesterday he removed the stent as scheduled/instructed. No issues immediately after. Urinating w/o difficulty.  This morning he developed nausea and has had several episodes of vomiting, at least 4-5.  He arrives with bilious stained emesis to his shirt.  Wife had apparently also reported to EMS that he seemed a little bit more confused than normal as well.  Given 500 mL IV fluids and 4 mg of Zofran with EMS.  On my arrival, patient is able to provide a clear history.  States stent removal yesterday went without issue.  He denies any abdominal pain, reports his nausea is improving with Zofran.  No diarrhea.  No chest pain or shortness of breath.  On chart review, it appears that he was initially diagnosed with this stone and associated pyelonephritis at the beginning of May.  Grew Enterococcus faecalis in his urine.  Received Unasyn and completed a 2-week course of Augmentin.  Does not appear he has been on any other antibiotics.   Past Medical Hx Past Medical History:  Diagnosis Date  . AAA (abdominal aortic aneurysm) ([place])    [name], Repaired, 2005  . Atrial fibrillation (HCC)    Postoperative in past  . Carotid artery disease ([place])    Minimal, doppler 2004, 0-39% bilateral, doppler 05/17/09 0-39% bilateral  . COPD (chronic obstructive pulmonary disease) ([place])   . Coronary artery disease    Moderate, catheterization 2005, nuclear scan 9/09 no  ischemia, EF 60%, echo  . ED (erectile dysfunction)   . Ejection fraction    EF 60% echo, September, 2009  . Family history of adverse reaction to anesthesia    DAD-WOKE UP DURING SURGERY  . GERD (gastroesophageal reflux disease)   . Hemorrhoids   . Hyperlipidemia   . Hypertension   . Lung nodule    Followup by pulmonary in past  . Palpitations    History palpitations in the past  . Palpitations    January, 2013  . Shortness of breath     Problem List Patient Active Problem List   Diagnosis Date Noted  . Nephrolithiasis 12/19/2019  . Pyelonephritis 12/12/2019  . Acute pyelonephritis 12/11/2019  . Acute renal failure superimposed on stage 3a chronic kidney disease ([place]) 12/11/2019  . Memory loss 12/09/2019  . BPH with obstruction/lower urinary tract symptoms 07/07/2019  . UTI (urinary tract infection) 06/30/2019  . Elevated troponin 06/30/2019  . Preventative health care 04/09/2017  . Advance directive discussed with patient 04/09/2017  . COPD exacerbation ([place]) 07/29/2015  . Coronary artery disease   . Carotid artery disease ([place])   . Atrial fibrillation ([place])   . Pulmonary nodules 04/02/2011  . COPD (chronic obstructive pulmonary disease) with emphysema ([place]) 04/02/2009  . HEMORRHOIDS, HX OF 04/02/2009  . Hyperlipidemia 06/25/2008  . Essential hypertension 06/25/2008    Past Surgical Hx Past Surgical History:  Procedure Laterality Date  . ABDOMINAL AORTIC ANEURYSM REPAIR  04/06/04   Dr. [name]  . BACK SURGERY    . CHOLECYSTECTOMY  11/26/02  . CYSTOSCOPY W/ RETROGRADES Left 01/05/2020   Procedure: CYSTOSCOPY WITH RETROGRADE PYELOGRAM;  Surgeon: Abbie Sons, MD;  Location: [facility] ORS;  Service: Urology;  Laterality: Left;  . CYSTOSCOPY WITH STENT PLACEMENT Left 12/12/2019   Procedure: CYSTOSCOPY WITH STENT PLACEMENT;  Surgeon: Lucas Mallow, MD;  Location: [facility] ORS;  Service: Urology;  Laterality: Left;  . CYSTOSCOPY/URETEROSCOPY/HOLMIUM LASER/STENT PLACEMENT  Left 01/05/2020   Procedure: CYSTOSCOPY/URETEROSCOPY/HOLMIUM LASER/STENT Exchange;  Surgeon: Abbie Sons, MD;  Location: [facility] ORS;  Service: Urology;  Laterality: Left;  . PILONIDAL CYST EXCISION  11/11    Medications Prior to Admission medications   Medication Sig Start Date End Date Taking? Authorizing Provider  aspirin 81 MG EC tablet Take 81 mg by mouth daily.      [provider]  diltiazem (CARDIZEM CD) 240 MG 24 hr capsule TAKE 1 CAPSULE BY MOUTH EVERY DAY Patient taking differently: Take 240 mg by mouth every morning.  04/14/19   Nahser, Wonda Cheng, MD  Meth-Hyo-M Barnett Hatter Phos-Ph Sal (URIBEL) 118 MG CAPS Take 1 capsule (118 mg total) by mouth 3 (three) times daily as needed (Urinary frequency, urgency, burning). 01/05/20   Stoioff, Ronda Fairly, MD  metoprolol tartrate (LOPRESSOR) 25 MG tablet TAKE 1 TABLET BY MOUTH TWICE DAILY. PLEASE KEEP APPOINTMENT IN JULY WITH DOCTOR NAHSER BFOR FUTURE REFILLS Patient taking differently: Take 25 mg by mouth 2 (two) times daily.  04/14/19   Nahser, Wonda Cheng, MD  rosuvastatin (CRESTOR) 20 MG tablet TAKE 1 TABLET BY MOUTH ONCE DAILY Patient taking differently: Take 20 mg by mouth every morning.  04/29/19   Nahser, Wonda Cheng, MD  tamsulosin (FLOMAX) 0.4 MG CAPS capsule Take 1 capsule (0.4 mg total) by mouth daily. Patient taking differently: Take 0.4 mg by mouth daily after supper.  07/07/19   [name], MD    Allergies Sulfamethoxazole-trimethoprim  Family Hx Family History  Problem Relation Age of Onset  . Hypertension Mother        ?  . Stroke Mother        CVA  . Diabetes Mother        DM  . Heart attack Father        MI  . Cancer Sister        Gall bladder   . Stroke Other        All mom's family died of strokes    Social Hx Social History   Tobacco Use  . Smoking status: Former Smoker    Quit date: 08/06/2002    Years since quitting: 17.4  . Smokeless tobacco: Current User    Types: Chew  Substance Use Topics  .  Alcohol use: No  . Drug use: No     Review of Systems  Constitutional: Negative for fever. Negative for chills. Eyes: Negative for visual changes. ENT: Negative for sore throat. Cardiovascular: Negative for chest pain. Respiratory: Negative for shortness of breath. Gastrointestinal: + nausea, vomiting Genitourinary: Negative for dysuria. Musculoskeletal: Negative for leg swelling. Skin: Negative for rash. Neurological: Negative for headaches.   Physical Exam  Vital Signs: ED Triage Vitals [01/09/20 1010]  Enc Vitals Group     BP 132/66     Pulse Rate 90     Resp 20     Temp 99.2 F (37.3 C)     Temp Source Oral     SpO2 94 %     Weight 154 lb 12.8 oz (70.2 kg)  Height 5\' 10"  (1.778 m)     Head Circumference      Peak Flow      Pain Score 8     Pain Loc      Pain Edu?      Excl. in [place]?     Constitutional: Alert and oriented. NAD. Bilious emesis stained shirt.  Head: Normocephalic. Atraumatic. Eyes: Conjunctivae clear. Sclera anicteric. Pupils equal and symmetric. Nose: No masses or lesions. No congestion or rhinorrhea. Mouth/Throat: Wearing mask. MM slightly dry.  Neck: No stridor. Trachea midline.  Cardiovascular: Normal rate. Extremities well perfused. Respiratory: Normal respiratory effort.  Lungs CTAB. Gastrointestinal: Soft. Non-distended. Non-tender throughout. Old, weal healed midline abdominal incision scar.  Genitourinary: Deferred.  Musculoskeletal: No lower extremity edema. No deformities. Neurologic:  Normal speech and language. No gross focal or lateralizing neurologic deficits are appreciated.  Skin: Skin is warm, dry and intact. No rash noted. Psychiatric: Mood and affect are appropriate for situation.    Radiology  Personally reviewed available imaging myself.   CT A/P - IMPRESSION:  - Diffuse wall thickening and mild dilatation of LEFT ureter  throughout its course to the bladder with minimal associated  periureteral infiltration,  cannot exclude passed calculus or urinary  tract infection, recommend correlation with urinalysis.  - Tiny nonobstructing RIGHT renal calculus.  - BILATERAL renal cysts with additional mildly hyperdense lesions  within the kidneys, question hyperdense cysts, stable.  - Prostatic enlargement.  - Colonic diverticulosis without evidence of diverticulitis.  - Small hiatal hernia.  - Stable LEFT lower lobe nodule and questionable non nodular density  at RIGHT diaphragm.  - Prior abdominal aortic aneurysm repair.  - Aortic Atherosclerosis (ICD10-I70.0) and Emphysema (ICD10-J43.9).    Procedures  Procedure(s) performed (including critical care):  Procedures   Initial Impression / Assessment and Plan / MDM / ED Course  79 y.o. male who presents to the ED for nausea, vomiting, after recent recent cystoscopy and left ureteroscopy w/ stone removal and stent exchange w/ urology on 01/05/20.  Ddx: post operative complication, UTI, pyelonephritis, obstruction, electrolyte abnormality   Will plan for labs, UA, fluid, symptom control, imaging  Labs revealed mild AKI, receiving fluids.  UA positive for infection.  Understandably, recent instrumentation could produce a falsely infectious looking UA, however his sample today does seem overwhelmingly positive for infection with LE, nitrites, WBCs, and bacteria.  In the setting of his vomiting, recent instrumentation, will opt to treat with antibiotics.  CT scan as above, consistent with recent history.  Patient feeling modestly improved.  Updated patient and wife at bedside on results and plan of care.  They voiced understanding and are in agreement.   _______________________________   As part of my medical decision making I have reviewed available labs, radiology tests, reviewed old records/performed chart review, obtained additional history from family.    Final Clinical Impression(s) / ED Diagnosis  Final diagnoses:  Vomiting in adult    Urinary tract infection in male       Note:  This document was prepared using Dragon voice recognition software and may include unintentional dictation errors.   Lilia Pro., MD 01/09/20 [phone]

## 2020-01-09 NOTE — ED Notes (Signed)
Attempted report. Secretary Retail banker is getting patient and will call back to get report

## 2020-01-10 DIAGNOSIS — Z79899 Other long term (current) drug therapy: Secondary | ICD-10-CM | POA: Diagnosis not present

## 2020-01-10 DIAGNOSIS — J439 Emphysema, unspecified: Secondary | ICD-10-CM | POA: Diagnosis present

## 2020-01-10 DIAGNOSIS — Z823 Family history of stroke: Secondary | ICD-10-CM | POA: Diagnosis not present

## 2020-01-10 DIAGNOSIS — Z8679 Personal history of other diseases of the circulatory system: Secondary | ICD-10-CM | POA: Diagnosis not present

## 2020-01-10 DIAGNOSIS — R1110 Vomiting, unspecified: Secondary | ICD-10-CM | POA: Diagnosis not present

## 2020-01-10 DIAGNOSIS — Z833 Family history of diabetes mellitus: Secondary | ICD-10-CM | POA: Diagnosis not present

## 2020-01-10 DIAGNOSIS — N390 Urinary tract infection, site not specified: Secondary | ICD-10-CM

## 2020-01-10 DIAGNOSIS — Z20822 Contact with and (suspected) exposure to covid-19: Secondary | ICD-10-CM | POA: Diagnosis present

## 2020-01-10 DIAGNOSIS — N12 Tubulo-interstitial nephritis, not specified as acute or chronic: Secondary | ICD-10-CM | POA: Diagnosis present

## 2020-01-10 DIAGNOSIS — Y732 Prosthetic and other implants, materials and accessory gastroenterology and urology devices associated with adverse incidents: Secondary | ICD-10-CM | POA: Diagnosis present

## 2020-01-10 DIAGNOSIS — Z882 Allergy status to sulfonamides status: Secondary | ICD-10-CM | POA: Diagnosis not present

## 2020-01-10 DIAGNOSIS — N401 Enlarged prostate with lower urinary tract symptoms: Secondary | ICD-10-CM | POA: Diagnosis present

## 2020-01-10 DIAGNOSIS — N138 Other obstructive and reflux uropathy: Secondary | ICD-10-CM | POA: Diagnosis present

## 2020-01-10 DIAGNOSIS — I480 Paroxysmal atrial fibrillation: Secondary | ICD-10-CM | POA: Diagnosis present

## 2020-01-10 DIAGNOSIS — K219 Gastro-esophageal reflux disease without esophagitis: Secondary | ICD-10-CM | POA: Diagnosis present

## 2020-01-10 DIAGNOSIS — Z7982 Long term (current) use of aspirin: Secondary | ICD-10-CM | POA: Diagnosis not present

## 2020-01-10 DIAGNOSIS — E785 Hyperlipidemia, unspecified: Secondary | ICD-10-CM | POA: Diagnosis present

## 2020-01-10 DIAGNOSIS — N9989 Other postprocedural complications and disorders of genitourinary system: Secondary | ICD-10-CM | POA: Diagnosis present

## 2020-01-10 DIAGNOSIS — F17220 Nicotine dependence, chewing tobacco, uncomplicated: Secondary | ICD-10-CM | POA: Diagnosis present

## 2020-01-10 DIAGNOSIS — Z8249 Family history of ischemic heart disease and other diseases of the circulatory system: Secondary | ICD-10-CM | POA: Diagnosis not present

## 2020-01-10 DIAGNOSIS — I2510 Atherosclerotic heart disease of native coronary artery without angina pectoris: Secondary | ICD-10-CM | POA: Diagnosis present

## 2020-01-10 DIAGNOSIS — B965 Pseudomonas (aeruginosa) (mallei) (pseudomallei) as the cause of diseases classified elsewhere: Secondary | ICD-10-CM | POA: Diagnosis present

## 2020-01-10 DIAGNOSIS — T83592A Infection and inflammatory reaction due to indwelling ureteral stent, initial encounter: Secondary | ICD-10-CM | POA: Diagnosis present

## 2020-01-10 DIAGNOSIS — N1831 Chronic kidney disease, stage 3a: Secondary | ICD-10-CM | POA: Diagnosis present

## 2020-01-10 DIAGNOSIS — Z9049 Acquired absence of other specified parts of digestive tract: Secondary | ICD-10-CM | POA: Diagnosis not present

## 2020-01-10 DIAGNOSIS — I129 Hypertensive chronic kidney disease with stage 1 through stage 4 chronic kidney disease, or unspecified chronic kidney disease: Secondary | ICD-10-CM | POA: Diagnosis present

## 2020-01-10 LAB — BASIC METABOLIC PANEL
Anion gap: 4 — ABNORMAL LOW (ref 5–15)
BUN: 26 mg/dL — ABNORMAL HIGH (ref 8–23)
CO2: 27 mmol/L (ref 22–32)
Calcium: 8.1 mg/dL — ABNORMAL LOW (ref 8.9–10.3)
Chloride: 106 mmol/L (ref 98–111)
Creatinine, Ser: 1.4 mg/dL — ABNORMAL HIGH (ref 0.61–1.24)
GFR calc Af Amer: 55 mL/min — ABNORMAL LOW (ref >60)
GFR calc non Af Amer: 47 mL/min — ABNORMAL LOW (ref >60)
Glucose, Bld: 110 mg/dL — ABNORMAL HIGH (ref 70–99)
Potassium: 4.4 mmol/L (ref 3.5–5.1)
Sodium: 137 mmol/L (ref 135–145)

## 2020-01-10 LAB — CBC
HCT: 40 % (ref 39.0–52.0)
Hemoglobin: 13 g/dL (ref 13.0–17.0)
MCH: 30.2 pg (ref 26.0–34.0)
MCHC: 32.5 g/dL (ref 30.0–36.0)
MCV: 93 fL (ref 80.0–100.0)
Platelets: 116 10*3/uL — ABNORMAL LOW (ref 150–400)
RBC: 4.3 MIL/uL (ref 4.22–5.81)
RDW: 13.3 % (ref 11.5–15.5)
WBC: 10.3 10*3/uL (ref 4.0–10.5)
nRBC: 0 % (ref 0.0–0.2)

## 2020-01-10 MED ORDER — MAGNESIUM HYDROXIDE 400 MG/5ML PO SUSP
5.0000 mL | Freq: Four times a day (QID) | ORAL | Status: DC | PRN
  Administered 2020-01-10: 5 mL via ORAL
  Filled 2020-01-10: qty 30

## 2020-01-10 MED ORDER — SODIUM CHLORIDE 0.9 % IV SOLN
2.0000 g | Freq: Three times a day (TID) | INTRAVENOUS | Status: DC
  Administered 2020-01-10 – 2020-01-11 (×4): 2 g via INTRAVENOUS
  Filled 2020-01-10: qty 2000
  Filled 2020-01-10 (×3): qty 2
  Filled 2020-01-10: qty 2000
  Filled 2020-01-10: qty 2

## 2020-01-10 MED ORDER — SENNOSIDES-DOCUSATE SODIUM 8.6-50 MG PO TABS
1.0000 | ORAL_TABLET | Freq: Two times a day (BID) | ORAL | Status: DC
  Administered 2020-01-10 – 2020-01-12 (×4): 1 via ORAL
  Filled 2020-01-10 (×4): qty 1

## 2020-01-10 MED ORDER — ROSUVASTATIN CALCIUM 10 MG PO TABS
20.0000 mg | ORAL_TABLET | Freq: Every day | ORAL | Status: DC
  Administered 2020-01-10 – 2020-01-11 (×2): 20 mg via ORAL
  Filled 2020-01-10 (×2): qty 2

## 2020-01-10 NOTE — Progress Notes (Signed)
PROGRESS NOTE    NAVARRO NINE  TKZ:[id number] DOB: [birth year] DOA: 01/09/2020 PCP: [name], MD   Brief Narrative:  HPI: Timothy Eaton is a 79 y.o. male with medical history significant of hypertension, hyperlipidemia, COPD, GERD, CAD, atrial fibrillation not on anticoagulants, AAA, carotid artery stenosis, BPH, kidney stone, pyelonephritis, CKD stage III, who presents with nausea, vomiting and groin pain.  Pt recently had cystoscopy and left ureteroscopy w/ stone removal and stent exchange w/ urology on 01/05/20. Yesterday the stent was removed. Patient states he tolerated procedure well, but developed nausea vomiting and groin pain last night.  He has had 5-6 times of normal bloody nonbilious vomiting.  No abdominal pain or diarrhea.  Patient denies hematuria, dysuria or burning on urination.  He has mild pain in groin area.  Denies chest pain, cough, shortness breath.  No fever or chills.  Patient was lethargic earlier, which has resolved.  Currently patient is alert, oriented x3.  6/6: Patient seen and examined.  Hemodynamically stable but notably tremulous and shivering on my evaluation.  Afebrile however.  White count slightly worse from yesterday.  Kidney function slightly worse from yesterday.  CT abdomen pelvis did not show any obstruction.  There was some mild left ureteral inflammation consistent with possible recently passed stone versus urinary tract infection.  Remains on IV Rocephin.   Assessment & Plan:   Principal Problem:   Complicated UTI (urinary tract infection) Active Problems:   Hyperlipidemia   Essential hypertension   COPD (chronic obstructive pulmonary disease) with emphysema (HCC)   Coronary artery disease   Atrial fibrillation (HCC)   BPH with obstruction/lower urinary tract symptoms   CKD (chronic kidney disease), stage IIIa  Complicated UTI (urinary tract infection):  No fevers or worsening leukocytosis since admission Symptoms likely secondary to  recently passed stone versus residual infection in the setting of a recent instrumentation Previous urine culture demonstrating Enterococcus sensitive to ampicillin Plan: Continue IV Rocephin for now Add IV ampicillin 2 g every 8 hours Follow urine cultures for pathogen notification and antibiotic selection Follow-up blood cultures APAP fever as needed Follow blood cultures   Hyperlipidemia -crestor  Essential hypertension: Blood pressure 123/69 -Continue Cardizem, metoprolol  COPD (chronic obstructive pulmonary disease) with emphysema ([place]): Stable -As needed albuterol  Coronary artery disease: No chest pain -Continue aspirin and Crestor  Atrial fibrillation ([place]): Patient is not taking anticoagulants currently -Continue Cardizem and metoprolol  BPH with obstruction/lower urinary tract symptoms -Flomax  CKD (chronic kidney disease), stage IIIa: Stable, close to baseline -Follow-up with BMP   DVT prophylaxis: Lovenox Code Status: Full Family Communication: None today Disposition Plan: Status is: Inpatient  Remains inpatient appropriate because:Inpatient level of care appropriate due to severity of illness   Dispo: The patient is from: Home              Anticipated d/c is to: Home              Anticipated d/c date is: 1 day              Patient currently is not medically stable to d/c.        Consultants:   None  Procedures:   None  Antimicrobials:   Rocephin, 01/09/2020-  Ampicillin, 01/10/2020-   Subjective: Seen and examined.  Appears tremulous otherwise no acute distress.  Vital signs stable.  No acute complaints.  Objective: Vitals:   01/09/20 2349 01/10/20 0105 01/10/20 0431 01/10/20 0911  BP: (!) 99/59 95/60 Marland Kitchen)  107/52 130/66  Pulse: 95 87 84 (!) 103  Resp: 18   20  Temp: 98 F (36.7 C)  98.3 F (36.8 C)   TempSrc: Oral  Oral   SpO2: 93%  96% 90%  Weight:      Height:        Intake/Output Summary (Last 24 hours) at 01/10/2020  1114 Last data filed at 01/10/2020 0900 Gross per 24 hour  Intake 1461.43 ml  Output 625 ml  Net 836.43 ml   Filed Weights   01/09/20 1010  Weight: 70.2 kg    Examination:  General exam: Appears to be tremulous otherwise no acute distress Respiratory system: Clear to auscultation. Respiratory effort normal. Cardiovascular system: S1 & S2 heard, RRR. No JVD, murmurs, rubs, gallops or clicks. No pedal edema. Gastrointestinal system: Abdomen is nondistended, soft and nontender. No organomegaly or masses felt. Normal bowel sounds heard. Central nervous system: Alert and oriented. No focal neurological deficits. Extremities: Symmetric 5 x 5 power. Skin: No rashes, lesions or ulcers Psychiatry: Judgement and insight appear normal. Mood & affect appropriate.     Data Reviewed: I have personally reviewed following labs and imaging studies  CBC: Recent Labs  Lab 01/09/20 1006 01/10/20 0618  WBC 6.2 10.3  HGB 14.0 13.0  HCT 42.0 40.0  MCV 92.1 93.0  PLT 121* 681*   Basic Metabolic Panel: Recent Labs  Lab 01/09/20 1006 01/10/20 0618  NA 139 137  K 3.8 4.4  CL 107 106  CO2 24 27  GLUCOSE 125* 110*  BUN 22 26*  CREATININE 1.34* 1.40*  CALCIUM 8.4* 8.1*   GFR: Estimated Creatinine Clearance: 41.8 mL/min (A) (by C-G formula based on SCr of 1.4 mg/dL (H)). Liver Function Tests: Recent Labs  Lab 01/09/20 1006  AST 37  ALT 21  ALKPHOS 61  BILITOT 1.0  PROT 5.7*  ALBUMIN 3.3*   Recent Labs  Lab 01/09/20 1006  LIPASE 51   No results for input(s): AMMONIA in the last 168 hours. Coagulation Profile: No results for input(s): INR, PROTIME in the last 168 hours. Cardiac Enzymes: No results for input(s): CKTOTAL, CKMB, CKMBINDEX, TROPONINI in the last 168 hours. BNP (last 3 results) No results for input(s): PROBNP in the last 8760 hours. HbA1C: No results for input(s): HGBA1C in the last 72 hours. CBG: No results for input(s): GLUCAP in the last 168 hours. Lipid  Profile: No results for input(s): CHOL, HDL, LDLCALC, TRIG, CHOLHDL, LDLDIRECT in the last 72 hours. Thyroid Function Tests: No results for input(s): TSH, T4TOTAL, FREET4, T3FREE, THYROIDAB in the last 72 hours. Anemia Panel: No results for input(s): VITAMINB12, FOLATE, FERRITIN, TIBC, IRON, RETICCTPCT in the last 72 hours. Sepsis Labs: No results for input(s): PROCALCITON, LATICACIDVEN in the last 168 hours.  Recent Results (from the past 240 hour(s))  SARS CORONAVIRUS 2 (TAT 6-24 HRS) Nasopharyngeal Nasopharyngeal Swab     Status: None   Collection Time: 01/01/20 12:02 PM   Specimen: Nasopharyngeal Swab  Result Value Ref Range Status   SARS Coronavirus 2 NEGATIVE NEGATIVE Final    Comment: (NOTE) SARS-CoV-2 target nucleic acids are NOT DETECTED. The SARS-CoV-2 RNA is generally detectable in upper and lower respiratory specimens during the acute phase of infection. Negative results do not preclude SARS-CoV-2 infection, do not rule out co-infections with other pathogens, and should not be used as the sole basis for treatment or other patient management decisions. Negative results must be combined with clinical observations, patient history, and epidemiological information. The expected  result is Negative. Fact Sheet for Patients: SugarRoll.be Fact Sheet for Healthcare Providers: https://www.woods-mathews.com/ This test is not yet approved or cleared by the [place] FDA and  has been authorized for detection and/or diagnosis of SARS-CoV-2 by FDA under an Emergency Use Authorization (EUA). This EUA will remain  in effect (meaning this test can be used) for the duration of the COVID-19 declaration under Section 56 4(b)(1) of the Act, 21 U.S.C. section 360bbb-3(b)(1), unless the authorization is terminated or revoked sooner. Performed at [facility], [place] [address] [place], [place] [address]   CULTURE, BLOOD (ROUTINE X 2) w  Reflex to ID Panel     Status: None (Preliminary result)   Collection Time: 01/09/20  1:20 PM   Specimen: BLOOD  Result Value Ref Range Status   Specimen Description BLOOD L AC  Final   Special Requests   Final    BOTTLES DRAWN AEROBIC AND ANAEROBIC Blood Culture results may not be optimal due to an inadequate volume of blood received in culture bottles   Culture   Final    NO GROWTH < 24 HOURS Performed at [facility], [address], [place], [place] [address]    Report Status PENDING  Incomplete  CULTURE, BLOOD (ROUTINE X 2) w Reflex to ID Panel     Status: None (Preliminary result)   Collection Time: 01/09/20  1:21 PM   Specimen: BLOOD  Result Value Ref Range Status   Specimen Description BLOOD RAC  Final   Special Requests   Final    BOTTLES DRAWN AEROBIC AND ANAEROBIC Blood Culture adequate volume   Culture   Final    NO GROWTH < 24 HOURS Performed at [facility], [address] [place], [place] [address]    Report Status PENDING  Incomplete  SARS Coronavirus 2 by RT PCR (hospital order, performed in [place] hospital lab) Nasopharyngeal Nasopharyngeal Swab     Status: None   Collection Time: 01/09/20  1:29 PM   Specimen: Nasopharyngeal Swab  Result Value Ref Range Status   SARS Coronavirus 2 NEGATIVE NEGATIVE Final    Comment: (NOTE) SARS-CoV-2 target nucleic acids are NOT DETECTED. The SARS-CoV-2 RNA is generally detectable in upper and lower respiratory specimens during the acute phase of infection. The lowest concentration of SARS-CoV-2 viral copies this assay can detect is 250 copies / mL. A negative result does not preclude SARS-CoV-2 infection and should not be used as the sole basis for treatment or other patient management decisions.  A negative result may occur with improper specimen collection / handling, submission of specimen other than nasopharyngeal swab, presence of viral mutation(s) within the areas targeted by this assay, and  inadequate number of viral copies (<250 copies / mL). A negative result must be combined with clinical observations, patient history, and epidemiological information. Fact Sheet for Patients:   StrictlyIdeas.no Fact Sheet for Healthcare Providers: BankingDealers.co.za This test is not yet approved or cleared  by the [place] FDA and has been authorized for detection and/or diagnosis of SARS-CoV-2 by FDA under an Emergency Use Authorization (EUA).  This EUA will remain in effect (meaning this test can be used) for the duration of the COVID-19 declaration under Section 564(b)(1) of the Act, 21 U.S.C. section 360bbb-3(b)(1), unless the authorization is terminated or revoked sooner. Performed at [facility], [address], [place],  [address]          Radiology Studies: CT Abdomen Pelvis W Contrast  Result  Date: 01/09/2020 CLINICAL DATA:  Post cystoscopy and ureteral stent placement on 01/05/2020 for nephrolithiasis, stent removal 01/08/2020, now with vomiting, increased confusion, history of abdominal aortic aneurysm post repair, atrial fibrillation, COPD, coronary artery disease, hypertension, GERD EXAM: CT ABDOMEN AND PELVIS WITH CONTRAST TECHNIQUE: Multidetector CT imaging of the abdomen and pelvis was performed using the standard protocol following bolus administration of intravenous contrast. Sagittal and coronal MPR images reconstructed from axial data set. CONTRAST:  111mL OMNIPAQUE IOHEXOL 300 MG/ML SOLN IV. No oral contrast administered. COMPARISON:  12/11/2019, 04/19/2011 FINDINGS: Lower chest: Lung bases emphysematous with LEFT lower lobe nodule again identified 8 mm diameter image 13 unchanged since 04/19/2011. Additional questionable 9 mm non nodular density at RIGHT diaphragm image 13 unchanged since 04/19/2011. Hepatobiliary: Gallbladder surgically absent. Few tiny cysts within the LEFT lobe liver. Liver  otherwise unremarkable. Pancreas: Normal appearance Spleen: Normal appearance Adrenals/Urinary Tract: Adrenal glands normal appearance. BILATERAL renal cysts. Additional mildly hyperdense lesions within the kidneys including 19 x 17 mm nodule anterior upper pole LEFT kidney image 25 and 18 x 19 mm anterior upper to mid RIGHT renal lesion image 27 unchanged. Tiny nonobstructing RIGHT renal calculus. Diffuse wall thickening and mild dilatation of LEFT ureter throughout its course to the bladder with minimal associated periureteral infiltration, cannot exclude passed calculus or urinary tract infection. No ureteral calcifications seen. RIGHT ureter nondilated. Tiny amount of air within urinary bladder question instrumentation/catheterization. Stomach/Bowel: Duodenal diverticulum noted. Small hiatal hernia. Scattered colonic diverticulosis greatest at sigmoid colon. Normal appendix. Stomach and bowel loops otherwise unremarkable. Vascular/Lymphatic: Atherosclerotic calcifications aorta, iliac arteries, visceral artery origins, and coronary arteries. Prior distal abdominal aortic aneurysm repair. No adenopathy. Reproductive: Prostatic enlargement with gland 5.6 x 4.8 x 5.1 cm (volume = 72 cm^3) Other: No free air or free fluid. No hernia or inflammatory process. Musculoskeletal: Osseous demineralization. Scattered degenerative disc disease changes lumbar spine and Schmorl's nodes. IMPRESSION: Diffuse wall thickening and mild dilatation of LEFT ureter throughout its course to the bladder with minimal associated periureteral infiltration, cannot exclude passed calculus or urinary tract infection, recommend correlation with urinalysis. Tiny nonobstructing RIGHT renal calculus. BILATERAL renal cysts with additional mildly hyperdense lesions within the kidneys, question hyperdense cysts, stable. Prostatic enlargement. Colonic diverticulosis without evidence of diverticulitis. Small hiatal hernia. Stable LEFT lower lobe nodule  and questionable non nodular density at RIGHT diaphragm. Prior abdominal aortic aneurysm repair. Aortic Atherosclerosis (ICD10-I70.0) and Emphysema (ICD10-J43.9). Electronically Signed   By: Lavonia Dana M.D.   On: 01/09/2020 12:34        Scheduled Meds: . aspirin EC  81 mg Oral Daily  . diltiazem  240 mg Oral [id number]  . enoxaparin (LOVENOX) injection  40 mg Subcutaneous Q24H  . metoprolol tartrate  25 mg Oral BID  . rosuvastatin  20 mg Oral q1800  . tamsulosin  0.4 mg Oral QPC supper   Continuous Infusions: . sodium chloride 100 mL/hr at 01/10/20 0228  . ampicillin (OMNIPEN) IV 2 g (01/10/20 1103)  . cefTRIAXone (ROCEPHIN)  IV       LOS: 0 days    Time spent: 35 minutes    Sidney Ace, MD [facility] Pager [phone]  If 7PM-7AM, please contact night-coverage 01/10/2020, 11:14 AM

## 2020-01-11 LAB — URINE CULTURE: Culture: >=100000 — AB

## 2020-01-11 LAB — CBC WITH DIFFERENTIAL/PLATELET
Abs Immature Granulocytes: 0.16 10*3/uL — ABNORMAL HIGH (ref 0.00–0.07)
Basophils Absolute: 0 10*3/uL (ref 0.0–0.1)
Basophils Relative: 0 %
Eosinophils Absolute: 0.1 10*3/uL (ref 0.0–0.5)
Eosinophils Relative: 1 %
HCT: 37.1 % — ABNORMAL LOW (ref 39.0–52.0)
Hemoglobin: 12.6 g/dL — ABNORMAL LOW (ref 13.0–17.0)
Immature Granulocytes: 2 %
Lymphocytes Relative: 8 %
Lymphs Abs: 0.6 10*3/uL — ABNORMAL LOW (ref 0.7–4.0)
MCH: 30.7 pg (ref 26.0–34.0)
MCHC: 34 g/dL (ref 30.0–36.0)
MCV: 90.5 fL (ref 80.0–100.0)
Monocytes Absolute: 0.7 10*3/uL (ref 0.1–1.0)
Monocytes Relative: 10 %
Neutro Abs: 5.6 10*3/uL (ref 1.7–7.7)
Neutrophils Relative %: 79 %
Platelets: 102 10*3/uL — ABNORMAL LOW (ref 150–400)
RBC: 4.1 MIL/uL — ABNORMAL LOW (ref 4.22–5.81)
RDW: 13.2 % (ref 11.5–15.5)
WBC: 7.2 10*3/uL (ref 4.0–10.5)
nRBC: 0 % (ref 0.0–0.2)

## 2020-01-11 LAB — BASIC METABOLIC PANEL
Anion gap: 8 (ref 5–15)
BUN: 28 mg/dL — ABNORMAL HIGH (ref 8–23)
CO2: 22 mmol/L (ref 22–32)
Calcium: 7.9 mg/dL — ABNORMAL LOW (ref 8.9–10.3)
Chloride: 104 mmol/L (ref 98–111)
Creatinine, Ser: 1.31 mg/dL — ABNORMAL HIGH (ref 0.61–1.24)
GFR calc Af Amer: 59 mL/min — ABNORMAL LOW (ref >60)
GFR calc non Af Amer: 51 mL/min — ABNORMAL LOW (ref >60)
Glucose, Bld: 95 mg/dL (ref 70–99)
Potassium: 3.9 mmol/L (ref 3.5–5.1)
Sodium: 134 mmol/L — ABNORMAL LOW (ref 135–145)

## 2020-01-11 MED ORDER — PIPERACILLIN-TAZOBACTAM 3.375 G IVPB
3.3750 g | Freq: Three times a day (TID) | INTRAVENOUS | Status: DC
  Administered 2020-01-11 – 2020-01-12 (×4): 3.375 g via INTRAVENOUS
  Filled 2020-01-11 (×4): qty 50

## 2020-01-11 NOTE — Progress Notes (Signed)
PROGRESS NOTE    Timothy Eaton  BTD:[id number] DOB: [birth year] DOA: 01/09/2020 PCP: [name], MD   Brief Narrative:  HPI: Timothy Eaton is a 79 y.o. male with medical history significant of hypertension, hyperlipidemia, COPD, GERD, CAD, atrial fibrillation not on anticoagulants, AAA, carotid artery stenosis, BPH, kidney stone, pyelonephritis, CKD stage III, who presents with nausea, vomiting and groin pain.  Pt recently had cystoscopy and left ureteroscopy w/ stone removal and stent exchange w/ urology on 01/05/20. Yesterday the stent was removed. Patient states he tolerated procedure well, but developed nausea vomiting and groin pain last night.  He has had 5-6 times of normal bloody nonbilious vomiting.  No abdominal pain or diarrhea.  Patient denies hematuria, dysuria or burning on urination.  He has mild pain in groin area.  Denies chest pain, cough, shortness breath.  No fever or chills.  Patient was lethargic earlier, which has resolved.  Currently patient is alert, oriented x3.  6/6: Patient seen and examined.  Hemodynamically stable but notably tremulous and shivering on my evaluation.  Afebrile however.  White count slightly worse from yesterday.  Kidney function slightly worse from yesterday.  CT abdomen pelvis did not show any obstruction.  There was some mild left ureteral inflammation consistent with possible recently passed stone versus urinary tract infection.  Remains on IV Rocephin.  6/7: Patient seen and examined.  Remains hemodynamically stable.  Tremulousness resolved.  Kidney function improving.  White count improving as well.  Urinalysis and urine culture significant for Pseudomonas aeruginosa.  Antibiotics changed.   Assessment & Plan:   Principal Problem:   Complicated UTI (urinary tract infection) Active Problems:   Hyperlipidemia   Essential hypertension   COPD (chronic obstructive pulmonary disease) with emphysema (HCC)   Coronary artery disease   Atrial  fibrillation (HCC)   BPH with obstruction/lower urinary tract symptoms   CKD (chronic kidney disease), stage IIIa  Complicated UTI (urinary tract infection):  No fevers or worsening leukocytosis since admission Symptoms likely secondary to recently passed stone versus residual infection in the setting of a recent instrumentation Previous urine culture demonstrating Enterococcus sensitive to ampicillin Urine culture from this admission growing Pseudomonas aeruginosa, sensitive to fluoroquinolones Plan: DC Rocephin DC ampicillin Start Zosyn 3.375 g every 8 hours If patient remains stable can transition to p.o. ciprofloxacin in preparation for discharge tomorrow Follow-up blood cultures APAP fever as needed Follow blood cultures  Hyperlipidemia -crestor  Essential hypertension: Blood pressure 123/69 -Continue Cardizem, metoprolol  COPD (chronic obstructive pulmonary disease) with emphysema ([place]): Stable -As needed albuterol  Coronary artery disease: No chest pain -Continue aspirin and Crestor  Atrial fibrillation ([place]): Patient is not taking anticoagulants currently -Continue Cardizem and metoprolol  BPH with obstruction/lower urinary tract symptoms -Flomax  CKD (chronic kidney disease), stage IIIa: Stable, close to baseline -Follow-up with BMP   DVT prophylaxis: Lovenox Code Status: Full Family Communication: None today Disposition Plan: Status is: Inpatient  Remains inpatient appropriate because:Inpatient level of care appropriate due to severity of illness   Dispo: The patient is from: Home              Anticipated d/c is to: Home              Anticipated d/c date is: 1 day              Patient currently is not medically stable to d/c.  Anticipate discharge tomorrow.  If blood cultures remain negative and patient remains clinically stable can  transition to p.o. ciprofloxacin and plan for discharge on 01/12/2020  Consultants:   None  Procedures:    None  Antimicrobials:   Rocephin, 01/09/2020-01/11/20  Ampicillin, 01/10/2020-01/11/20  Zosyn, 01/11/20-   Subjective: Seen and examined.  Tremulousness decreased.  No acute distress.  No complaints  Objective: Vitals:   01/10/20 1304 01/10/20 2031 01/11/20 0556 01/11/20 1013  BP:  116/69 112/67 (!) 106/57  Pulse:  77 74 70  Resp: 20 16 16    Temp:  97.6 F (36.4 C) 98.2 F (36.8 C)   TempSrc:  Oral Oral   SpO2:  (!) 87% 92%   Weight:      Height:        Intake/Output Summary (Last 24 hours) at 01/11/2020 1414 Last data filed at 01/11/2020 1025 Gross per 24 hour  Intake 2136.45 ml  Output 625 ml  Net 1511.45 ml   Filed Weights   01/09/20 1010  Weight: 70.2 kg    Examination:  General exam: Appears to be tremulous otherwise no acute distress Respiratory system: Clear to auscultation. Respiratory effort normal. Cardiovascular system: S1 & S2 heard, RRR. No JVD, murmurs, rubs, gallops or clicks. No pedal edema. Gastrointestinal system: Abdomen is nondistended, soft and nontender. No organomegaly or masses felt. Normal bowel sounds heard. Central nervous system: Alert and oriented. No focal neurological deficits. Extremities: Symmetric 5 x 5 power. Skin: No rashes, lesions or ulcers Psychiatry: Judgement and insight appear normal. Mood & affect appropriate.     Data Reviewed: I have personally reviewed following labs and imaging studies  CBC: Recent Labs  Lab 01/09/20 1006 01/10/20 0618 01/11/20 0800  WBC 6.2 10.3 7.2  NEUTROABS  --   --  5.6  HGB 14.0 13.0 12.6*  HCT 42.0 40.0 37.1*  MCV 92.1 93.0 90.5  PLT 121* 116* 250*   Basic Metabolic Panel: Recent Labs  Lab 01/09/20 1006 01/10/20 0618 01/11/20 0800  NA 139 137 134*  K 3.8 4.4 3.9  CL 107 106 104  CO2 24 27 22   GLUCOSE 125* 110* 95  BUN 22 26* 28*  CREATININE 1.34* 1.40* 1.31*  CALCIUM 8.4* 8.1* 7.9*   GFR: Estimated Creatinine Clearance: 44.7 mL/min (A) (by C-G formula based on SCr of 1.31  mg/dL (H)). Liver Function Tests: Recent Labs  Lab 01/09/20 1006  AST 37  ALT 21  ALKPHOS 61  BILITOT 1.0  PROT 5.7*  ALBUMIN 3.3*   Recent Labs  Lab 01/09/20 1006  LIPASE 51   No results for input(s): AMMONIA in the last 168 hours. Coagulation Profile: No results for input(s): INR, PROTIME in the last 168 hours. Cardiac Enzymes: No results for input(s): CKTOTAL, CKMB, CKMBINDEX, TROPONINI in the last 168 hours. BNP (last 3 results) No results for input(s): PROBNP in the last 8760 hours. HbA1C: No results for input(s): HGBA1C in the last 72 hours. CBG: No results for input(s): GLUCAP in the last 168 hours. Lipid Profile: No results for input(s): CHOL, HDL, LDLCALC, TRIG, CHOLHDL, LDLDIRECT in the last 72 hours. Thyroid Function Tests: No results for input(s): TSH, T4TOTAL, FREET4, T3FREE, THYROIDAB in the last 72 hours. Anemia Panel: No results for input(s): VITAMINB12, FOLATE, FERRITIN, TIBC, IRON, RETICCTPCT in the last 72 hours. Sepsis Labs: No results for input(s): PROCALCITON, LATICACIDVEN in the last 168 hours.  Recent Results (from the past 240 hour(s))  Urine Culture     Status: Abnormal   Collection Time: 01/09/20 10:53 AM   Specimen: Urine, Random  Result Value Ref Range  Status   Specimen Description   Final    URINE, RANDOM Performed at [facility], [address] [place], [place] [address]    Special Requests   Final    NONE Performed at [facility], [place], [place] [address]    Culture >=100,000 COLONIES/mL PSEUDOMONAS AERUGINOSA (A)  Final   Report Status 01/11/2020 FINAL  Final   Organism ID, Bacteria PSEUDOMONAS AERUGINOSA (A)  Final      Susceptibility   Pseudomonas aeruginosa - MIC*    CEFTAZIDIME <=1 SENSITIVE Sensitive     CIPROFLOXACIN <=0.25 SENSITIVE Sensitive     GENTAMICIN <=1 SENSITIVE Sensitive     IMIPENEM 1 SENSITIVE Sensitive     PIP/TAZO <=4 SENSITIVE Sensitive     CEFEPIME <=1 SENSITIVE  Sensitive     * >=100,000 COLONIES/mL PSEUDOMONAS AERUGINOSA  CULTURE, BLOOD (ROUTINE X 2) w Reflex to ID Panel     Status: None (Preliminary result)   Collection Time: 01/09/20  1:20 PM   Specimen: BLOOD  Result Value Ref Range Status   Specimen Description BLOOD L AC  Final   Special Requests   Final    BOTTLES DRAWN AEROBIC AND ANAEROBIC Blood Culture results may not be optimal due to an inadequate volume of blood received in culture bottles   Culture   Final    NO GROWTH 2 DAYS Performed at [facility], [address], [place], [place] [address]    Report Status PENDING  Incomplete  CULTURE, BLOOD (ROUTINE X 2) w Reflex to ID Panel     Status: None (Preliminary result)   Collection Time: 01/09/20  1:21 PM   Specimen: BLOOD  Result Value Ref Range Status   Specimen Description BLOOD RAC  Final   Special Requests   Final    BOTTLES DRAWN AEROBIC AND ANAEROBIC Blood Culture adequate volume   Culture   Final    NO GROWTH 2 DAYS Performed at [facility], [address], [place], [place] [address]    Report Status PENDING  Incomplete  SARS Coronavirus 2 by RT PCR (hospital order, performed in [facility] hospital lab) Nasopharyngeal Nasopharyngeal Swab     Status: None   Collection Time: 01/09/20  1:29 PM   Specimen: Nasopharyngeal Swab  Result Value Ref Range Status   SARS Coronavirus 2 NEGATIVE NEGATIVE Final    Comment: (NOTE) SARS-CoV-2 target nucleic acids are NOT DETECTED. The SARS-CoV-2 RNA is generally detectable in upper and lower respiratory specimens during the acute phase of infection. The lowest concentration of SARS-CoV-2 viral copies this assay can detect is 250 copies / mL. A negative result does not preclude SARS-CoV-2 infection and should not be used as the sole basis for treatment or other patient management decisions.  A negative result may occur with improper specimen collection / handling, submission of specimen other than  nasopharyngeal swab, presence of viral mutation(s) within the areas targeted by this assay, and inadequate number of viral copies (<250 copies / mL). A negative result must be combined with clinical observations, patient history, and epidemiological information. Fact Sheet for Patients:   StrictlyIdeas.no Fact Sheet for Healthcare Providers: BankingDealers.co.za This test is not yet approved or cleared  by the [place] FDA and has been authorized for detection and/or diagnosis of SARS-CoV-2 by FDA under an Emergency Use Authorization (EUA).  This EUA will remain in effect (meaning this test can be used) for the duration of the COVID-19 declaration under Section 564(b)(1)  of the Act, 21 U.S.C. section 360bbb-3(b)(1), unless the authorization is terminated or revoked sooner. Performed at [facility], [address] [place], [place] [address]          Radiology Studies: No results found.      Scheduled Meds: . aspirin EC  81 mg Oral Daily  . diltiazem  240 mg Oral BH-q7a  . enoxaparin (LOVENOX) injection  40 mg Subcutaneous Q24H  . metoprolol tartrate  25 mg Oral BID  . rosuvastatin  20 mg Oral q1800  . senna-docusate  1 tablet Oral BID  . tamsulosin  0.4 mg Oral QPC supper   Continuous Infusions: . sodium chloride 100 mL/hr at 01/11/20 1414  . piperacillin-tazobactam (ZOSYN)  IV 3.375 g (01/11/20 1407)     LOS: 1 day    Time spent: 35 minutes    Timothy Ace, MD [facility] Pager [phone]  If 7PM-7AM, please contact night-coverage 01/11/2020, 2:14 PM

## 2020-01-11 NOTE — Progress Notes (Signed)
Pharmacy Antibiotic Note  Timothy Eaton is a 79 y.o. male admitted on 01/09/2020 with UTI.  Pharmacy has been consulted for Zosyn dosing.  Plan: Zosyn 3.375g IV q8h (4 hour infusion).  Height: 5\' 10"  (177.8 cm) Weight: 70.2 kg (154 lb 12.8 oz) IBW/kg (Calculated) : 73  Temp (24hrs), Avg:98.5 F (36.9 C), Min:97.6 F (36.4 C), Max:99.6 F (37.6 C)  Recent Labs  Lab 01/09/20 1006 01/10/20 0618  WBC 6.2 10.3  CREATININE 1.34* 1.40*    Estimated Creatinine Clearance: 41.8 mL/min (A) (by C-G formula based on SCr of 1.4 mg/dL (H)).    Allergies  Allergen Reactions   Sulfamethoxazole-Trimethoprim Rash    Antimicrobials this admission: CTX 6/5 >>6/7 Ampicillin 6/6 >> 6/7 Zosyn 6/7 >>  Dose adjustments this admission:   Microbiology results: 6/5 BCx: NGTD x2 6/5 UCx: P. aeruginosa   Thank you for allowing pharmacy to be a part of this patients care.  Rocky Morel 01/11/2020 8:21 AM

## 2020-01-12 ENCOUNTER — Telehealth: Payer: Self-pay

## 2020-01-12 LAB — CALCULI, WITH PHOTOGRAPH (CLINICAL LAB)
Calcium Oxalate Monohydrate: 100 %
Weight Calculi: 79 mg

## 2020-01-12 LAB — BASIC METABOLIC PANEL
Anion gap: 6 (ref 5–15)
BUN: 20 mg/dL (ref 8–23)
CO2: 24 mmol/L (ref 22–32)
Calcium: 8 mg/dL — ABNORMAL LOW (ref 8.9–10.3)
Chloride: 105 mmol/L (ref 98–111)
Creatinine, Ser: 1.27 mg/dL — ABNORMAL HIGH (ref 0.61–1.24)
GFR calc Af Amer: >60 mL/min (ref >60)
GFR calc non Af Amer: 53 mL/min — ABNORMAL LOW (ref >60)
Glucose, Bld: 111 mg/dL — ABNORMAL HIGH (ref 70–99)
Potassium: 3.7 mmol/L (ref 3.5–5.1)
Sodium: 135 mmol/L (ref 135–145)

## 2020-01-12 LAB — CBC WITH DIFFERENTIAL/PLATELET
Abs Immature Granulocytes: 0.03 10*3/uL (ref 0.00–0.07)
Basophils Absolute: 0 10*3/uL (ref 0.0–0.1)
Basophils Relative: 0 %
Eosinophils Absolute: 0.2 10*3/uL (ref 0.0–0.5)
Eosinophils Relative: 3 %
HCT: 39.8 % (ref 39.0–52.0)
Hemoglobin: 13.3 g/dL (ref 13.0–17.0)
Immature Granulocytes: 1 %
Lymphocytes Relative: 10 %
Lymphs Abs: 0.5 10*3/uL — ABNORMAL LOW (ref 0.7–4.0)
MCH: 30.4 pg (ref 26.0–34.0)
MCHC: 33.4 g/dL (ref 30.0–36.0)
MCV: 90.9 fL (ref 80.0–100.0)
Monocytes Absolute: 0.7 10*3/uL (ref 0.1–1.0)
Monocytes Relative: 13 %
Neutro Abs: 3.9 10*3/uL (ref 1.7–7.7)
Neutrophils Relative %: 73 %
Platelets: 119 10*3/uL — ABNORMAL LOW (ref 150–400)
RBC: 4.38 MIL/uL (ref 4.22–5.81)
RDW: 12.9 % (ref 11.5–15.5)
WBC: 5.3 10*3/uL (ref 4.0–10.5)
nRBC: 0 % (ref 0.0–0.2)

## 2020-01-12 MED ORDER — CIPROFLOXACIN HCL 500 MG PO TABS
500.0000 mg | ORAL_TABLET | Freq: Two times a day (BID) | ORAL | 0 refills | Status: AC
Start: 2020-01-12 — End: 2020-01-24

## 2020-01-12 NOTE — Discharge Summary (Signed)
Physician Discharge Summary  JUSHUA WALTMAN ZOX:[id number] DOB: 1941-06-30 DOA: 01/09/2020  PCP: [name], MD  Admit date: 01/09/2020 Discharge date: 01/12/2020  Admitted From: Home Disposition: Home  Recommendations for Outpatient Follow-up:  1. Follow up with PCP in 1-2 weeks 2. Follow-up with urology as directed  Home Health: No Equipment/Devices: None  Discharge Condition: Stable CODE STATUS: Full Diet recommendation: Heart Healthy / Carb Modified Brief/Interim Summary: WJX:BJYNWG M Hallis a 79 y.o.malewith medical history significant ofhypertension, hyperlipidemia, COPD, GERD, CAD, atrial fibrillation not on anticoagulants, AAA, carotid artery stenosis, BPH, kidney stone, pyelonephritis, CKD stage III, who presents with nausea, vomiting and groin pain.  Pt recently hadcystoscopy and left ureteroscopy w/ stone removal and stent exchange w/ urology on 01/05/20. Yesterdaythe stent wasremoved.Patient states he tolerated procedure well, butdeveloped nausea vomiting and groin pain last night. He has had 5-6 times of normal bloody nonbilious vomiting. No abdominal pain or diarrhea. Patient denies hematuria, dysuria or burning on urination. He has mild pain in groin area. Denies chest pain, cough, shortness breath. No fever or chills. Patient was lethargic earlier, which has resolved. Currently patient is alert, oriented x3.  6/6: Patient seen and examined.  Hemodynamically stable but notably tremulous and shivering on my evaluation.  Afebrile however.  White count slightly worse from yesterday.  Kidney function slightly worse from yesterday.  CT abdomen pelvis did not show any obstruction.  There was some mild left ureteral inflammation consistent with possible recently passed stone versus urinary tract infection.  Remains on IV Rocephin.  6/7: Patient seen and examined.  Remains hemodynamically stable.  Tremulousness resolved.  Kidney function improving.  White count  improving as well.  Urinalysis and urine culture significant for Pseudomonas aeruginosa.  Antibiotics changed.  6/8: Patient seen and examined the day of discharge.  No distress.  Feels well.  Daughter at bedside.  Kidney function baseline.  Urinating without difficulty.  No fevers noted.  Transition to p.o. ciprofloxacin.  Will prescribe total 95-AOZ course for complicated urinary tract infection.  Discharge Diagnoses:  Principal Problem:   Complicated UTI (urinary tract infection) Active Problems:   Hyperlipidemia   Essential hypertension   COPD (chronic obstructive pulmonary disease) with emphysema (HCC)   Coronary artery disease   Atrial fibrillation (HCC)   BPH with obstruction/lower urinary tract symptoms   CKD (chronic kidney disease), stage IIIa   Complicated UTI (urinary tract infection): No fevers or worsening leukocytosis since admission Symptoms likely secondary to recently passed stone versus residual infection in the setting of a recent instrumentation Previous urine culture demonstrating Enterococcus sensitive to ampicillin Urine culture from this admission growing Pseudomonas aeruginosa, sensitive to fluoroquinolones Prescribed ciprofloxacin on discharge Plan for total 14-day antibiotic course Follow-up outpatient PCP and urology   Hyperlipidemia -crestor  Essential hypertension:Blood pressure 123/69 -Continue Cardizem, metoprolol  COPD (chronic obstructive pulmonary disease) with emphysema ([place]): Stable -As needed albuterol  Coronary artery disease:No chest pain -Continue aspirin and Crestor  Atrial fibrillation ([place]): Patient is not taking anticoagulants currently -Continue Cardizem and metoprolol  BPH with obstruction/lower urinary tract symptoms -Flomax  CKD (chronic kidney disease), stage IIIa:Stable,close to baseline -Follow-up with BMP   Discharge Instructions  Discharge Instructions    Diet - low sodium heart healthy   Complete  by: As directed    Increase activity slowly   Complete by: As directed    No wound care   Complete by: As directed      Allergies as of 01/12/2020  Reactions   Sulfamethoxazole-trimethoprim Rash      Medication List    TAKE these medications   aspirin 81 MG EC tablet Take 81 mg by mouth daily.   ciprofloxacin 500 MG tablet Commonly known as: Cipro Take 1 tablet (500 mg total) by mouth 2 (two) times daily for 12 days.   diltiazem 240 MG 24 hr capsule Commonly known as: CARDIZEM CD TAKE 1 CAPSULE BY MOUTH EVERY DAY What changed:   how much to take  when to take this   metoprolol tartrate 25 MG tablet Commonly known as: LOPRESSOR TAKE 1 TABLET BY MOUTH TWICE DAILY. PLEASE KEEP APPOINTMENT IN JULY WITH DOCTOR NAHSER BFOR FUTURE REFILLS What changed: See the new instructions.   rosuvastatin 20 MG tablet Commonly known as: CRESTOR TAKE 1 TABLET BY MOUTH ONCE DAILY What changed: when to take this   tamsulosin 0.4 MG Caps capsule Commonly known as: FLOMAX Take 1 capsule (0.4 mg total) by mouth daily. What changed: when to take this   Uribel 118 MG Caps Take 1 capsule (118 mg total) by mouth 3 (three) times daily as needed (Urinary frequency, urgency, burning).      Follow-up Information    [name], MD. Go on 01/19/2020.   Specialties: Internal Medicine, Pediatrics Why: 10am checkin in time appointment time is 10:15 Contact information: [place] [address] [phone]          Allergies  Allergen Reactions  . Sulfamethoxazole-Trimethoprim Rash    Consultations:  None   Procedures/Studies: CT Abdomen Pelvis W Contrast  Result Date: 01/09/2020 CLINICAL DATA:  Post cystoscopy and ureteral stent placement on 01/05/2020 for nephrolithiasis, stent removal 01/08/2020, now with vomiting, increased confusion, history of abdominal aortic aneurysm post repair, atrial fibrillation, COPD, coronary artery disease,  hypertension, GERD EXAM: CT ABDOMEN AND PELVIS WITH CONTRAST TECHNIQUE: Multidetector CT imaging of the abdomen and pelvis was performed using the standard protocol following bolus administration of intravenous contrast. Sagittal and coronal MPR images reconstructed from axial data set. CONTRAST:  132mL OMNIPAQUE IOHEXOL 300 MG/ML SOLN IV. No oral contrast administered. COMPARISON:  12/11/2019, 04/19/2011 FINDINGS: Lower chest: Lung bases emphysematous with LEFT lower lobe nodule again identified 8 mm diameter image 13 unchanged since 04/19/2011. Additional questionable 9 mm non nodular density at RIGHT diaphragm image 13 unchanged since 04/19/2011. Hepatobiliary: Gallbladder surgically absent. Few tiny cysts within the LEFT lobe liver. Liver otherwise unremarkable. Pancreas: Normal appearance Spleen: Normal appearance Adrenals/Urinary Tract: Adrenal glands normal appearance. BILATERAL renal cysts. Additional mildly hyperdense lesions within the kidneys including 19 x 17 mm nodule anterior upper pole LEFT kidney image 25 and 18 x 19 mm anterior upper to mid RIGHT renal lesion image 27 unchanged. Tiny nonobstructing RIGHT renal calculus. Diffuse wall thickening and mild dilatation of LEFT ureter throughout its course to the bladder with minimal associated periureteral infiltration, cannot exclude passed calculus or urinary tract infection. No ureteral calcifications seen. RIGHT ureter nondilated. Tiny amount of air within urinary bladder question instrumentation/catheterization. Stomach/Bowel: Duodenal diverticulum noted. Small hiatal hernia. Scattered colonic diverticulosis greatest at sigmoid colon. Normal appendix. Stomach and bowel loops otherwise unremarkable. Vascular/Lymphatic: Atherosclerotic calcifications aorta, iliac arteries, visceral artery origins, and coronary arteries. Prior distal abdominal aortic aneurysm repair. No adenopathy. Reproductive: Prostatic enlargement with gland 5.6 x 4.8 x 5.1 cm  (volume = 72 cm^3) Other: No free air or free fluid. No hernia or inflammatory process. Musculoskeletal: Osseous demineralization. Scattered degenerative disc disease changes lumbar spine and Schmorl's nodes.  IMPRESSION: Diffuse wall thickening and mild dilatation of LEFT ureter throughout its course to the bladder with minimal associated periureteral infiltration, cannot exclude passed calculus or urinary tract infection, recommend correlation with urinalysis. Tiny nonobstructing RIGHT renal calculus. BILATERAL renal cysts with additional mildly hyperdense lesions within the kidneys, question hyperdense cysts, stable. Prostatic enlargement. Colonic diverticulosis without evidence of diverticulitis. Small hiatal hernia. Stable LEFT lower lobe nodule and questionable non nodular density at RIGHT diaphragm. Prior abdominal aortic aneurysm repair. Aortic Atherosclerosis (ICD10-I70.0) and Emphysema (ICD10-J43.9). Electronically Signed   By: Lavonia Dana M.D.   On: 01/09/2020 12:34   DG OR UROLOGY CYSTO IMAGE ([facility] ONLY)  Result Date: 01/05/2020 There is no interpretation for this exam.  This order is for images obtained during a surgical procedure.  Please See "Surgeries" Tab for more information regarding the procedure.    (Echo, Carotid, EGD, Colonoscopy, ERCP)    Subjective: Patient seen and examined.  No acute distress.  Daughter at bedside.  Discharge Exam: Vitals:   01/12/20 0422 01/12/20 0927  BP: 114/65 93/68  Pulse: 95   Resp: 17   Temp: 98 F (36.7 C)   SpO2: 92%    Vitals:   01/11/20 1524 01/11/20 1943 01/12/20 0422 01/12/20 0927  BP: (!) 109/50 139/73 114/65 93/68  Pulse: 67 81 95   Resp: 18 18 17    Temp: 98.7 F (37.1 C) 97.8 F (36.6 C) 98 F (36.7 C)   TempSrc: Oral Oral Oral   SpO2: 92% (!) 88% 92%   Weight:      Height:        General: Pt is alert, awake, not in acute distress Cardiovascular: RRR, S1/S2 +, no rubs, no gallops Respiratory: CTA bilaterally, no  wheezing, no rhonchi Abdominal: Soft, NT, ND, bowel sounds + Extremities: no edema, no cyanosis    The results of significant diagnostics from this hospitalization (including imaging, microbiology, ancillary and laboratory) are listed below for reference.     Microbiology: Recent Results (from the past 240 hour(s))  Urine Culture     Status: Abnormal   Collection Time: 01/09/20 10:53 AM   Specimen: Urine, Random  Result Value Ref Range Status   Specimen Description   Final    URINE, RANDOM Performed at [facility], [address] [place], [place] [address]    Special Requests   Final    NONE Performed at [facility], [place], [place] [address]    Culture >=100,000 COLONIES/mL PSEUDOMONAS AERUGINOSA (A)  Final   Report Status 01/11/2020 FINAL  Final   Organism ID, Bacteria PSEUDOMONAS AERUGINOSA (A)  Final      Susceptibility   Pseudomonas aeruginosa - MIC*    CEFTAZIDIME <=1 SENSITIVE Sensitive     CIPROFLOXACIN <=0.25 SENSITIVE Sensitive     GENTAMICIN <=1 SENSITIVE Sensitive     IMIPENEM 1 SENSITIVE Sensitive     PIP/TAZO <=4 SENSITIVE Sensitive     CEFEPIME <=1 SENSITIVE Sensitive     * >=100,000 COLONIES/mL PSEUDOMONAS AERUGINOSA  CULTURE, BLOOD (ROUTINE X 2) w Reflex to ID Panel     Status: None (Preliminary result)   Collection Time: 01/09/20  1:20 PM   Specimen: BLOOD  Result Value Ref Range Status   Specimen Description BLOOD L AC  Final   Special Requests   Final    BOTTLES DRAWN AEROBIC AND ANAEROBIC Blood Culture results may not be optimal due to an inadequate volume of blood received in culture bottles  Culture   Final    NO GROWTH 3 DAYS Performed at [facility], [address] [place], [place] [address]    Report Status PENDING  Incomplete  CULTURE, BLOOD (ROUTINE X 2) w Reflex to ID Panel     Status: None (Preliminary result)   Collection Time: 01/09/20  1:21 PM   Specimen: BLOOD  Result Value Ref  Range Status   Specimen Description BLOOD RAC  Final   Special Requests   Final    BOTTLES DRAWN AEROBIC AND ANAEROBIC Blood Culture adequate volume   Culture   Final    NO GROWTH 3 DAYS Performed at [facility], [address] [place], [place] [address]    Report Status PENDING  Incomplete  SARS Coronavirus 2 by RT PCR (hospital order, performed in [facility] hospital lab) Nasopharyngeal Nasopharyngeal Swab     Status: None   Collection Time: 01/09/20  1:29 PM   Specimen: Nasopharyngeal Swab  Result Value Ref Range Status   SARS Coronavirus 2 NEGATIVE NEGATIVE Final    Comment: (NOTE) SARS-CoV-2 target nucleic acids are NOT DETECTED. The SARS-CoV-2 RNA is generally detectable in upper and lower respiratory specimens during the acute phase of infection. The lowest concentration of SARS-CoV-2 viral copies this assay can detect is 250 copies / mL. A negative result does not preclude SARS-CoV-2 infection and should not be used as the sole basis for treatment or other patient management decisions.  A negative result may occur with improper specimen collection / handling, submission of specimen other than nasopharyngeal swab, presence of viral mutation(s) within the areas targeted by this assay, and inadequate number of viral copies (<250 copies / mL). A negative result must be combined with clinical observations, patient history, and epidemiological information. Fact Sheet for Patients:   StrictlyIdeas.no Fact Sheet for Healthcare Providers: BankingDealers.co.za This test is not yet approved or cleared  by the [place] FDA and has been authorized for detection and/or diagnosis of SARS-CoV-2 by FDA under an Emergency Use Authorization (EUA).  This EUA will remain in effect (meaning this test can be used) for the duration of the COVID-19 declaration under Section 564(b)(1) of the Act, 21 U.S.C. section 360bbb-3(b)(1),  unless the authorization is terminated or revoked sooner. Performed at [facility], [address] [place], [place] [address]      Labs: BNP (last 3 results) No results for input(s): BNP in the last 8760 hours. Basic Metabolic Panel: Recent Labs  Lab 01/09/20 1006 01/10/20 0618 01/11/20 0800 01/12/20 0437  NA 139 137 134* 135  K 3.8 4.4 3.9 3.7  CL 107 106 104 105  CO2 24 27 22 24   GLUCOSE 125* 110* 95 111*  BUN 22 26* 28* 20  CREATININE 1.34* 1.40* 1.31* 1.27*  CALCIUM 8.4* 8.1* 7.9* 8.0*   Liver Function Tests: Recent Labs  Lab 01/09/20 1006  AST 37  ALT 21  ALKPHOS 61  BILITOT 1.0  PROT 5.7*  ALBUMIN 3.3*   Recent Labs  Lab 01/09/20 1006  LIPASE 51   No results for input(s): AMMONIA in the last 168 hours. CBC: Recent Labs  Lab 01/09/20 1006 01/10/20 0618 01/11/20 0800 01/12/20 0437  WBC 6.2 10.3 7.2 5.3  NEUTROABS  --   --  5.6 3.9  HGB 14.0 13.0 12.6* 13.3  HCT 42.0 40.0 37.1* 39.8  MCV 92.1 93.0 90.5 90.9  PLT 121* 116* 102* 119*   Cardiac Enzymes: No results for input(s): CKTOTAL, CKMB, CKMBINDEX, TROPONINI  in the last 168 hours. BNP: Invalid input(s): POCBNP CBG: No results for input(s): GLUCAP in the last 168 hours. D-Dimer No results for input(s): DDIMER in the last 72 hours. Hgb A1c No results for input(s): HGBA1C in the last 72 hours. Lipid Profile No results for input(s): CHOL, HDL, LDLCALC, TRIG, CHOLHDL, LDLDIRECT in the last 72 hours. Thyroid function studies No results for input(s): TSH, T4TOTAL, T3FREE, THYROIDAB in the last 72 hours.  Invalid input(s): FREET3 Anemia work up No results for input(s): VITAMINB12, FOLATE, FERRITIN, TIBC, IRON, RETICCTPCT in the last 72 hours. Urinalysis    Component Value Date/Time   COLORURINE YELLOW (A) 01/09/2020 1053   APPEARANCEUR CLOUDY (A) 01/09/2020 1053   APPEARANCEUR Cloudy (A) 12/17/2019 1329   LABSPEC 1.013 01/09/2020 1053   PHURINE 7.0 01/09/2020 1053   GLUCOSEU  NEGATIVE 01/09/2020 1053   HGBUR LARGE (A) 01/09/2020 1053   BILIRUBINUR NEGATIVE 01/09/2020 1053   BILIRUBINUR Negative 12/17/2019 1329   KETONESUR 5 (A) 01/09/2020 1053   PROTEINUR 30 (A) 01/09/2020 1053   UROBILINOGEN 1.0 12/09/2019 1416   NITRITE POSITIVE (A) 01/09/2020 1053   LEUKOCYTESUR LARGE (A) 01/09/2020 1053   Sepsis Labs Invalid input(s): PROCALCITONIN,  WBC,  LACTICIDVEN Microbiology Recent Results (from the past 240 hour(s))  Urine Culture     Status: Abnormal   Collection Time: 01/09/20 10:53 AM   Specimen: Urine, Random  Result Value Ref Range Status   Specimen Description   Final    URINE, RANDOM Performed at [facility], [address] [place], [place] [address]    Special Requests   Final    NONE Performed at [facility], [address] [place], [place] [address]    Culture >=100,000 COLONIES/mL PSEUDOMONAS AERUGINOSA (A)  Final   Report Status 01/11/2020 FINAL  Final   Organism ID, Bacteria PSEUDOMONAS AERUGINOSA (A)  Final      Susceptibility   Pseudomonas aeruginosa - MIC*    CEFTAZIDIME <=1 SENSITIVE Sensitive     CIPROFLOXACIN <=0.25 SENSITIVE Sensitive     GENTAMICIN <=1 SENSITIVE Sensitive     IMIPENEM 1 SENSITIVE Sensitive     PIP/TAZO <=4 SENSITIVE Sensitive     CEFEPIME <=1 SENSITIVE Sensitive     * >=100,000 COLONIES/mL PSEUDOMONAS AERUGINOSA  CULTURE, BLOOD (ROUTINE X 2) w Reflex to ID Panel     Status: None (Preliminary result)   Collection Time: 01/09/20  1:20 PM   Specimen: BLOOD  Result Value Ref Range Status   Specimen Description BLOOD L AC  Final   Special Requests   Final    BOTTLES DRAWN AEROBIC AND ANAEROBIC Blood Culture results may not be optimal due to an inadequate volume of blood received in culture bottles   Culture   Final    NO GROWTH 3 DAYS Performed at [facility], [address] [place], [place] [address]    Report Status PENDING  Incomplete  CULTURE, BLOOD (ROUTINE X 2) w Reflex  to ID Panel     Status: None (Preliminary result)   Collection Time: 01/09/20  1:21 PM   Specimen: BLOOD  Result Value Ref Range Status   Specimen Description BLOOD RAC  Final   Special Requests   Final    BOTTLES DRAWN AEROBIC AND ANAEROBIC Blood Culture adequate volume   Culture   Final    NO GROWTH 3 DAYS Performed at [facility], [address] [place],  [address]    Report Status PENDING  Incomplete  SARS Coronavirus 2 by RT PCR (hospital order, performed in [facility] hospital lab) Nasopharyngeal Nasopharyngeal Swab     Status: None   Collection Time: 01/09/20  1:29 PM   Specimen: Nasopharyngeal Swab  Result Value Ref Range Status   SARS Coronavirus 2 NEGATIVE NEGATIVE Final    Comment: (NOTE) SARS-CoV-2 target nucleic acids are NOT DETECTED. The SARS-CoV-2 RNA is generally detectable in upper and lower respiratory specimens during the acute phase of infection. The lowest concentration of SARS-CoV-2 viral copies this assay can detect is 250 copies / mL. A negative result does not preclude SARS-CoV-2 infection and should not be used as the sole basis for treatment or other patient management decisions.  A negative result may occur with improper specimen collection / handling, submission of specimen other than nasopharyngeal swab, presence of viral mutation(s) within the areas targeted by this assay, and inadequate number of viral copies (<250 copies / mL). A negative result must be combined with clinical observations, patient history, and epidemiological information. Fact Sheet for Patients:   StrictlyIdeas.no Fact Sheet for Healthcare Providers: BankingDealers.co.za This test is not yet approved or cleared  by the [place] FDA and has been authorized for detection and/or diagnosis of SARS-CoV-2 by FDA under an Emergency Use Authorization (EUA).  This EUA will remain in effect (meaning this test can be  used) for the duration of the COVID-19 declaration under Section 564(b)(1) of the Act, 21 U.S.C. section 360bbb-3(b)(1), unless the authorization is terminated or revoked sooner. Performed at [facility], [address] [place], [place] [address]      Time coordinating discharge: Over 30 minutes  SIGNED:   Sidney Ace, MD  [facility] 01/12/2020, 1:40 PM Pager   If 7PM-7AM, please contact night-coverage

## 2020-01-12 NOTE — Telephone Encounter (Signed)
1st attempt- Left HIPAA complaint message on voicemail to return my call- need to complete TCM. Follow up scheduled 01/19/2020 @ 10:15 am.

## 2020-01-12 NOTE — Progress Notes (Signed)
Timothy Eaton A and O x4. VSS. Pt tolerating diet well. No complaints of nausea or vomiting. IV removed intact, prescriptions given. Pt voices understanding of discharge instructions with no further questions. Patient discharged via wheelchair with NT  Allergies as of 01/12/2020      Reactions   Sulfamethoxazole-trimethoprim Rash      Medication List    TAKE these medications   aspirin 81 MG EC tablet Take 81 mg by mouth daily.   ciprofloxacin 500 MG tablet Commonly known as: Cipro Take 1 tablet (500 mg total) by mouth 2 (two) times daily for 12 days.   diltiazem 240 MG 24 hr capsule Commonly known as: CARDIZEM CD TAKE 1 CAPSULE BY MOUTH EVERY DAY What changed:   how much to take  when to take this   metoprolol tartrate 25 MG tablet Commonly known as: LOPRESSOR TAKE 1 TABLET BY MOUTH TWICE DAILY. PLEASE KEEP APPOINTMENT IN JULY WITH DOCTOR NAHSER BFOR FUTURE REFILLS What changed: See the new instructions.   rosuvastatin 20 MG tablet Commonly known as: CRESTOR TAKE 1 TABLET BY MOUTH ONCE DAILY What changed: when to take this   tamsulosin 0.4 MG Caps capsule Commonly known as: FLOMAX Take 1 capsule (0.4 mg total) by mouth daily. What changed: when to take this   Uribel 118 MG Caps Take 1 capsule (118 mg total) by mouth 3 (three) times daily as needed (Urinary frequency, urgency, burning).       Vitals:   01/12/20 0422 01/12/20 0927  BP: 114/65 93/68  Pulse: 95   Resp: 17   Temp: 98 F (36.7 C)   SpO2: 92%     Timothy Eaton

## 2020-01-13 ENCOUNTER — Encounter: Payer: Medicare Other | Admitting: Urology

## 2020-01-13 NOTE — Telephone Encounter (Signed)
Transition Care Management Follow-up Telephone Call  Date of discharge and from where: 01/12/2020, [facility]   How have you been since you were released from the hospital?Spoke with Marlowe Kays (wife), Patient is doing okay. He is very irritable, not eating well and very disappointed in the care he received at the hospital.    Any questions or concerns? No   Items Reviewed:  Did the pt receive and understand the discharge instructions provided? Yes   Medications obtained and verified? Yes   Any new allergies since your discharge? No   Dietary orders reviewed? Yes  Do you have support at home? Yes   Functional Questionnaire: (I = Independent and D = Dependent) ADLs: I  Bathing/Dressing- I  Meal Prep- I  Eating- I  Maintaining continence- I  Transferring/Ambulation- I  Managing Meds- I  Follow up appointments reviewed:   PCP Hospital f/u appt confirmed? Yes  Scheduled to see Dr. Silvio Pate on 01/19/2020 @ 10:15 am.  [facility] f/u appt confirmed? No    Are transportation arrangements needed? No   If their condition worsens, is the pt aware to call PCP or go to the Emergency Dept.? Yes  Was the patient provided with contact information for the PCP's office or ED? Yes  Was to pt encouraged to call back with questions or concerns? Yes

## 2020-01-13 NOTE — Telephone Encounter (Signed)
2nd/final attempt- Left HIPAA complaint message on voicemail to return my call- need to complete TCM. Follow up scheduled 01/19/2020 @ 10:15 am

## 2020-01-13 NOTE — Telephone Encounter (Signed)
Noted I spoke to him yesterday in the hospital--so expected his discharge

## 2020-01-14 LAB — CULTURE, BLOOD (ROUTINE X 2)
Culture: NO GROWTH
Culture: NO GROWTH
Special Requests: ADEQUATE

## 2020-01-18 ENCOUNTER — Other Ambulatory Visit: Payer: Self-pay | Admitting: *Deleted

## 2020-01-18 MED ORDER — METOPROLOL TARTRATE 25 MG PO TABS: 25.0000 mg | ORAL_TABLET | Freq: Two times a day (BID) | ORAL | 0 refills | Status: DC

## 2020-01-18 MED ORDER — DILTIAZEM HCL ER COATED BEADS 240 MG PO CP24: 240.0000 mg | ORAL_CAPSULE | Freq: Every day | ORAL | 0 refills | Status: DC

## 2020-01-19 ENCOUNTER — Encounter: Payer: Self-pay | Admitting: Internal Medicine

## 2020-01-19 ENCOUNTER — Other Ambulatory Visit: Payer: Self-pay

## 2020-01-19 ENCOUNTER — Ambulatory Visit (INDEPENDENT_AMBULATORY_CARE_PROVIDER_SITE_OTHER): Payer: Medicare Other | Admitting: Internal Medicine

## 2020-01-19 DIAGNOSIS — N10 Acute tubulo-interstitial nephritis: Secondary | ICD-10-CM | POA: Diagnosis not present

## 2020-01-19 NOTE — Assessment & Plan Note (Signed)
Related to stent and instrumentation Pseudomonas now Finishing out cipro (2 week course) Blood cultures negative Discussed slow resumption of normal activity (since all systemic symptoms are gone)

## 2020-01-19 NOTE — Progress Notes (Signed)
Subjective:    Patient ID: Timothy Eaton, male    DOB: [birth year], 79 y.o.   MRN: [id number]  HPI Here for hospital follow up This visit occurred during the SARS-CoV-2 public health emergency.  Safety protocols were in place, including screening questions prior to the visit, additional usage of staff PPE, and extensive cleaning of exam room while observing appropriate contact time as indicated for disinfecting solutions.   Had cystoscopy with stent placement on 5/8  Stone still in then Had UTI then---Enterococcus  Then 6/1--had repeat cysto with removal of stone Stent replaced He and wife pulled it out 6/4 The next day---fever, vomiting, etc To ER Found to have UTI  Had IV fluids and antibiotics Culture grew Pseudomonas this time  Hospital records and discharge summary/cultures reviewed  Current Outpatient Medications on File Prior to Visit  Medication Sig Dispense Refill  . aspirin 81 MG EC tablet Take 81 mg by mouth daily.      . ciprofloxacin (CIPRO) 500 MG tablet Take 1 tablet (500 mg total) by mouth 2 (two) times daily for 12 days. 24 tablet 0  . diltiazem (CARDIZEM CD) 240 MG 24 hr capsule Take 1 capsule (240 mg total) by mouth daily. Patient needs to call the office and schedule an appointment for further refills 1st attempt 30 capsule 0  . Meth-Hyo-M Bl-Na Phos-Ph Sal (URIBEL) 118 MG CAPS Take 1 capsule (118 mg total) by mouth 3 (three) times daily as needed (Urinary frequency, urgency, burning). 15 capsule 0  . metoprolol tartrate (LOPRESSOR) 25 MG tablet Take 1 tablet (25 mg total) by mouth 2 (two) times daily. Please call and schedule an appointment for further refills 1st attempt 60 tablet 0  . rosuvastatin (CRESTOR) 20 MG tablet TAKE 1 TABLET BY MOUTH ONCE DAILY (Patient taking differently: Take 20 mg by mouth every morning. ) 90 tablet 3  . tamsulosin (FLOMAX) 0.4 MG CAPS capsule Take 1 capsule (0.4 mg total) by mouth daily. (Patient taking differently: Take 0.4 mg by  mouth daily after supper. ) 90 capsule 3   No current facility-administered medications on file prior to visit.    Allergies  Allergen Reactions  . Sulfamethoxazole-Trimethoprim Rash    Past Medical History:  Diagnosis Date  . AAA (abdominal aortic aneurysm) ([place])    [name], Repaired, 2005  . Atrial fibrillation (HCC)    Postoperative in past  . Carotid artery disease ([place])    Minimal, doppler 2004, 0-39% bilateral, doppler 05/17/09 0-39% bilateral  . COPD (chronic obstructive pulmonary disease) ([place])   . Coronary artery disease    Moderate, catheterization 2005, nuclear scan 9/09 no ischemia, EF 60%, echo  . ED (erectile dysfunction)   . Ejection fraction    EF 60% echo, September, 2009  . Family history of adverse reaction to anesthesia    DAD-WOKE UP DURING SURGERY  . GERD (gastroesophageal reflux disease)   . Hemorrhoids   . Hyperlipidemia   . Hypertension   . Lung nodule    Followup by pulmonary in past  . Palpitations    History palpitations in the past  . Palpitations    January, 2013  . Shortness of breath     Past Surgical History:  Procedure Laterality Date  . ABDOMINAL AORTIC ANEURYSM REPAIR  04/06/04   Dr. [name]  . BACK SURGERY    . CHOLECYSTECTOMY  11/26/02  . CYSTOSCOPY W/ RETROGRADES Left 01/05/2020   Procedure: CYSTOSCOPY WITH RETROGRADE PYELOGRAM;  Surgeon: Abbie Sons, MD;  Location: [facility];  Service: Urology;  Laterality: Left;  . CYSTOSCOPY WITH STENT PLACEMENT Left 12/12/2019   Procedure: CYSTOSCOPY WITH STENT PLACEMENT;  Surgeon: Lucas Mallow, MD;  Location: [facility];  Service: Urology;  Laterality: Left;  . CYSTOSCOPY/URETEROSCOPY/HOLMIUM LASER/STENT PLACEMENT Left 01/05/2020   Procedure: CYSTOSCOPY/URETEROSCOPY/HOLMIUM LASER/STENT Exchange;  Surgeon: Abbie Sons, MD;  Location: [facility];  Service: Urology;  Laterality: Left;  . PILONIDAL CYST EXCISION  11/11    Family History  Problem Relation Age of Onset  . Hypertension  Mother        ?  . Stroke Mother        CVA  . Diabetes Mother        DM  . Heart attack Father        MI  . Cancer Sister        Gall bladder   . Stroke Other        All mom's family died of strokes    Social History   Socioeconomic History  . Marital status: Married    Spouse name: Not on file  . Number of children: 3  . Years of education: Not on file  . Highest education level: Not on file  Occupational History  . Occupation: Truck Geophysicist/field seismologist    Comment: Retired  Tobacco Use  . Smoking status: Former Smoker    Quit date: 08/06/2002    Years since quitting: 17.4  . Smokeless tobacco: Current User    Types: Chew  Substance and Sexual Activity  . Alcohol use: No  . Drug use: No  . Sexual activity: Not on file  Other Topics Concern  . Not on file  Social History Narrative   No living will   Would want wife--then daughter Joelene Millin-- to make decisions for him.   Would accept resuscitation   No feeding tube if cognitively unaware   Social Determinants of Health   Financial Resource Strain:   . Difficulty of Paying Living Expenses:   Food Insecurity:   . Worried About Charity fundraiser in the Last Year:   . Arboriculturist in the Last Year:   Transportation Needs:   . Film/video editor (Medical):   Marland Kitchen Lack of Transportation (Non-Medical):   Physical Activity:   . Days of Exercise per Week:   . Minutes of Exercise per Session:   Stress:   . Feeling of Stress :   Social Connections:   . Frequency of Communication with Friends and Family:   . Frequency of Social Gatherings with Friends and Family:   . Attends Religious Services:   . Active Member of Clubs or Organizations:   . Attends Archivist Meetings:   Marland Kitchen Marital Status:   Intimate Partner Violence:   . Fear of Current or Ex-Partner:   . Emotionally Abused:   Marland Kitchen Physically Abused:   . Sexually Abused:    Review of Systems No fever now Voiding fine No N/V Eating well    Objective:    Physical Exam  Constitutional: No distress.  Cardiovascular: Normal rate and regular rhythm. Exam reveals no gallop.  No murmur heard. Respiratory: Effort normal and breath sounds normal. He has no wheezes. He has no rales.  GI: Normal appearance.  Musculoskeletal:     Cervical back: Normal range of motion.     Comments: No CVA tenderness  Lymphadenopathy:    He has no cervical adenopathy.  Neurological: He is alert.  Skin: No  rash noted.  Psychiatric: His behavior is normal. Mood normal.           Assessment & Plan:

## 2020-02-04 ENCOUNTER — Encounter: Payer: Self-pay | Admitting: Urology

## 2020-02-04 ENCOUNTER — Other Ambulatory Visit: Payer: Self-pay

## 2020-02-04 ENCOUNTER — Ambulatory Visit (INDEPENDENT_AMBULATORY_CARE_PROVIDER_SITE_OTHER): Payer: Medicare Other | Admitting: Urology

## 2020-02-04 VITALS — BP 124/77 | HR 67 | Ht 69.09 in | Wt 154.0 lb

## 2020-02-04 DIAGNOSIS — N200 Calculus of kidney: Secondary | ICD-10-CM

## 2020-02-04 DIAGNOSIS — Z9889 Other specified postprocedural states: Secondary | ICD-10-CM

## 2020-02-05 ENCOUNTER — Encounter: Payer: Self-pay | Admitting: Urology

## 2020-02-05 ENCOUNTER — Ambulatory Visit: Payer: Medicare Other | Admitting: Internal Medicine

## 2020-02-05 NOTE — Progress Notes (Signed)
02/04/2020 2:44 PM   Timothy Eaton 1940-09-29 [id number]  Referring provider: [name], MD [address] [place],  [place] [address]  Chief Complaint  Patient presents with  . Nephrolithiasis    Urologic history: 1.  Nephrolithiasis -Urgent stent placement 12/12/2019 due to 7 mm proximal ureteral calculus with infection -Bilateral nonobstructing renal calculi -Left ureteroscopy/laser lithotripsy/stone removal 01/05/2020 -Stone analysis 100% calcium oxalate monohydrate   HPI: 79 year old male presents for postop follow-up.   Uneventful ureteroscopic stone removal 01/05/2020  He removed his stent via the tether 01/08/2020  Seen in ED 6/5 complaining of significant nausea/vomiting  No flank or abdominal pain  CT abdomen/pelvis performed which showed mild left hydroureter; no calculi seen  Pain resolved after 24 hours  Urine culture grew Pseudomonas (preop urine culture negative)  He was hospitalized and received IV antibiotics  Presently asymptomatic   PMH: Past Medical History:  Diagnosis Date  . AAA (abdominal aortic aneurysm) ([place])    [name], Repaired, 2005  . Atrial fibrillation (HCC)    Postoperative in past  . Carotid artery disease ([place])    Minimal, doppler 2004, 0-39% bilateral, doppler 05/17/09 0-39% bilateral  . COPD (chronic obstructive pulmonary disease) ([place])   . Coronary artery disease    Moderate, catheterization 2005, nuclear scan 9/09 no ischemia, EF 60%, echo  . ED (erectile dysfunction)   . Ejection fraction    EF 60% echo, September, 2009  . Family history of adverse reaction to anesthesia    DAD-WOKE UP DURING SURGERY  . GERD (gastroesophageal reflux disease)   . Hemorrhoids   . Hyperlipidemia   . Hypertension   . Lung nodule    Followup by pulmonary in past  . Palpitations    History palpitations in the past  . Palpitations    January, 2013  . Shortness of breath     Surgical History: Past Surgical History:    Procedure Laterality Date  . ABDOMINAL AORTIC ANEURYSM REPAIR  04/06/04   Dr. [name]  . BACK SURGERY    . CHOLECYSTECTOMY  11/26/02  . CYSTOSCOPY W/ RETROGRADES Left 01/05/2020   Procedure: CYSTOSCOPY WITH RETROGRADE PYELOGRAM;  Surgeon: Abbie Sons, MD;  Location: [facility];  Service: Urology;  Laterality: Left;  . CYSTOSCOPY WITH STENT PLACEMENT Left 12/12/2019   Procedure: CYSTOSCOPY WITH STENT PLACEMENT;  Surgeon: Lucas Mallow, MD;  Location: [facility];  Service: Urology;  Laterality: Left;  . CYSTOSCOPY/URETEROSCOPY/HOLMIUM LASER/STENT PLACEMENT Left 01/05/2020   Procedure: CYSTOSCOPY/URETEROSCOPY/HOLMIUM LASER/STENT Exchange;  Surgeon: Abbie Sons, MD;  Location: [facility];  Service: Urology;  Laterality: Left;  . PILONIDAL CYST EXCISION  11/11    Home Medications:  Allergies as of 02/04/2020      Reactions   Sulfamethoxazole-trimethoprim Rash      Medication List       Accurate as of February 04, 2020 11:59 PM. If you have any questions, ask your nurse or doctor.        STOP taking these medications   Uribel 118 MG Caps Stopped by: Abbie Sons, MD     TAKE these medications   aspirin 81 MG EC tablet Take 81 mg by mouth daily.   diltiazem 240 MG 24 hr capsule Commonly known as: CARDIZEM CD Take 1 capsule (240 mg total) by mouth daily. Patient needs to call the office and schedule an appointment for further refills 1st attempt   metoprolol tartrate 25 MG tablet Commonly known as: LOPRESSOR Take 1 tablet (  25 mg total) by mouth 2 (two) times daily. Please call and schedule an appointment for further refills 1st attempt   rosuvastatin 20 MG tablet Commonly known as: CRESTOR TAKE 1 TABLET BY MOUTH ONCE DAILY What changed: when to take this   tamsulosin 0.4 MG Caps capsule Commonly known as: FLOMAX Take 1 capsule (0.4 mg total) by mouth daily. What changed: when to take this       Allergies:  Allergies  Allergen Reactions  .  Sulfamethoxazole-Trimethoprim Rash    Family History: Family History  Problem Relation Age of Onset  . Hypertension Mother        ?  . Stroke Mother        CVA  . Diabetes Mother        DM  . Heart attack Father        MI  . Cancer Sister        Gall bladder   . Stroke Other        All mom's family died of strokes    Social History:  reports that he quit smoking about 17 years ago. His smokeless tobacco use includes chew. He reports that he does not drink alcohol and does not use drugs.   Physical Exam: BP 124/77   Pulse 67   Ht 5' 9.09" (1.755 m)   Wt 154 lb (69.9 kg)   BMI 22.68 kg/m   Constitutional:  Alert and oriented, No acute distress. HEENT: [place] AT, moist mucus membranes.  Trachea midline, no masses. Cardiovascular: No clubbing, cyanosis, or edema. Respiratory: Normal respiratory effort, no increased work of breathing. Lymph: No cervical or inguinal lymphadenopathy. Skin: No rashes, bruises or suspicious lesions. Neurologic: Grossly intact, no focal deficits, moving all 4 extremities. Psychiatric: Normal mood and affect.   Assessment & Plan:    1. Nephrolithiasis  Status post ureteroscopic removal of multiple left renal calculi.  Mid 1 day postop for infection.  Currently doing well.  Follow-up RUS 2 months; will call with results  Follow-up 6 months with [place], MD  [facility] [address], [place], [place] [address] [phone]

## 2020-02-16 ENCOUNTER — Other Ambulatory Visit: Payer: Self-pay

## 2020-02-16 MED ORDER — DILTIAZEM HCL ER COATED BEADS 240 MG PO CP24: 240.0000 mg | ORAL_CAPSULE | Freq: Every day | ORAL | 0 refills | Status: DC

## 2020-02-16 MED ORDER — METOPROLOL TARTRATE 25 MG PO TABS: 25.0000 mg | ORAL_TABLET | Freq: Two times a day (BID) | ORAL | 0 refills | Status: DC

## 2020-02-16 NOTE — Addendum Note (Signed)
Addended by: Carter Kitten D on: 02/16/2020 02:15 PM   Modules accepted: Orders

## 2020-03-08 ENCOUNTER — Encounter: Payer: Self-pay | Admitting: Cardiovascular Disease

## 2020-03-08 ENCOUNTER — Ambulatory Visit (INDEPENDENT_AMBULATORY_CARE_PROVIDER_SITE_OTHER): Payer: Medicare Other | Admitting: Cardiovascular Disease

## 2020-03-08 ENCOUNTER — Other Ambulatory Visit: Payer: Self-pay

## 2020-03-08 VITALS — BP 124/62 | HR 60 | Ht 69.0 in | Wt 155.0 lb

## 2020-03-08 DIAGNOSIS — I6523 Occlusion and stenosis of bilateral carotid arteries: Secondary | ICD-10-CM

## 2020-03-08 DIAGNOSIS — I10 Essential (primary) hypertension: Secondary | ICD-10-CM | POA: Diagnosis not present

## 2020-03-08 DIAGNOSIS — E782 Mixed hyperlipidemia: Secondary | ICD-10-CM

## 2020-03-08 NOTE — Progress Notes (Signed)
Cardiology Office Note   Date:  03/08/2020   ID:  Timothy Eaton, Timothy Eaton [birth year], MRN [id number]  PCP:  Timothy Carbon, MD  Cardiologist: former Timothy Eaton , now  Timothy Moores, MD   Chief Complaint  Patient presents with  . Coronary Artery Disease   Problem list 1. Coronary artery disease - mild CAD -  CAD ~ 2005  2. Carotid artery stenosis - mild 3. Atrial fibrillation-postoperative 4. Essential hypertension 5. Hyperlipidemia 6. Abdominal aortic aneurysm repair   Timothy Eaton is a 79 y.o. male who presents today to follow-up coronary artery disease. He is feeling well. I saw him last May, 2015. His carotids are followed carefully. He had a nuclear scan in 2014 showing no definite ischemia. There was question of some mild ischemia, but it may been diaphragmatic attenuation. He has not had any significant symptoms.  Dec 26, 2015:  Complains of some right neck pain - especially in the afternoons.  Has not been active.    Retired from truck driving   Dec 14, 2977:  Timothy Eaton is seen today for follow up of his CAD  Has carotid disease - followed at [facility].  No CP or dyspnea  Some dyspnea.  January 20, 2018:  Timothy Eaton is seen today for follow-up of his coronary artery disease and carotid disease.  He is followed at [facility] for his carotid disease. Doing well.  Not as much exercise.   Used to walk regularly . No CP , no dyspnea   February 17, 2019 : Doing well.  Very active , likes to fish.    No CP or dyspnea.  Some DOE if he does something too strenuous .    Aug. 3, 2021:   Hx of carotid disease and AAA repair. No cp, Some dyspnea with exercise .  Walks on occasion without any issues.  Mows the yard without any issues.    Past Medical History:  Diagnosis Date  . AAA (abdominal aortic aneurysm) ([place])    Timothy Eaton, Repaired, 2005  . Atrial fibrillation (HCC)    Postoperative in past  . Carotid artery disease ([place])    Minimal, doppler 2004, 0-39% bilateral, doppler 05/17/09 0-39%  bilateral  . COPD (chronic obstructive pulmonary disease) ([place])   . Coronary artery disease    Moderate, catheterization 2005, nuclear scan 9/09 no ischemia, EF 60%, echo  . ED (erectile dysfunction)   . Ejection fraction    EF 60% echo, September, 2009  . Family history of adverse reaction to anesthesia    DAD-WOKE UP DURING SURGERY  . GERD (gastroesophageal reflux disease)   . Hemorrhoids   . Hyperlipidemia   . Hypertension   . Lung nodule    Followup by pulmonary in past  . Palpitations    History palpitations in the past  . Palpitations    January, 2013  . Shortness of breath     Past Surgical History:  Procedure Laterality Date  . ABDOMINAL AORTIC ANEURYSM REPAIR  04/06/04   Dr. Scot Eaton  . BACK SURGERY    . CHOLECYSTECTOMY  11/26/02  . CYSTOSCOPY W/ RETROGRADES Left 01/05/2020   Procedure: CYSTOSCOPY WITH RETROGRADE PYELOGRAM;  Surgeon: Abbie Sons, MD;  Location: [facility];  Service: Urology;  Laterality: Left;  . CYSTOSCOPY WITH STENT PLACEMENT Left 12/12/2019   Procedure: CYSTOSCOPY WITH STENT PLACEMENT;  Surgeon: Lucas Mallow, MD;  Location: [facility];  Service: Urology;  Laterality: Left;  . CYSTOSCOPY/URETEROSCOPY/HOLMIUM LASER/STENT PLACEMENT Left 01/05/2020  Procedure: CYSTOSCOPY/URETEROSCOPY/HOLMIUM LASER/STENT Exchange;  Surgeon: Abbie Sons, MD;  Location: [facility];  Service: Urology;  Laterality: Left;  . PILONIDAL CYST EXCISION  11/11    Patient Active Problem List   Diagnosis Date Noted  . CKD (chronic kidney disease), stage IIIa 01/09/2020  . Complicated UTI (urinary tract infection) 01/09/2020  . Nephrolithiasis 12/19/2019  . Pyelonephritis 12/12/2019  . Acute pyelonephritis 12/11/2019  . Acute renal failure superimposed on stage 3a chronic kidney disease ([place]) 12/11/2019  . Memory loss 12/09/2019  . BPH with obstruction/lower urinary tract symptoms 07/07/2019  . UTI (urinary tract infection) 06/30/2019  . Elevated troponin 06/30/2019  .  Preventative health care 04/09/2017  . Advance directive discussed with patient 04/09/2017  . COPD exacerbation ([place]) 07/29/2015  . Coronary artery disease   . Carotid artery disease ([place])   . Atrial fibrillation ([place])   . Pulmonary nodules 04/02/2011  . COPD (chronic obstructive pulmonary disease) with emphysema ([place]) 04/02/2009  . HEMORRHOIDS, HX OF 04/02/2009  . Hyperlipidemia 06/25/2008  . Essential hypertension 06/25/2008      Current Outpatient Medications  Medication Sig Dispense Refill  . aspirin 81 MG EC tablet Take 81 mg by mouth daily.      Marland Kitchen diltiazem (CARDIZEM CD) 240 MG 24 hr capsule Take 1 capsule (240 mg total) by mouth daily. Please keep upcoming appt with Dr. Acie Fredrickson in August before anymore refills. Thank you 90 capsule 0  . metoprolol tartrate (LOPRESSOR) 25 MG tablet Take 1 tablet (25 mg total) by mouth 2 (two) times daily. Please keep upcoming appt in August with Dr. Acie Fredrickson before anymore refills. Thank you 180 tablet 0  . rosuvastatin (CRESTOR) 20 MG tablet TAKE 1 TABLET BY MOUTH ONCE DAILY 90 tablet 3  . tamsulosin (FLOMAX) 0.4 MG CAPS capsule Take 0.4 mg by mouth as needed.     No current facility-administered medications for this visit.    Allergies:   Sulfamethoxazole-trimethoprim    Social History:  The patient  reports that he quit smoking about 17 years ago. His smokeless tobacco use includes chew. He reports that he does not drink alcohol and does not use drugs.   Family History:  The patient's family history includes Cancer in his sister; Diabetes in his mother; Heart attack in his father; Hypertension in his mother; Stroke in his mother and another family member.    ROS:  Please see the history of present illness.    Patient denies fever, chills, headache, sweats, rash, change in vision, change in hearing, chest pain, cough, nausea or vomiting, urinary symptoms. All other systems are reviewed and are negative.    Physical Exam: Blood pressure  124/62, pulse 60, height 5\' 9"  (1.753 m), weight 155 lb (70.3 kg), SpO2 96 %.  GEN:  Well nourished, well developed in no acute distress HEENT: Normal NECK: No JVD; No carotid bruits LYMPHATICS: No lymphadenopathy CARDIAC: RRR , no murmurs, rubs, gallops RESPIRATORY:  Clear to auscultation without rales, wheezing or rhonchi  ABDOMEN: Soft, non-tender, non-distended MUSCULOSKELETAL:  No edema; No deformity  SKIN: Warm and dry NEUROLOGIC:  Alert and oriented x 3   Recent Labs: 12/13/2019: Magnesium 2.1 01/09/2020: ALT 21 01/12/2020: BUN 20; Creatinine, Ser 1.27; Hemoglobin 13.3; Platelets 119; Potassium 3.7; Sodium 135    Lipid Panel    Component Value Date/Time   CHOL 103 02/17/2019 1035   TRIG 52 02/17/2019 1035   HDL 45 02/17/2019 1035   CHOLHDL 2.3 02/17/2019 1035   CHOLHDL 3 04/09/2017 1008  VLDL 14.8 04/09/2017 1008   LDLCALC 48 02/17/2019 1035      Wt Readings from Last 3 Encounters:  03/08/20 155 lb (70.3 kg)  02/04/20 154 lb (69.9 kg)  01/19/20 158 lb (71.7 kg)      Current medicines are reviewed  The patient understands his medications.    ASSESSMENT AND PLAN:  1. Coronary artery disease -     No angina , cont current meds    2. Carotid artery stenosis -     Stable, followed by [facility]   4. Essential hypertension-    BP is well controlled.   Cont meds. . Cont diet and exercise   5. Hyperlipidemia -   Last lipid level was a year ago.   Will draw today  Cont crestor   6. Abdominal aortic aneurysm repair -  Followed by [facility]     Timothy Moores, MD  03/08/2020 3:50 PM    [address],  [place], [place]  [address] Pager [phone] Phone: [phone]; Fax: [phone]

## 2020-03-08 NOTE — Patient Instructions (Signed)
Medication Instructions:  Your physician recommends that you continue on your current medications as directed. Please refer to the Current Medication list given to you today.  *If you need a refill on your cardiac medications before your next appointment, please call your pharmacy*   Lab Work: TODAY: lipids  If you have labs (blood work) drawn today and your tests are completely normal, you will receive your results only by: Marland Kitchen MyChart Message (if you have MyChart) OR . A paper copy in the mail If you have any lab test that is abnormal or we need to change your treatment, we will call you to review the results.  Follow-Up: At [facility], you and your health needs are our priority.  As part of our continuing mission to provide you with exceptional heart care, we have created designated Provider Care Teams.  These Care Teams include your primary Cardiologist (physician) and Advanced Practice Providers (APPs -  Physician Assistants and Nurse Practitioners) who all work together to provide you with the care you need, when you need it.  We recommend signing up for the patient portal called "MyChart".  Sign up information is provided on this After Visit Summary.  MyChart is used to connect with patients for Virtual Visits (Telemedicine).  Patients are able to view lab/test results, encounter notes, upcoming appointments, etc.  Non-urgent messages can be sent to your provider as well.   To learn more about what you can do with MyChart, go to [email or web address].    Your next appointment:   1 year(s)  The format for your next appointment:   In Person  Provider:   You may see [name], MD or one of the following Advanced Practice Providers on your designated Care Team:    Richardson Dopp, PA-C  [place], [place]

## 2020-03-09 LAB — LIPID PANEL
Chol/HDL Ratio: 2.3 ratio (ref 0.0–5.0)
Cholesterol, Total: 93 mg/dL — ABNORMAL LOW (ref 100–199)
HDL: 40 mg/dL (ref >39)
LDL Chol Calc (NIH): 40 mg/dL (ref 0–99)
Triglycerides: 55 mg/dL (ref 0–149)
VLDL Cholesterol Cal: 13 mg/dL (ref 5–40)

## 2020-04-19 ENCOUNTER — Other Ambulatory Visit: Payer: Self-pay

## 2020-04-19 ENCOUNTER — Ambulatory Visit
Admission: RE | Admit: 2020-04-19 | Discharge: 2020-04-19 | Disposition: A | Discharge status: Home / Self Care | Payer: Medicare Other | Source: Ambulatory Visit | Attending: Urology | Admitting: Urology

## 2020-04-19 DIAGNOSIS — N281 Cyst of kidney, acquired: Secondary | ICD-10-CM | POA: Diagnosis not present

## 2020-04-19 DIAGNOSIS — N200 Calculus of kidney: Secondary | ICD-10-CM | POA: Insufficient documentation

## 2020-04-19 DIAGNOSIS — N1330 Unspecified hydronephrosis: Secondary | ICD-10-CM | POA: Diagnosis not present

## 2020-04-25 ENCOUNTER — Telehealth: Payer: Self-pay | Admitting: *Deleted

## 2020-04-25 ENCOUNTER — Other Ambulatory Visit: Payer: Self-pay | Admitting: *Deleted

## 2020-04-25 MED ORDER — ROSUVASTATIN CALCIUM 20 MG PO TABS: 20.0000 mg | ORAL_TABLET | Freq: Every day | ORAL | 3 refills | Status: DC

## 2020-04-25 NOTE — Telephone Encounter (Signed)
-----   Message from Abbie Sons, MD sent at 04/23/2020 12:59 PM EDT ----- Renal ultrasound showed no kidney blockage or stones.  Follow-up as scheduled

## 2020-04-25 NOTE — Telephone Encounter (Signed)
Notified patient as instructed, patient pleased. Discussed follow-up appointments, patient agrees  

## 2020-05-13 DIAGNOSIS — Z23 Encounter for immunization: Secondary | ICD-10-CM | POA: Diagnosis not present

## 2020-06-05 ENCOUNTER — Other Ambulatory Visit: Payer: Self-pay | Admitting: Internal Medicine

## 2020-06-06 ENCOUNTER — Other Ambulatory Visit: Payer: Self-pay | Admitting: Internal Medicine

## 2020-06-07 ENCOUNTER — Other Ambulatory Visit: Payer: Self-pay

## 2020-06-07 MED ORDER — METOPROLOL TARTRATE 25 MG PO TABS: 25.0000 mg | ORAL_TABLET | Freq: Two times a day (BID) | ORAL | 2 refills | Status: DC

## 2020-06-07 MED ORDER — DILTIAZEM HCL ER COATED BEADS 240 MG PO CP24: 240.0000 mg | ORAL_CAPSULE | Freq: Every day | ORAL | 2 refills | Status: DC

## 2020-08-08 ENCOUNTER — Ambulatory Visit: Payer: Self-pay | Admitting: Urology

## 2020-08-15 ENCOUNTER — Ambulatory Visit: Payer: Self-pay | Admitting: Urology

## 2020-09-02 ENCOUNTER — Other Ambulatory Visit: Payer: Self-pay | Admitting: Internal Medicine

## 2020-09-23 IMAGING — CT CT ABD-PELV W/ CM
2 of 5 series · 15 of 46 positions shown, 17 images · IV contrast (APPLIED)
Comparison: 12/11/2019, 04/19/2011

CLINICAL DATA: Post cystoscopy and ureteral stent placement on
01/05/2020 for nephrolithiasis, stent removal 01/08/2020, now with
vomiting, increased confusion, history of abdominal aortic aneurysm
post repair, atrial fibrillation, COPD, coronary artery disease,
hypertension, GERD

EXAM:
CT ABDOMEN AND PELVIS WITH CONTRAST
TECHNIQUE: Multidetector CT imaging of the abdomen and pelvis was performed
using the standard protocol following bolus administration of
intravenous contrast. Sagittal and coronal MPR images reconstructed
from axial data set.
CONTRAST:  100mL OMNIPAQUE IOHEXOL 300 MG/ML SOLN IV. No oral
contrast administered.

[Series 2: routine abd/pel with · axial · 0.85mm/px · z∈[-464,-74]mm · 12 of 92 slices shown, 14 images]
[im 7/92  soft-tissue]
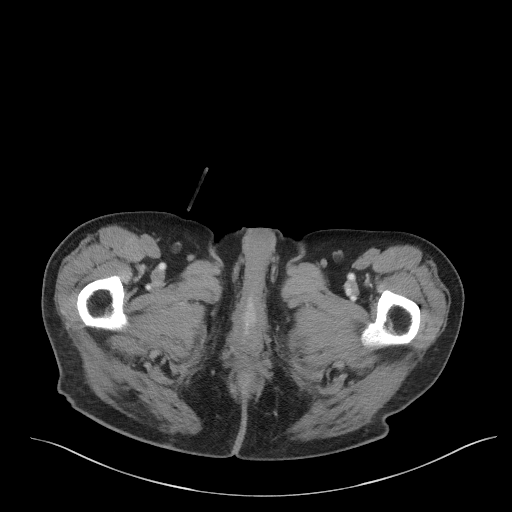
[im 7/92  bone]
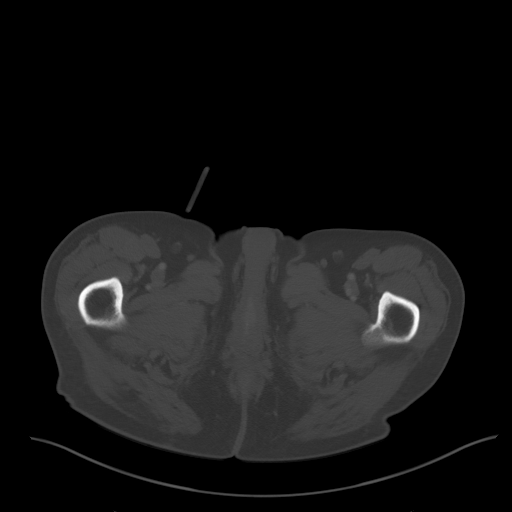
[im 14/92  soft-tissue]
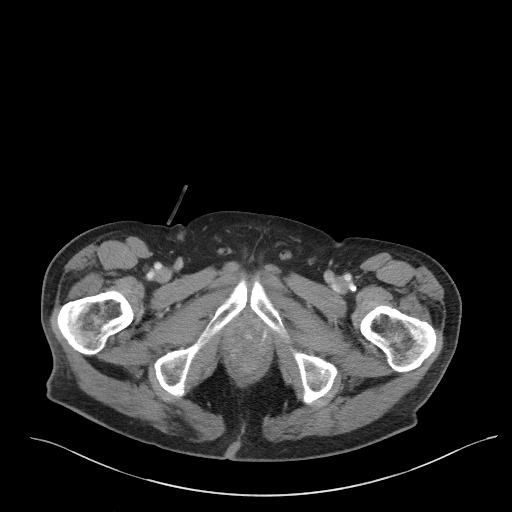
[im 20/92  soft-tissue]
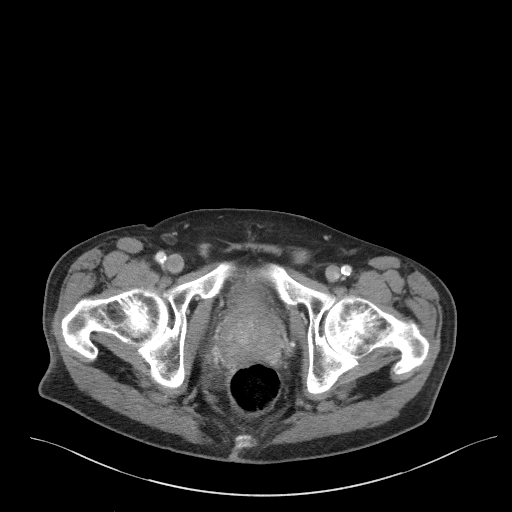
[im 27/92  soft-tissue]
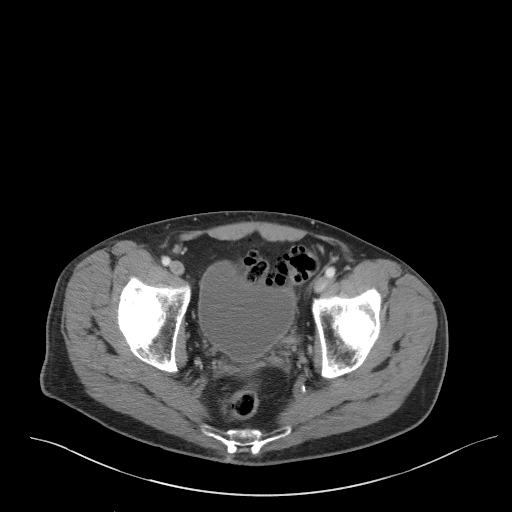
[im 33/92  soft-tissue]
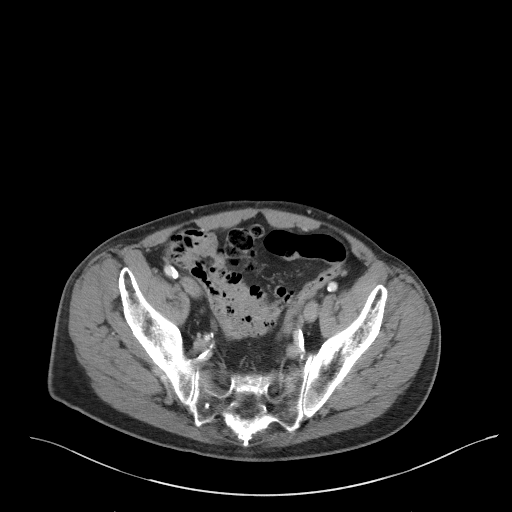
[im 40/92  soft-tissue]
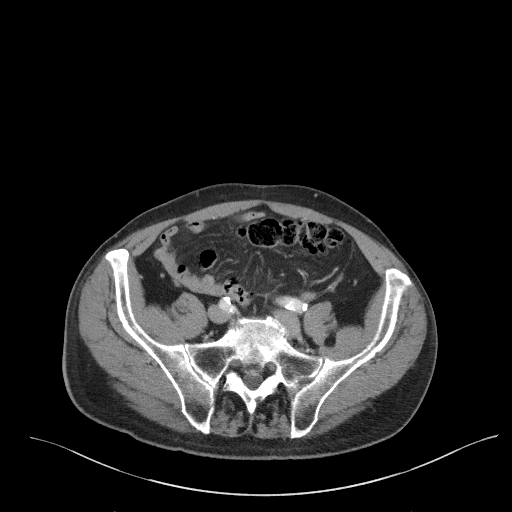
[im 53/92  soft-tissue]
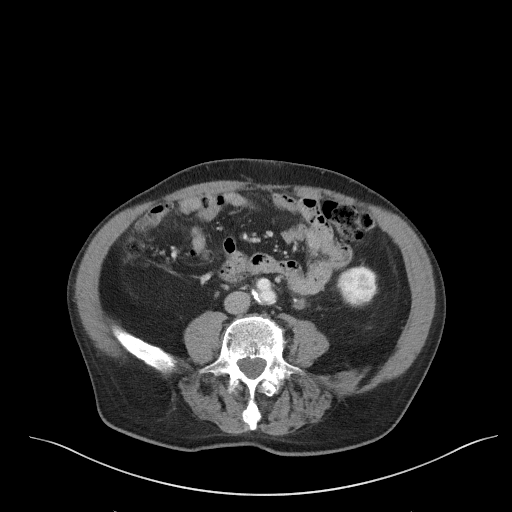
[im 59/92  soft-tissue]
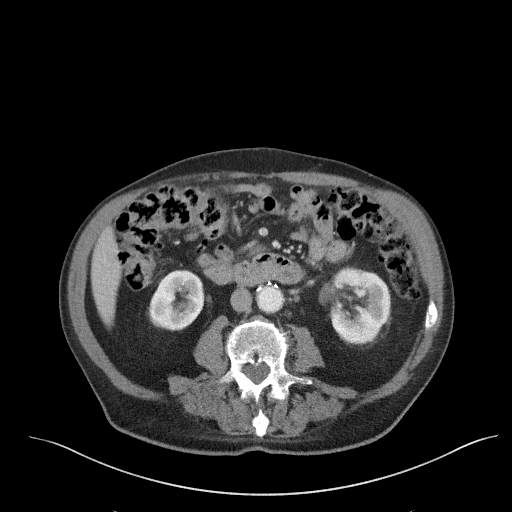
[im 66/92  soft-tissue]
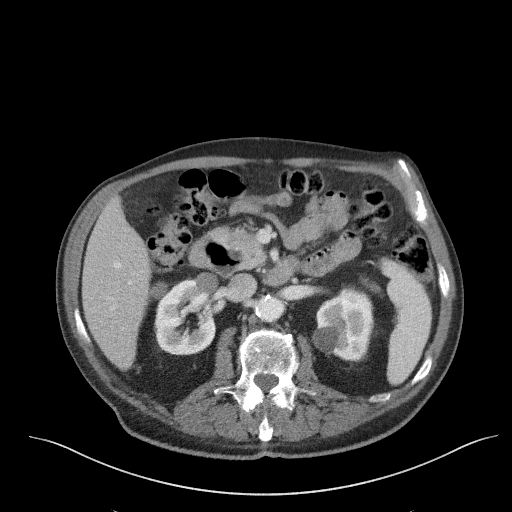
[im 66/92  bone]
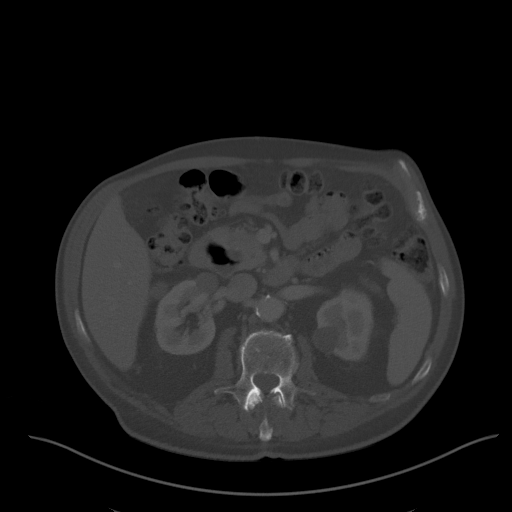
[im 72/92  soft-tissue]
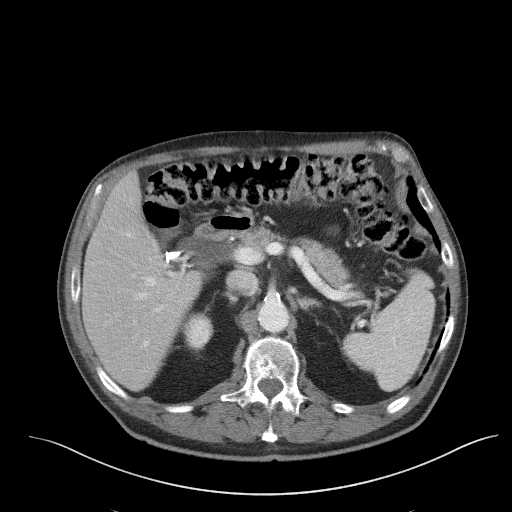
[im 79/92  soft-tissue]
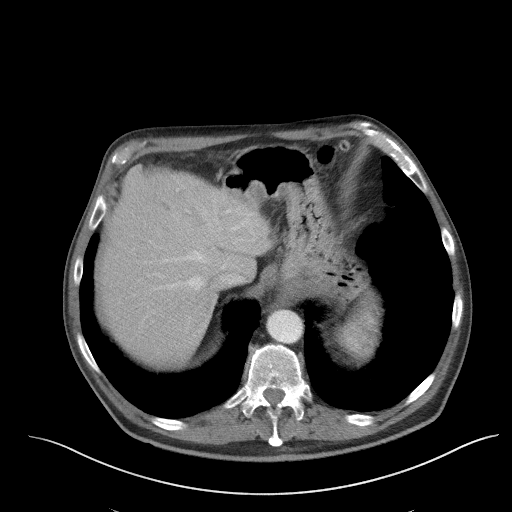
[im 85/92  soft-tissue]
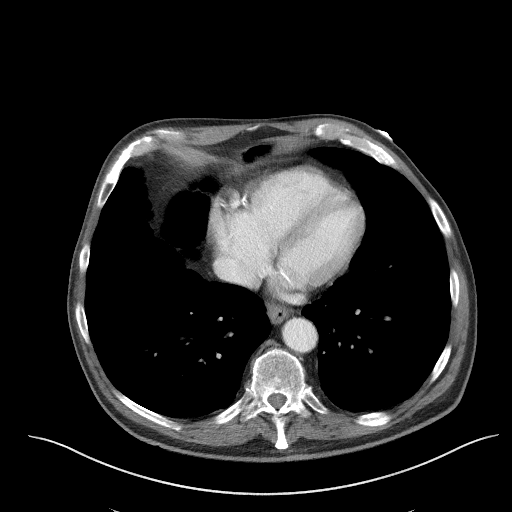

[Series 5: coronal st · coronal · 0.82mm/px · 3 of 100 slices shown]
[im 34/100  soft-tissue]
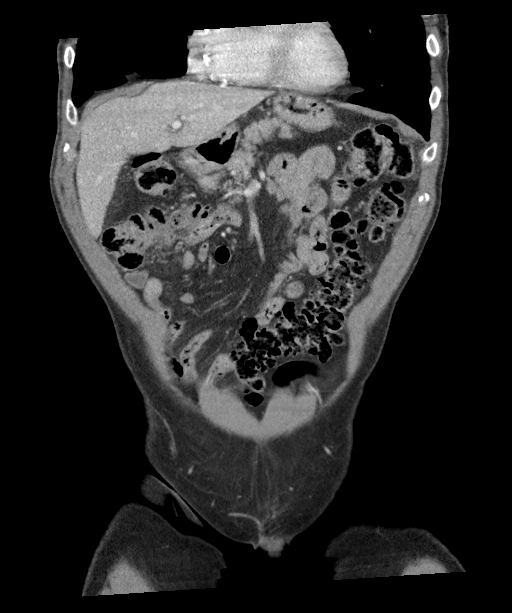
[im 45/100  soft-tissue]
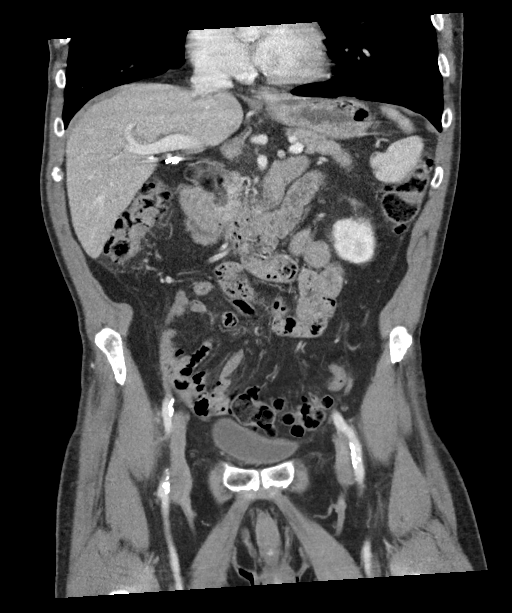
[im 56/100  soft-tissue]
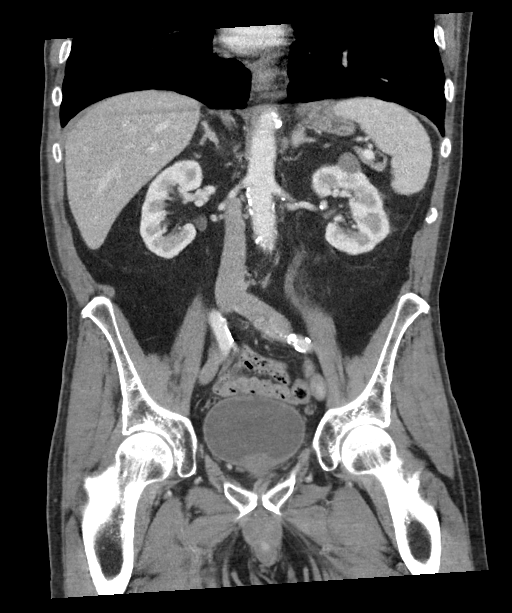

[15 of 46 positions shown; findings below may reference images not displayed]

FINDINGS: Lower chest: Lung bases emphysematous with LEFT lower lobe nodule
again identified 8 mm diameter image 13 unchanged since 04/19/2011.
Additional questionable 9 mm non nodular density at RIGHT diaphragm
image 13 unchanged since 04/19/2011.

Hepatobiliary: Gallbladder surgically absent. Few tiny cysts within
the LEFT lobe liver. Liver otherwise unremarkable.

Pancreas: Normal appearance

Spleen: Normal appearance

Adrenals/Urinary Tract: Adrenal glands normal appearance. BILATERAL
renal cysts. Additional mildly hyperdense lesions within the kidneys
including 19 x 17 mm nodule anterior upper pole LEFT kidney image 25
and 18 x 19 mm anterior upper to mid RIGHT renal lesion image 27
unchanged. Tiny nonobstructing RIGHT renal calculus. Diffuse wall
thickening and mild dilatation of LEFT ureter throughout its course
to the bladder with minimal associated periureteral infiltration,
cannot exclude passed calculus or urinary tract infection. No
ureteral calcifications seen. RIGHT ureter nondilated. Tiny amount
of air within urinary bladder question
instrumentation/catheterization.

Stomach/Bowel: Duodenal diverticulum noted. Small hiatal hernia.
Scattered colonic diverticulosis greatest at sigmoid colon. Normal
appendix. Stomach and bowel loops otherwise unremarkable.

Vascular/Lymphatic: Atherosclerotic calcifications aorta, iliac
arteries, visceral artery origins, and coronary arteries. Prior
distal abdominal aortic aneurysm repair. No adenopathy.

Reproductive: Prostatic enlargement with gland 5.6 x 4.8 x 5.1 cm
(volume = 72 cm^3)

Other: No free air or free fluid. No hernia or inflammatory process.

Musculoskeletal: Osseous demineralization. Scattered degenerative
disc disease changes lumbar spine and Schmorl's nodes.
IMPRESSION: Diffuse wall thickening and mild dilatation of LEFT ureter
throughout its course to the bladder with minimal associated
periureteral infiltration, cannot exclude passed calculus or urinary
tract infection, recommend correlation with urinalysis.

Tiny nonobstructing RIGHT renal calculus.

BILATERAL renal cysts with additional mildly hyperdense lesions
within the kidneys, question hyperdense cysts, stable.

Prostatic enlargement.

Colonic diverticulosis without evidence of diverticulitis.

Small hiatal hernia.

Stable LEFT lower lobe nodule and questionable non nodular density
at RIGHT diaphragm.

Prior abdominal aortic aneurysm repair.

Aortic Atherosclerosis (Z3DO6-1EO.O) and Emphysema (Z3DO6-GVK.C).

## 2020-12-01 ENCOUNTER — Other Ambulatory Visit: Payer: Self-pay | Admitting: Internal Medicine

## 2021-02-28 ENCOUNTER — Other Ambulatory Visit: Payer: Self-pay | Admitting: Internal Medicine

## 2021-02-28 ENCOUNTER — Other Ambulatory Visit: Payer: Self-pay | Admitting: Cardiovascular Disease

## 2021-03-06 ENCOUNTER — Encounter: Payer: Self-pay | Admitting: Cardiovascular Disease

## 2021-03-06 ENCOUNTER — Other Ambulatory Visit: Payer: Self-pay

## 2021-03-06 ENCOUNTER — Ambulatory Visit (INDEPENDENT_AMBULATORY_CARE_PROVIDER_SITE_OTHER): Payer: Medicare Other | Admitting: Cardiovascular Disease

## 2021-03-06 VITALS — BP 124/64 | HR 58 | Ht 66.0 in | Wt 151.0 lb

## 2021-03-06 DIAGNOSIS — I10 Essential (primary) hypertension: Secondary | ICD-10-CM | POA: Diagnosis not present

## 2021-03-06 DIAGNOSIS — I6523 Occlusion and stenosis of bilateral carotid arteries: Secondary | ICD-10-CM

## 2021-03-06 DIAGNOSIS — E782 Mixed hyperlipidemia: Secondary | ICD-10-CM | POA: Diagnosis not present

## 2021-03-06 NOTE — Patient Instructions (Signed)
Medication Instructions:  Your physician recommends that you continue on your current medications as directed. Please refer to the Current Medication list given to you today.  *If you need a refill on your cardiac medications before your next appointment, please call your pharmacy*   Lab Work: TODAY: FLP, ALT, BMET If you have labs (blood work) drawn today and your tests are completely normal, you will receive your results only by: [place] (if you have MyChart) OR A paper copy in the mail If you have any lab test that is abnormal or we need to change your treatment, we will call you to review the results.   Testing/Procedures: Your physician has requested that you have a carotid duplex. This test is an ultrasound of the carotid arteries in your neck. It looks at blood flow through these arteries that supply the brain with blood. Allow one hour for this exam. There are no restrictions or special instructions.   Follow-Up: At [facility], you and your health needs are our priority.  As part of our continuing mission to provide you with exceptional heart care, we have created designated Provider Care Teams.  These Care Teams include your primary Cardiologist (physician) and Advanced Practice Providers (APPs -  Physician Assistants and Nurse Practitioners) who all work together to provide you with the care you need, when you need it.  Your next appointment:   1 year(s)  The format for your next appointment:   In Person  Provider:   You will see one of the following Advanced Practice Providers on your designated Care Team:   Richardson Dopp, PA-C [place], [place]

## 2021-03-06 NOTE — Progress Notes (Signed)
Cardiology Office Note   Date:  03/06/2021   ID:  Remer, Stango Feb 21, 1941, MRN [id number]  PCP:  [name], MD  Cardiologist: former [name] , now  [name], MD   Chief Complaint  Patient presents with   Coronary Artery Disease   Hyperlipidemia   Problem list 1. Coronary artery disease - mild CAD -  CAD ~ 2005  2. Carotid artery stenosis - mild 3. Atrial fibrillation-postoperative 4. Essential hypertension 5. Hyperlipidemia 6. Abdominal aortic aneurysm repair   Timothy Eaton is a 80 y.o. male who presents today to follow-up coronary artery disease. He is feeling well. I saw him last May, 2015. His carotids are followed carefully. He had a nuclear scan in 2014 showing no definite ischemia. There was question of some mild ischemia, but it may been diaphragmatic attenuation. He has not had any significant symptoms.  Dec 26, 2015:  Complains of some right neck pain - especially in the afternoons.  Has not been active.    Retired from truck driving   May 11, [id number]:  Timothy Eaton is seen today for follow up of his CAD  Has carotid disease - followed at [facility].  No CP or dyspnea  Some dyspnea.  January 20, 2018:  Timothy Eaton is seen today for follow-up of his coronary artery disease and carotid disease.  He is followed at [facility] for his carotid disease. Doing well.  Not as much exercise.   Used to walk regularly . No CP , no dyspnea   February 17, 2019 : Doing well.  Very active , likes to fish.    No CP or dyspnea.  Some DOE if he does something too strenuous .    Aug. 3, 2021:   Hx of carotid disease and AAA repair. No cp, Some dyspnea with exercise .  Walks on occasion without any issues.  Mows the yard without any issues.   Aug. 1, 2022: No CP or dyspnea Occasionally feels like a bug is crawling on the back of the right side of his back neck    Past Medical History:  Diagnosis Date   AAA (abdominal aortic aneurysm) ([place])    [name], Repaired, 2005   Atrial  fibrillation ([place])    Postoperative in past   Carotid artery disease ([place])    Minimal, doppler 2004, 0-39% bilateral, doppler 05/17/09 0-39% bilateral   COPD (chronic obstructive pulmonary disease) ([place])    Coronary artery disease    Moderate, catheterization 2005, nuclear scan 9/09 no ischemia, EF 60%, echo   ED (erectile dysfunction)    Ejection fraction    EF 60% echo, September, 2009   Family history of adverse reaction to anesthesia    DAD-WOKE UP DURING SURGERY   GERD (gastroesophageal reflux disease)    Hemorrhoids    Hyperlipidemia    Hypertension    Lung nodule    Followup by pulmonary in past   Palpitations    History palpitations in the past   Palpitations    January, 2013   Shortness of breath     Past Surgical History:  Procedure Laterality Date   ABDOMINAL AORTIC ANEURYSM REPAIR  04/06/04   Dr. Rodney Langton SURGERY     CHOLECYSTECTOMY  11/26/02   CYSTOSCOPY W/ RETROGRADES Left 01/05/2020   Procedure: CYSTOSCOPY WITH RETROGRADE PYELOGRAM;  Surgeon: Abbie Sons, MD;  Location: [facility];  Service: Urology;  Laterality: Left;   CYSTOSCOPY WITH STENT PLACEMENT Left 12/12/2019   Procedure:  CYSTOSCOPY WITH STENT PLACEMENT;  Surgeon: Lucas Mallow, MD;  Location: [facility];  Service: Urology;  Laterality: Left;   CYSTOSCOPY/URETEROSCOPY/HOLMIUM LASER/STENT PLACEMENT Left 01/05/2020   Procedure: CYSTOSCOPY/URETEROSCOPY/HOLMIUM LASER/STENT Exchange;  Surgeon: Abbie Sons, MD;  Location: [facility];  Service: Urology;  Laterality: Left;   PILONIDAL CYST EXCISION  11/11    Patient Active Problem List   Diagnosis Date Noted   CKD (chronic kidney disease), stage IIIa [id number]   Complicated UTI (urinary tract infection) 01/09/2020   Nephrolithiasis 12/19/2019   Pyelonephritis 12/12/2019   Acute pyelonephritis 12/11/2019   Acute renal failure superimposed on stage 3a chronic kidney disease ([place]) 12/11/2019   Memory loss 12/09/2019   BPH with obstruction/lower  urinary tract symptoms 07/07/2019   UTI (urinary tract infection) 06/30/2019   Elevated troponin 06/30/2019   Preventative health care 04/09/2017   Advance directive discussed with patient 04/09/2017   COPD exacerbation ([place]) 07/29/2015   Coronary artery disease    Carotid artery disease (H[facility])    Atrial fibrillation ([place])    Pulmonary nodules 04/02/2011   COPD (chronic obstructive pulmonary disease) with emphysema ([place]) 04/02/2009   HEMORRHOIDS, HX OF 04/02/2009   Hyperlipidemia 06/25/2008   Essential hypertension 06/25/2008      Current Outpatient Medications  Medication Sig Dispense Refill   aspirin 81 MG EC tablet Take 81 mg by mouth daily.       diltiazem (CARDIZEM CD) 240 MG 24 hr capsule Take 1 capsule (240 mg total) by mouth daily. 90 capsule 2   metoprolol tartrate (LOPRESSOR) 25 MG tablet Take 1 tablet (25 mg total) by mouth 2 (two) times daily. 180 tablet 2   rosuvastatin (CRESTOR) 20 MG tablet TAKE 1 TABLET(20 MG) BY MOUTH DAILY 90 tablet 3   tamsulosin (FLOMAX) 0.4 MG CAPS capsule Take 1 capsule (0.4 mg total) by mouth daily. NEEDS OFFICE VISIT 90 capsule 0   No current facility-administered medications for this visit.    Allergies:   Sulfamethoxazole-trimethoprim    Social History:  The patient  reports that he has quit smoking. His smokeless tobacco use includes chew. He reports that he does not drink alcohol and does not use drugs.   Family History:  The patient's family history includes Cancer in his sister; Diabetes in his mother; Heart attack in his father; Hypertension in his mother; Stroke in his mother and another family member.    ROS:  Please see the history of present illness.    Patient denies fever, chills, headache, sweats, rash, change in vision, change in hearing, chest pain, cough, nausea or vomiting, urinary symptoms. All other systems are reviewed and are negative.    Physical Exam: Blood pressure 124/64, pulse (!) 58, height '5\' 6"'$  (1.676 m),  weight 151 lb (68.5 kg).  GEN:  Well nourished, well developed in no acute distress HEENT: Normal NECK: No JVD; No carotid bruits LYMPHATICS: No lymphadenopathy CARDIAC: RRR , no murmurs, rubs, gallops RESPIRATORY:  Clear to auscultation without rales, wheezing or rhonchi  ABDOMEN: Soft, non-tender, non-distended MUSCULOSKELETAL:  No edema; No deformity  SKIN: Warm and dry NEUROLOGIC:  Alert and oriented x 3  EKG: March 06, 2021: Sinus bradycardia 58.  No ST or T wave changes. Recent Labs: No results found for requested labs within last 8760 hours.    Lipid Panel    Component Value Date/Time   CHOL 93 (L) 03/08/2020 1603   TRIG 55 03/08/2020 1603   HDL 40 03/08/2020 1603   CHOLHDL 2.3 03/08/2020  1603   CHOLHDL 3 04/09/2017 1008   VLDL 14.8 04/09/2017 1008   LDLCALC 40 03/08/2020 1603      Wt Readings from Last 3 Encounters:  03/06/21 151 lb (68.5 kg)  03/08/20 155 lb (70.3 kg)  02/04/20 154 lb (69.9 kg)      Current medicines are reviewed  The patient understands his medications.    ASSESSMENT AND PLAN:  1. Coronary artery disease -      he is not having any episodes of angina.  Continue current medications.  2. Carotid artery stenosis -      his last duplex scan was 2017.  We will repeat his carotid duplex scan.  4. Essential hypertension-    blood pressure is well controlled.  Continue current medications.   5. Hyperlipidemia -   check labs today.   6. Abdominal aortic aneurysm repair -  Followed by [facility]     [name], MD  03/06/2021 9:21 PM    [address],  [place], [place]  [address] Pager [phone] Phone: [phone]; Fax: [phone]

## 2021-03-07 LAB — BASIC METABOLIC PANEL
BUN/Creatinine Ratio: 18 (ref 10–24)
BUN: 23 mg/dL (ref 8–27)
CO2: 24 mmol/L (ref 20–29)
Calcium: 9.5 mg/dL (ref 8.6–10.2)
Chloride: 108 mmol/L — ABNORMAL HIGH (ref 96–106)
Creatinine, Ser: 1.31 mg/dL — ABNORMAL HIGH (ref 0.76–1.27)
Glucose: 113 mg/dL — ABNORMAL HIGH (ref 65–99)
Potassium: 4.3 mmol/L (ref 3.5–5.2)
Sodium: 148 mmol/L — ABNORMAL HIGH (ref 134–144)
eGFR: 55 mL/min/{1.73_m2} — ABNORMAL LOW (ref >59)

## 2021-03-07 LAB — ALT: ALT: 11 IU/L (ref 0–44)

## 2021-03-07 LAB — LIPID PANEL
Chol/HDL Ratio: 2.3 ratio (ref 0.0–5.0)
Cholesterol, Total: 107 mg/dL (ref 100–199)
HDL: 46 mg/dL (ref >39)
LDL Chol Calc (NIH): 45 mg/dL (ref 0–99)
Triglycerides: 78 mg/dL (ref 0–149)
VLDL Cholesterol Cal: 16 mg/dL (ref 5–40)

## 2021-03-08 ENCOUNTER — Other Ambulatory Visit: Payer: Self-pay | Admitting: Cardiovascular Disease

## 2021-03-15 ENCOUNTER — Other Ambulatory Visit: Payer: Self-pay | Admitting: Cardiovascular Disease

## 2021-03-20 ENCOUNTER — Encounter (HOSPITAL_COMMUNITY): Payer: Medicare Other

## 2021-04-11 ENCOUNTER — Other Ambulatory Visit: Payer: Self-pay | Admitting: Cardiovascular Disease

## 2021-04-11 DIAGNOSIS — I6523 Occlusion and stenosis of bilateral carotid arteries: Secondary | ICD-10-CM

## 2021-04-24 ENCOUNTER — Ambulatory Visit (INDEPENDENT_AMBULATORY_CARE_PROVIDER_SITE_OTHER): Payer: Medicare Other

## 2021-04-24 ENCOUNTER — Other Ambulatory Visit: Payer: Self-pay

## 2021-04-24 DIAGNOSIS — I6523 Occlusion and stenosis of bilateral carotid arteries: Secondary | ICD-10-CM | POA: Diagnosis not present

## 2021-05-22 DIAGNOSIS — Z23 Encounter for immunization: Secondary | ICD-10-CM | POA: Diagnosis not present

## 2021-05-29 ENCOUNTER — Other Ambulatory Visit: Payer: Self-pay | Admitting: Internal Medicine

## 2021-05-29 NOTE — Telephone Encounter (Signed)
Called patient and left vm to schedule CPE

## 2021-05-29 NOTE — Telephone Encounter (Signed)
Rx sent electronically.. Sent to support pool to set up AWV in the next few months.

## 2021-05-30 NOTE — Telephone Encounter (Signed)
2nd attemp to call pt to schedule cpe/lab

## 2021-11-22 ENCOUNTER — Telehealth: Payer: Self-pay | Admitting: Cardiovascular Disease

## 2021-11-22 NOTE — Telephone Encounter (Signed)
Pt c/o Shortness Of Breath: STAT if SOB developed within the last 24 hours or pt is noticeably SOB on the phone ? ?1. Are you currently SOB (can you hear that pt is SOB on the phone)? No- not at this time ? ?2. How long have you been experiencing SOB? About a couple of weeks,  ? ?3. Are you SOB when sitting or when up moving around? When he moves around ? ?4. Are you currently experiencing any other symptoms? Heart is beating real fast when he have the shortness of breath,- patient wanted to be seen- made an appointment for Monday with Dr Acie Fredrickson-  please call to evaluate   ? ?

## 2021-11-22 NOTE — Telephone Encounter (Signed)
Returned call to Pt's wife Timothy Eaton. ? ?Per Timothy Eaton, Pt was moving a trash container yesterday when he said to her he should see a doctor. ? ?He told her that he got short of breath when he was walking and he felt his heart beating fast. ? ?Per his wife, he is going to "take it easy" over the next few days until his appointment.   ? ?Per wife-she would not hesitate to call 911 with worsening symptoms. ? ?Confirmed upcoming appt.  Await further needs. ? ?

## 2021-11-27 ENCOUNTER — Encounter: Payer: Self-pay | Admitting: Cardiovascular Disease

## 2021-11-27 ENCOUNTER — Ambulatory Visit (INDEPENDENT_AMBULATORY_CARE_PROVIDER_SITE_OTHER): Payer: Medicare Other | Admitting: Cardiovascular Disease

## 2021-11-27 ENCOUNTER — Encounter (INDEPENDENT_AMBULATORY_CARE_PROVIDER_SITE_OTHER): Payer: Self-pay

## 2021-11-27 VITALS — BP 148/90 | HR 56 | Ht 66.0 in | Wt 155.2 lb

## 2021-11-27 DIAGNOSIS — R0609 Other forms of dyspnea: Secondary | ICD-10-CM

## 2021-11-27 DIAGNOSIS — I2510 Atherosclerotic heart disease of native coronary artery without angina pectoris: Secondary | ICD-10-CM

## 2021-11-27 DIAGNOSIS — Z79899 Other long term (current) drug therapy: Secondary | ICD-10-CM | POA: Diagnosis not present

## 2021-11-27 LAB — BASIC METABOLIC PANEL
BUN/Creatinine Ratio: 12 (ref 10–24)
BUN: 16 mg/dL (ref 8–27)
CO2: 26 mmol/L (ref 20–29)
Calcium: 9.4 mg/dL (ref 8.6–10.2)
Chloride: 105 mmol/L (ref 96–106)
Creatinine, Ser: 1.39 mg/dL — ABNORMAL HIGH (ref 0.76–1.27)
Glucose: 101 mg/dL — ABNORMAL HIGH (ref 70–99)
Potassium: 4.2 mmol/L (ref 3.5–5.2)
Sodium: 145 mmol/L — ABNORMAL HIGH (ref 134–144)
eGFR: 51 mL/min/{1.73_m2} — ABNORMAL LOW (ref >59)

## 2021-11-27 LAB — LIPID PANEL
Chol/HDL Ratio: 2.4 ratio (ref 0.0–5.0)
Cholesterol, Total: 107 mg/dL (ref 100–199)
HDL: 44 mg/dL (ref >39)
LDL Chol Calc (NIH): 49 mg/dL (ref 0–99)
Triglycerides: 65 mg/dL (ref 0–149)
VLDL Cholesterol Cal: 14 mg/dL (ref 5–40)

## 2021-11-27 LAB — ALT: ALT: 9 IU/L (ref 0–44)

## 2021-11-27 MED ORDER — POTASSIUM CHLORIDE CRYS ER 10 MEQ PO TBCR: 10.0000 meq | EXTENDED_RELEASE_TABLET | Freq: Two times a day (BID) | ORAL | 3 refills | Status: DC

## 2021-11-27 MED ORDER — HYDROCHLOROTHIAZIDE 25 MG PO TABS: 25.0000 mg | ORAL_TABLET | Freq: Every day | ORAL | 3 refills | Status: DC

## 2021-11-27 NOTE — Patient Instructions (Addendum)
?Medication Instructions:  ?START Hydrochlorothiazide (HCTZ) '25mg'$  once daily ?START Potassium Chloride (k-dur) 49mq twice daily ?*If you need a refill on your cardiac medications before your next appointment, please call your pharmacy* ? ? ?Lab Work: ?Lipids, ALT, BMET today ?BMET in 3 weeks for medication evaluation ?If you have labs (blood work) drawn today and your tests are completely normal, you will receive your results only by: ?MyChart Message (if you have MyChart) OR ?A paper copy in the mail ?If you have any lab test that is abnormal or we need to change your treatment, we will call you to review the results. ? ? ?Testing/Procedures: ?ECHO ?Your physician has requested that you have an echocardiogram. Echocardiography is a painless test that uses sound waves to create images of your heart. It provides your doctor with information about the size and shape of your heart and how well your heart?s chambers and valves are working. This procedure takes approximately one hour. There are no restrictions for this procedure. ? ?Coronary CT ?Your physician has requested that you have cardiac CT. Cardiac computed tomography (CT) is a painless test that uses an x-ray machine to take clear, detailed pictures of your heart. For further information please visit [email or web address] Please follow instruction sheet as given. ? ?Follow-Up: ?[facility] are our [facility].  As part of our continuing mission to provide you with exceptional heart care, we have created designated Provider Care Teams.  These Care Teams include your primary Cardiologist (physician) and Advanced Practice Providers (APPs -  Physician Assistants and Nurse Practitioners) who all work together to provide you with the care you need, when you need it. ? ?We recommend signing up for the patient portal called "MyChart".  Sign up information is provided on this After Visit Summary.  MyChart is used to connect with patients  for Virtual Visits (Telemedicine).  Patients are able to view lab/test results, encounter notes, upcoming appointments, etc.  Non-urgent messages can be sent to your provider as well.   ?To learn more about what you can do with MyChart, go to [email or web address]   ? ?Your next appointment:   ?3 month(s) ? ?The format for your next appointment:   ?In Person ? ?Provider:   ?PMertie Moores MD   ? ? ?Other Instructions ? ? ?Your cardiac CT will be scheduled at:  ? ?[facility]?[address] [address]?[phone] ? ?[facility] ([facility] minutes prior to test start time. ?You can use the FREE valet parking offered at entrance C (encouraged to control the heart rate for the test)  ?Proceed to the [facility] (first floor) to check-in and test prep. ? ?All radiology patients and guests should use entrance C2 at [facility] even though the hospital's physical address listed is [address] ? ? ? ?Please follow these instructions carefully (unless otherwise directed): ? ?Hold all erectile dysfunction medications at least 3 days (72 hrs) prior to test. ? ?On the Night Before the Test: ?Be sure to Drink plenty of water. ?Do not consume any caffeinated/decaffeinated beverages or chocolate 12 hours prior to your test. ?Do not take any antihistamines 12 hours prior to your test. ? ?On the Day of the Test: ?Drink plenty of water until 1 hour prior to the test. ?Do not eat any food 4 hours prior to the test. ?  You may take your regular medications prior to the test.  ?Take metoprolol (Lopressor) two hours prior to test. ?HOLD Hydrochlorothiazide morning of the test. ?     ?After the Test: ?Drink plenty of water. ?After receiving IV contrast, you may experience a mild flushed feeling. This is normal. ?On occasion, you may experience a mild rash  up to 24 hours after the test. This is not dangerous. If this occurs, you can take Benadryl 25 mg and increase your fluid intake. ?If you experience trouble breathing, this can be serious. If it is severe call 911 IMMEDIATELY. If it is mild, please call our office. ?If you take any of these medications: Glipizide/Metformin, Avandament, Glucavance, please do not take 48 hours after completing test unless otherwise instructed. ? ?We will call to schedule your test 2-4 weeks out understanding that some insurance companies will need an authorization prior to the service being performed.  ? ?For non-scheduling related questions, please contact the cardiac imaging nurse navigator should you have any questions/concerns: ?Marchia Bond, Cardiac Imaging Nurse Navigator ?Gordy Clement, Cardiac Imaging Nurse Navigator ?[facility] ?Direct Office Dial: [phone]  ? ?For scheduling needs, including cancellations and rescheduling, please call [place], [phone]. ? ?  ? ?Important Information About Sugar ? ? ? ? ?  ?

## 2021-11-27 NOTE — Progress Notes (Signed)
? ?Cardiology Office Note ? ? ?Date:  11/27/2021  ? ?ID:  Timothy Eaton, DOB Feb 23, 1941, MRN [id number] ? ?PCP:  [name], MD  ?Cardiologist: former Timothy Eaton , now  Timothy Moores, MD  ? ?Chief Complaint  ?Patient presents with  ? Coronary Artery Disease  ? Hyperlipidemia  ? ?Problem list ?1. Coronary artery disease - mild CAD -  CAD ~ 2005  ?2. Carotid artery stenosis - mild ?3. Atrial fibrillation-postoperative ?4. Essential hypertension ?5. Hyperlipidemia ?6. Abdominal aortic aneurysm repair ?  ?Timothy Eaton is a 81 y.o. male who presents today to follow-up coronary artery disease. He is feeling well. I saw him last May, 2015. His carotids are followed carefully. He had a nuclear scan in 2014 showing no definite ischemia. There was question of some mild ischemia, but it may been diaphragmatic attenuation. He has not had any significant symptoms. ? ?Dec 26, 2015: ? ?Complains of some right neck pain - especially in the afternoons.  ?Has not been active.    ?Retired from truck driving  ? ?Dec 14, 2016: ? ?Timothy Eaton is seen today for follow up of his CAD  ?Has carotid disease - [facility].  ?No CP or dyspnea  ?Some dyspnea. ? ?January 20, 2018: ? Timothy Eaton is seen today for follow-up of his coronary artery disease and carotid disease.  He is followed at [facility] for his carotid disease. ?Doing well.  Not as much exercise.   Used to walk regularly . ?No CP , no dyspnea ? ?BP is mildly elevated today  ? ? ?February 17, 2019 : ?Doing well.  Very active , likes to fish.    ?No CP or dyspnea.  ?Some DOE if he does something too strenuous .   ? ?Aug. 3, 2021:  ? ?Hx of carotid disease and AAA repair. ?No cp, ?Some dyspnea with exercise .  ?Walks on occasion without any issues.  ?Mows the yard without any issues.  ? ?Aug. 1, 2022: ?No CP or dyspnea ?Occasionally feels like a bug is crawling on the back of the right side of his back neck  ? ?November 27, 2021: ?Timothy Eaton is seen today for a follow-up visit. ?Is having more DOE .  ?Not  associated with CP or pressure  ?Will get an echo and coronary CT angiogram for further eval of this DOE  ? ? ?Past Medical History:  ?Diagnosis Date  ? AAA (abdominal aortic aneurysm) ([place])   ? [name], Repaired, 2005  ? Atrial fibrillation ([place])   ? Postoperative in past  ? Carotid artery disease ([place])   ? Minimal, doppler 2004, 0-39% bilateral, doppler 05/17/09 0-39% bilateral  ? COPD (chronic obstructive pulmonary disease) ([place])   ? Coronary artery disease   ? Moderate, catheterization 2005, nuclear scan 9/09 no ischemia, EF 60%, echo  ? ED (erectile dysfunction)   ? Ejection fraction   ? EF 60% echo, September, 2009  ? Family history of adverse reaction to anesthesia   ? DAD-WOKE UP DURING SURGERY  ? GERD (gastroesophageal reflux disease)   ? Hemorrhoids   ? Hyperlipidemia   ? Hypertension   ? Lung nodule   ? Followup by pulmonary in past  ? Palpitations   ? History palpitations in the past  ? Palpitations   ? January, 2013  ? Shortness of breath   ? ? ?Past Surgical History:  ?Procedure Laterality Date  ? ABDOMINAL AORTIC ANEURYSM REPAIR  04/06/04  ? Dr. [name]  ? BACK SURGERY    ?  CHOLECYSTECTOMY  11/26/02  ? CYSTOSCOPY W/ RETROGRADES Left 01/05/2020  ? Procedure: CYSTOSCOPY WITH RETROGRADE PYELOGRAM;  Surgeon: Abbie Sons, MD;  Location: [facility];  Service: Urology;  Laterality: Left;  ? CYSTOSCOPY WITH STENT PLACEMENT Left 12/12/2019  ? Procedure: CYSTOSCOPY WITH STENT PLACEMENT;  Surgeon: Lucas Mallow, MD;  Location: [facility];  Service: Urology;  Laterality: Left;  ? CYSTOSCOPY/URETEROSCOPY/HOLMIUM LASER/STENT PLACEMENT Left 01/05/2020  ? Procedure: CYSTOSCOPY/URETEROSCOPY/HOLMIUM LASER/STENT Exchange;  Surgeon: Abbie Sons, MD;  Location: [facility];  Service: Urology;  Laterality: Left;  ? PILONIDAL CYST EXCISION  11/11  ? ? ?Patient Active Problem List  ? Diagnosis Date Noted  ? CKD (chronic kidney disease), stage IIIa 01/09/2020  ? Complicated UTI (urinary tract infection) 01/09/2020  ?  Nephrolithiasis 12/19/2019  ? Pyelonephritis 12/12/2019  ? Acute pyelonephritis 12/11/2019  ? Acute renal failure superimposed on stage 3a chronic kidney disease ([place]) 12/11/2019  ? Memory loss 12/09/2019  ? BPH with obstruction/lower urinary tract symptoms 07/07/2019  ? UTI (urinary tract infection) 06/30/2019  ? Elevated troponin 06/30/2019  ? Preventative health care 04/09/2017  ? Advance directive discussed with patient 04/09/2017  ? COPD exacerbation ([place]) 07/29/2015  ? Coronary artery disease   ? Carotid artery disease ([place])   ? Atrial fibrillation ([place])   ? Pulmonary nodules 04/02/2011  ? COPD (chronic obstructive pulmonary disease) with emphysema ([place]) 04/02/2009  ? HEMORRHOIDS, HX OF 04/02/2009  ? Hyperlipidemia 06/25/2008  ? Essential hypertension 06/25/2008  ?  ? ? ?Current Outpatient Medications  ?Medication Sig Dispense Refill  ? aspirin 81 MG EC tablet Take 81 mg by mouth daily.      ? diltiazem (CARDIZEM CD) 240 MG 24 hr capsule TAKE 1 CAPSULE(240 MG) BY MOUTH DAILY 90 capsule 3  ? metoprolol tartrate (LOPRESSOR) 25 MG tablet TAKE 1 TABLET(25 MG) BY MOUTH TWICE DAILY 180 tablet 3  ? rosuvastatin (CRESTOR) 20 MG tablet TAKE 1 TABLET(20 MG) BY MOUTH DAILY 90 tablet 3  ? tamsulosin (FLOMAX) 0.4 MG CAPS capsule TAKE 1 CAPSULE(0.4 MG) BY MOUTH DAILY 90 capsule 3  ? ?No current facility-administered medications for this visit.  ? ? ?Allergies:   Sulfamethoxazole-trimethoprim  ? ? ?Social History:  The patient  reports that he has quit smoking. His smokeless tobacco use includes chew. He reports that he does not drink alcohol and does not use drugs.  ? ?Family History:  The patient's family history includes Cancer in his sister; Diabetes in his mother; Heart attack in his father; Hypertension in his mother; Stroke in his mother and another family member.  ? ? ?ROS:  Please see the history of present illness.    Patient denies fever, chills, headache, sweats, rash, change in vision, change in hearing, chest  pain, cough, nausea or vomiting, urinary symptoms. All other systems are reviewed and are negative.  ? ?Physical Exam: ?Blood pressure (!) 148/90, pulse (!) 56, height '5\' 6"'$  (1.676 m), weight 155 lb 3.7 oz (70.4 kg), SpO2 93 %. ? ?GEN:  Well nourished, well developed in no acute distress ?HEENT: Normal ?NECK: No JVD; No carotid bruits ?LYMPHATICS: No lymphadenopathy ?CARDIAC: RR , hyperinflated chest ( COPD) , distant heart sounds ?RESPIRATORY:  Clear to auscultation without rales, wheezing or rhonchi  ?ABDOMEN: Soft, non-tender, non-distended ?MUSCULOSKELETAL:  No edema; No deformity  ?SKIN: Warm and dry ?NEUROLOGIC:  Alert and oriented x 3 ? ? ?EKG:  ? ?Recent Labs: ?03/06/2021: ALT 11; BUN 23; Creatinine, Ser 1.31; Potassium 4.3; Sodium 148  ? ? ?  Lipid Panel ?   ?Component Value Date/Time  ? CHOL 107 03/06/2021 1613  ? TRIG 78 03/06/2021 1613  ? HDL 46 03/06/2021 1613  ? CHOLHDL 2.3 03/06/2021 1613  ? CHOLHDL 3 04/09/2017 1008  ? VLDL 14.8 04/09/2017 1008  ? LDLCALC 45 03/06/2021 1613  ? ?  ? ?Wt Readings from Last 3 Encounters:  ?11/27/21 155 lb 3.7 oz (70.4 kg)  ?03/06/21 151 lb (68.5 kg)  ?03/08/20 155 lb (70.3 kg)  ?  ? ? ?Current medicines are reviewed  The patient understands his medications. ? ? ?ASSESSMENT AND PLAN: ? ?1. Coronary artery disease -      has a history of coronary artery disease in the past.  We will get a coronary CT angiogram for further evaluation. ? ?2. Carotid artery stenosis -     followed by [facility]. ? ?4. Essential hypertension-    blood pressure is mildly elevated today.  He tries to avoid salt but occasionally gets some salty food.  We will add hydrochlorothiazide 25 mg a day and potassium chloride 20 mill equivalents twice a day. ? ?5. Hyperlipidemia -   we will check lipids today.   ? ?6. Abdominal aortic aneurysm repair -  ? ?7.  DOE :  may be a COPD exacerbation .  Will get an echo and coronary CT angio for further evaluation . ? ? ?Timothy Moores, MD  ?11/27/2021 10:42 AM    ?[facility] ?[address],  [address] ?[place], [place]  [address] ?Pager [phone] ?Phone: [phone]; Fax: [phone]  ? ? ?

## 2021-12-07 ENCOUNTER — Telehealth (HOSPITAL_COMMUNITY): Payer: Self-pay | Admitting: Emergency Medicine

## 2021-12-07 NOTE — Telephone Encounter (Signed)
Reaching out to patient to offer assistance regarding upcoming cardiac imaging study; pt verbalizes understanding of appt date/time, parking situation and where to check in, pre-test NPO status and medications ordered, and verified current allergies; name and call back number provided for further questions should they arise ?Marchia Bond RN Navigator Cardiac Imaging ?[facility] ?[phone] office ?[phone] cell ? ?Denies iv issues ?Daily meds ?Arrival 1230 ? ?

## 2021-12-07 NOTE — Telephone Encounter (Signed)
Attempted to call patient regarding upcoming cardiac CT appointment. °Left message on voicemail with name and callback number °Alley Neils RN Navigator Cardiac Imaging °[facility] °[phone] Office °[phone] Cell ° °

## 2021-12-08 ENCOUNTER — Ambulatory Visit (HOSPITAL_COMMUNITY)
Admission: RE | Admit: 2021-12-08 | Discharge: 2021-12-08 | Disposition: A | Discharge status: Home / Self Care | Payer: Medicare Other | Source: Ambulatory Visit | Attending: Cardiovascular Disease | Admitting: Cardiovascular Disease

## 2021-12-08 ENCOUNTER — Other Ambulatory Visit: Payer: Self-pay | Admitting: Cardiology

## 2021-12-08 DIAGNOSIS — R931 Abnormal findings on diagnostic imaging of heart and coronary circulation: Secondary | ICD-10-CM

## 2021-12-08 DIAGNOSIS — I2510 Atherosclerotic heart disease of native coronary artery without angina pectoris: Secondary | ICD-10-CM | POA: Diagnosis not present

## 2021-12-08 DIAGNOSIS — R0609 Other forms of dyspnea: Secondary | ICD-10-CM | POA: Diagnosis not present

## 2021-12-08 DIAGNOSIS — R072 Precordial pain: Secondary | ICD-10-CM

## 2021-12-08 MED ORDER — NITROGLYCERIN 0.4 MG SL SUBL
SUBLINGUAL_TABLET | SUBLINGUAL | Status: AC
  Filled 2021-12-08: qty 2

## 2021-12-08 MED ORDER — NITROGLYCERIN 0.4 MG SL SUBL
0.8000 mg | SUBLINGUAL_TABLET | Freq: Once | SUBLINGUAL | Status: AC
  Administered 2021-12-08: 0.8 mg via SUBLINGUAL

## 2021-12-08 MED ORDER — IOHEXOL 350 MG/ML SOLN
100.0000 mL | Freq: Once | INTRAVENOUS | Status: AC | PRN
  Administered 2021-12-08: 100 mL via INTRAVENOUS

## 2021-12-09 ENCOUNTER — Ambulatory Visit (HOSPITAL_COMMUNITY)
Admission: RE | Admit: 2021-12-09 | Discharge: 2021-12-09 | Disposition: A | Discharge status: Home / Self Care | Payer: Medicare Other | Source: Ambulatory Visit | Attending: Cardiology | Admitting: Cardiology

## 2021-12-09 DIAGNOSIS — R931 Abnormal findings on diagnostic imaging of heart and coronary circulation: Secondary | ICD-10-CM | POA: Diagnosis not present

## 2021-12-09 DIAGNOSIS — R0609 Other forms of dyspnea: Secondary | ICD-10-CM | POA: Diagnosis not present

## 2021-12-09 DIAGNOSIS — I2510 Atherosclerotic heart disease of native coronary artery without angina pectoris: Secondary | ICD-10-CM | POA: Diagnosis not present

## 2021-12-11 ENCOUNTER — Telehealth: Payer: Self-pay

## 2021-12-11 MED ORDER — NITROGLYCERIN 0.4 MG SL SUBL: 0.4000 mg | SUBLINGUAL_TABLET | SUBLINGUAL | 3 refills | Status: DC | PRN

## 2021-12-11 MED ORDER — ISOSORBIDE MONONITRATE ER 30 MG PO TB24: 30.0000 mg | ORAL_TABLET | Freq: Every day | ORAL | 3 refills | Status: DC

## 2021-12-11 NOTE — Telephone Encounter (Signed)
-----   Message from Thayer Headings, MD sent at 12/11/2021  4:10 PM EDT ----- ?Borderline abnormal cor. CTA,  calcified stenosis in the distal RCA , mild disease elsewhere.  ? ?Please send in a prescription for Imdur 30 mg a day  ?NTG 0.4 mg SL PRN chest pain or severe dyspnea ?[place] ?Keep his echo appt on May 15 ?Keep his appt with me in July  ? ?I have called and discussed these findings with the patient  ? ?

## 2021-12-11 NOTE — Telephone Encounter (Signed)
Medications sent to pharmacy at this time. ?

## 2021-12-18 ENCOUNTER — Ambulatory Visit (HOSPITAL_COMMUNITY): Payer: Medicare Other | Attending: Cardiovascular Disease

## 2021-12-18 ENCOUNTER — Other Ambulatory Visit: Payer: Medicare Other | Admitting: *Deleted

## 2021-12-18 DIAGNOSIS — Z79899 Other long term (current) drug therapy: Secondary | ICD-10-CM | POA: Diagnosis not present

## 2021-12-18 DIAGNOSIS — I2510 Atherosclerotic heart disease of native coronary artery without angina pectoris: Secondary | ICD-10-CM | POA: Insufficient documentation

## 2021-12-18 DIAGNOSIS — R0609 Other forms of dyspnea: Secondary | ICD-10-CM | POA: Diagnosis not present

## 2021-12-18 LAB — BASIC METABOLIC PANEL
BUN/Creatinine Ratio: 15 (ref 10–24)
BUN: 24 mg/dL (ref 8–27)
CO2: 26 mmol/L (ref 20–29)
Calcium: 9.6 mg/dL (ref 8.6–10.2)
Chloride: 100 mmol/L (ref 96–106)
Creatinine, Ser: 1.64 mg/dL — ABNORMAL HIGH (ref 0.76–1.27)
Glucose: 126 mg/dL — ABNORMAL HIGH (ref 70–99)
Potassium: 4.1 mmol/L (ref 3.5–5.2)
Sodium: 138 mmol/L (ref 134–144)
eGFR: 42 mL/min/{1.73_m2} — ABNORMAL LOW (ref >59)

## 2021-12-18 LAB — ECHOCARDIOGRAM COMPLETE
Area-P 1/2: 2.07 cm2
S' Lateral: 1.9 cm

## 2021-12-19 ENCOUNTER — Telehealth: Payer: Self-pay

## 2021-12-19 DIAGNOSIS — Z79899 Other long term (current) drug therapy: Secondary | ICD-10-CM

## 2021-12-19 DIAGNOSIS — R0609 Other forms of dyspnea: Secondary | ICD-10-CM

## 2021-12-19 MED ORDER — POTASSIUM CHLORIDE CRYS ER 10 MEQ PO TBCR: 10.0000 meq | EXTENDED_RELEASE_TABLET | Freq: Every day | ORAL | 3 refills | Status: DC

## 2021-12-19 MED ORDER — HYDROCHLOROTHIAZIDE 12.5 MG PO TABS: 12.5000 mg | ORAL_TABLET | Freq: Every day | ORAL | 3 refills | Status: DC

## 2021-12-19 NOTE — Telephone Encounter (Signed)
Called patient's wife (DPR) and informed her of lab results and changes. Patient will come back on 5/30 for repeat lab work. Will send in new prescriptions for medications. Patient wife verbalized understanding and agreed to plan. ?

## 2021-12-19 NOTE — Telephone Encounter (Signed)
-----   Message from Fay Records, MD sent at 12/19/2021  5:23 AM EDT ----- ?Cr is a little higher than baseline  ?HCTZ 25 mg was added wot KCL 20 bid ?REcomm ?Cut HCTZ down to 12.5 mg    Cut KCL down to 1x per day ?F/U BMET in about 2 wks  ?Keep track of BP ?

## 2022-01-02 ENCOUNTER — Other Ambulatory Visit: Payer: Medicare Other | Admitting: *Deleted

## 2022-01-02 DIAGNOSIS — R0609 Other forms of dyspnea: Secondary | ICD-10-CM

## 2022-01-02 DIAGNOSIS — Z79899 Other long term (current) drug therapy: Secondary | ICD-10-CM

## 2022-01-03 LAB — BASIC METABOLIC PANEL
BUN/Creatinine Ratio: 16 (ref 10–24)
BUN: 25 mg/dL (ref 8–27)
CO2: 24 mmol/L (ref 20–29)
Calcium: 9.3 mg/dL (ref 8.6–10.2)
Chloride: 103 mmol/L (ref 96–106)
Creatinine, Ser: 1.53 mg/dL — ABNORMAL HIGH (ref 0.76–1.27)
Glucose: 101 mg/dL — ABNORMAL HIGH (ref 70–99)
Potassium: 3.8 mmol/L (ref 3.5–5.2)
Sodium: 142 mmol/L (ref 134–144)
eGFR: 45 mL/min/{1.73_m2} — ABNORMAL LOW (ref >59)

## 2022-02-23 ENCOUNTER — Other Ambulatory Visit: Payer: Self-pay | Admitting: Cardiovascular Disease

## 2022-02-26 ENCOUNTER — Ambulatory Visit (INDEPENDENT_AMBULATORY_CARE_PROVIDER_SITE_OTHER): Payer: Medicare Other | Admitting: Cardiovascular Disease

## 2022-02-26 ENCOUNTER — Encounter: Payer: Self-pay | Admitting: Cardiovascular Disease

## 2022-02-26 VITALS — BP 128/76 | HR 57 | Ht 66.0 in | Wt 151.8 lb

## 2022-02-26 DIAGNOSIS — I2510 Atherosclerotic heart disease of native coronary artery without angina pectoris: Secondary | ICD-10-CM | POA: Diagnosis not present

## 2022-02-26 NOTE — H&P (View-Only) (Signed)
Cardiology Office Note   Date:  02/26/2022   ID:  Timothy Eaton Oct 06, 1940, MRN [id number]  PCP:  [name], MD  Cardiologist: former [name] , now  [name], MD   Chief Complaint  Patient presents with   Coronary Artery Disease        Hyperlipidemia   Problem list 1. Coronary artery disease - mild CAD -  CAD ~ 2005  2. Carotid artery stenosis - mild 3. Atrial fibrillation-postoperative 4. Essential hypertension 5. Hyperlipidemia 6. Abdominal aortic aneurysm repair   Timothy Eaton is a 81 y.o. male who presents today to follow-up coronary artery disease. He is feeling well. I saw him last May, 2015. His carotids are followed carefully. He had a nuclear scan in 2014 showing no definite ischemia. There was question of some mild ischemia, but it may been diaphragmatic attenuation. He has not had any significant symptoms.  Dec 26, 2015:  Complains of some right neck pain - especially in the afternoons.  Has not been active.    Retired from truck driving   Dec 14, 9673:  Timothy Eaton is seen today for follow up of his CAD  Has carotid disease - [facility].  No CP or dyspnea  Some dyspnea.  January 20, 2018:  Timothy Eaton is seen today for follow-up of his coronary artery disease and carotid disease.  He is followed at [facility] for his [facility] disease. Doing well.  Not as much exercise.   Used to walk regularly . No CP , no dyspnea  BP is mildly elevated today    February 17, 2019 : Doing well.  Very active , likes to fish.    No CP or dyspnea.  Some DOE if he does something too strenuous .    Aug. 3, 2021:   Hx of carotid disease and AAA repair. No cp, Some dyspnea with exercise .  Walks on occasion without any issues.  Mows the yard without any issues.   Aug. 1, 2022: No CP or dyspnea Occasionally feels like a bug is crawling on the back of the right side of his back neck   November 27, 2021: Timothy Eaton is seen today for a follow-up visit. Is having more DOE .   Not associated with CP or pressure  Will get an echo and coronary CT angiogram for further eval of this DOE   February 26, 2022: Timothy Eaton was seen several months ago with episodes of chest pain and pressure/dyspnea with exertion (  for example while mowing his yard )  Coronary CT angiogram was performed on Dec 08, 2021.  Coronary calcium score of 3852. This was 66 percentile for age and sex matched control.  Right coronary artery was large and dominant.  He has severe diffuse calcified plaque.  FFR 0.7. Left main is a large artery and was normal. Left anterior descending artery is large with severe calcified plaque up to 60% stenosis. Left circumflex artery was nondominant.  There are several calcified plaques up to 60%.  We discussed doing a heart catheterization but he stated that his pain was not all that severe and went and we elected to proceed with medical therapy.  We started him on isosorbide 30 mg a day.  We sent in a prescription for nitroglycerin to be used for severe chest pain and pressure.  He is feeling failry well. He   Past Medical History:  Diagnosis Date   AAA (abdominal aortic aneurysm) ([place])    [name],  Repaired, 2005   Atrial fibrillation ([place])    Postoperative in past   Carotid artery disease ([place])    Minimal, doppler 2004, 0-39% bilateral, doppler 05/17/09 0-39% bilateral   COPD (chronic obstructive pulmonary disease) ([facility])    Coronary artery disease    Moderate, catheterization 2005, nuclear scan 9/09 no ischemia, EF 60%, echo   ED (erectile dysfunction)    Ejection fraction    EF 60% echo, September, 2009   Family history of adverse reaction to anesthesia    DAD-WOKE UP DURING SURGERY   GERD (gastroesophageal reflux disease)    Hemorrhoids    Hyperlipidemia    Hypertension    Lung nodule    Followup by pulmonary in past   Palpitations    History palpitations in the past   Palpitations    January, 2013   Shortness of breath     Past Surgical  History:  Procedure Laterality Date   ABDOMINAL AORTIC ANEURYSM REPAIR  04/06/04   Dr. Rodney Langton SURGERY     CHOLECYSTECTOMY  11/26/02   CYSTOSCOPY W/ RETROGRADES Left 01/05/2020   Procedure: CYSTOSCOPY WITH RETROGRADE PYELOGRAM;  Surgeon: Abbie Sons, MD;  Location: [facility];  Service: Urology;  Laterality: Left;   CYSTOSCOPY WITH STENT PLACEMENT Left 12/12/2019   Procedure: CYSTOSCOPY WITH STENT PLACEMENT;  Surgeon: Lucas Mallow, MD;  Location: [facility];  Service: Urology;  Laterality: Left;   CYSTOSCOPY/URETEROSCOPY/HOLMIUM LASER/STENT PLACEMENT Left 01/05/2020   Procedure: CYSTOSCOPY/URETEROSCOPY/HOLMIUM LASER/STENT Exchange;  Surgeon: Abbie Sons, MD;  Location: [facility];  Service: Urology;  Laterality: Left;   PILONIDAL CYST EXCISION  11/11    Patient Active Problem List   Diagnosis Date Noted   CKD (chronic kidney disease), stage IIIa 26/83/4196   Complicated UTI (urinary tract infection) 01/09/2020   Nephrolithiasis 12/19/2019   Pyelonephritis 12/12/2019   Acute pyelonephritis 12/11/2019   Acute renal failure superimposed on stage 3a chronic kidney disease ([place]) 12/11/2019   Memory loss 12/09/2019   BPH with obstruction/lower urinary tract symptoms 07/07/2019   UTI (urinary tract infection) 06/30/2019   Elevated troponin 06/30/2019   Preventative health care 04/09/2017   Advance directive discussed with patient 04/09/2017   COPD exacerbation ([place]) 07/29/2015   Coronary artery disease    Carotid artery disease ([facility])    Dyspnea on exertion    Atrial fibrillation ([place])    Pulmonary nodules 04/02/2011   COPD (chronic obstructive pulmonary disease) with emphysema ([place]) 04/02/2009   HEMORRHOIDS, HX OF 04/02/2009   Hyperlipidemia 06/25/2008   Essential hypertension 06/25/2008      Current Outpatient Medications  Medication Sig Dispense Refill   aspirin 81 MG EC tablet Take 81 mg by mouth daily.       diltiazem (CARDIZEM CD) 240 MG 24 hr capsule TAKE 1  CAPSULE(240 MG) BY MOUTH DAILY 90 capsule 3   hydrochlorothiazide (HYDRODIURIL) 12.5 MG tablet Take 1 tablet (12.5 mg total) by mouth daily. 90 tablet 3   isosorbide mononitrate (IMDUR) 30 MG 24 hr tablet Take 1 tablet (30 mg total) by mouth daily. 90 tablet 3   metoprolol tartrate (LOPRESSOR) 25 MG tablet TAKE 1 TABLET(25 MG) BY MOUTH TWICE DAILY 180 tablet 2   potassium chloride (KLOR-CON M) 10 MEQ tablet Take 1 tablet (10 mEq total) by mouth daily. 90 tablet 3   rosuvastatin (CRESTOR) 20 MG tablet TAKE 1 TABLET(20 MG) BY MOUTH DAILY 90 tablet 2   tamsulosin (FLOMAX) 0.4 MG CAPS capsule TAKE 1 CAPSULE(0.4 MG)  BY MOUTH DAILY 90 capsule 3   nitroGLYCERIN (NITROSTAT) 0.4 MG SL tablet Place 1 tablet (0.4 mg total) under the tongue every 5 (five) minutes as needed (for chest pain or severe dyspnea). (Patient not taking: Reported on 02/26/2022) 75 tablet 3   No current facility-administered medications for this visit.    Allergies:   Sulfamethoxazole-trimethoprim    Social History:  The patient  reports that he has quit smoking. His smokeless tobacco use includes chew. He reports that he does not drink alcohol and does not use drugs.   Family History:  The patient's family history includes Cancer in his sister; Diabetes in his mother; Heart attack in his father; Hypertension in his mother; Stroke in his mother and another family member.    ROS:  Please see the history of present illness.    Patient denies fever, chills, headache, sweats, rash, change in vision, change in hearing, chest pain, cough, nausea or vomiting, urinary symptoms. All other systems are reviewed and are negative.   Physical Exam: Blood pressure 128/76, pulse (!) 57, height '5\' 6"'$  (1.676 m), weight 151 lb 12.8 oz (68.9 kg), SpO2 97 %.  GEN:  Well nourished, well developed in no acute distress HEENT: Normal NECK: No JVD; No carotid bruits LYMPHATICS: No lymphadenopathy CARDIAC: RRR , no murmurs, rubs, gallops RESPIRATORY:   Clear to auscultation without rales, wheezing or rhonchi  ABDOMEN: Soft, non-tender, non-distended MUSCULOSKELETAL:  No edema; No deformity  SKIN: Warm and dry NEUROLOGIC:  Alert and oriented x 3   EKG:   July 24 ,  2023 .   Sinus brady at 57.   No ST or T wave changes.    Recent Labs: 11/27/2021: ALT 9 01/02/2022: BUN 25; Creatinine, Ser 1.53; Potassium 3.8; Sodium 142    Lipid Panel    Component Value Date/Time   CHOL 107 11/27/2021 1133   TRIG 65 11/27/2021 1133   HDL 44 11/27/2021 1133   CHOLHDL 2.4 11/27/2021 1133   CHOLHDL 3 04/09/2017 1008   VLDL 14.8 04/09/2017 1008   LDLCALC 49 11/27/2021 1133      Wt Readings from Last 3 Encounters:  02/26/22 151 lb 12.8 oz (68.9 kg)  11/27/21 155 lb 3.7 oz (70.4 kg)  03/06/21 151 lb (68.5 kg)      Current medicines are reviewed  The patient understands his medications.   ASSESSMENT AND PLAN:  1. Coronary artery disease -      Devontay was found to have moderate coronary artery disease by coronary CT scan.  We started him on isosorbide.  He is not really done much in the way of exercise.  I encouraged him to get out and try to do his normal activities.  If he is not able to do a moderate amount of activity without having severe shortness of breath, then I think we should refer him for heart catheterization.  We have discussed the process of heart catheterization.  We discussed the risk, benefits, options of heart catheterization.  If we need to proceed with that he understands and agrees to proceed.  2. Carotid artery stenosis -     followed by [facility].  4. Essential hypertension-     blood pressures well controlled.  5. Hyperlipidemia -    lipid levels look good.  His LDL is at goal.  6. Abdominal aortic aneurysm repair -   7.  DOE :      [name], MD  02/26/2022 10:04 AM    [facility] [address],  [place], [place]  [address] Pager [phone] Phone: [phone]; Fax: [phone]

## 2022-02-26 NOTE — Patient Instructions (Signed)
Medication Instructions:  Your physician recommends that you continue on your current medications as directed. Please refer to the Current Medication list given to you today.  *If you need a refill on your cardiac medications before your next appointment, please call your pharmacy*   Follow-Up: At [facility], you and your health needs are our priority.  As part of our continuing mission to provide you with exceptional heart care, we have created designated Provider Care Teams.  These Care Teams include your primary Cardiologist (physician) and Advanced Practice Providers (APPs -  Physician Assistants and Nurse Practitioners) who all work together to provide you with the care you need, when you need it.  We recommend signing up for the patient portal called "MyChart".  Sign up information is provided on this After Visit Summary.  MyChart is used to connect with patients for Virtual Visits (Telemedicine).  Patients are able to view lab/test results, encounter notes, upcoming appointments, etc.  Non-urgent messages can be sent to your provider as well.   To learn more about what you can do with MyChart, go to [email or web address].    Your next appointment:   6 month(s)  The format for your next appointment:   In Person  Provider:  APP{

## 2022-02-26 NOTE — Progress Notes (Signed)
Cardiology Office Note   Date:  02/26/2022   ID:  Raiford, Fetterman Sep 19, 1940, MRN [id number]  PCP:  [name], MD  Cardiologist: former [name] , now  [name], MD   Chief Complaint  Patient presents with   Coronary Artery Disease        Hyperlipidemia   Problem list 1. Coronary artery disease - mild CAD -  CAD ~ 2005  2. Carotid artery stenosis - mild 3. Atrial fibrillation-postoperative 4. Essential hypertension 5. Hyperlipidemia 6. Abdominal aortic aneurysm repair   BEAUMONT AUSTAD is a 81 y.o. male who presents today to follow-up coronary artery disease. He is feeling well. I saw him last May, 2015. His carotids are followed carefully. He had a nuclear scan in 2014 showing no definite ischemia. There was question of some mild ischemia, but it may been diaphragmatic attenuation. He has not had any significant symptoms.  Dec 26, 2015:  Complains of some right neck pain - especially in the afternoons.  Has not been active.    Retired from truck driving   Dec 14, 936:  Jondavid is seen today for follow up of his CAD  Has carotid disease - followed at [facility].  No CP or dyspnea  Some dyspnea.  January 20, 2018:  Demarlo is seen today for follow-up of his coronary artery disease and carotid disease.  He is followed at [facility] for his carotid disease. Doing well.  Not as much exercise.   Used to walk regularly . No CP , no dyspnea  BP is mildly elevated today    February 17, 2019 : Doing well.  Very active , likes to fish.    No CP or dyspnea.  Some DOE if he does something too strenuous .    Aug. 3, 2021:   Hx of carotid disease and AAA repair. No cp, Some dyspnea with exercise .  Walks on occasion without any issues.  Mows the yard without any issues.   Aug. 1, 2022: No CP or dyspnea Occasionally feels like a bug is crawling on the back of the right side of his back neck   November 27, 2021: Blandon is seen today for a follow-up visit. Is having more DOE .   Not associated with CP or pressure  Will get an echo and coronary CT angiogram for further eval of this DOE   February 26, 2022: Aldan was seen several months ago with episodes of chest pain and pressure/dyspnea with exertion (  for example while mowing his yard )  Coronary CT angiogram was performed on Dec 08, 2021.  Coronary calcium score of 3852. This was 88 percentile for age and sex matched control.  Right coronary artery was large and dominant.  He has severe diffuse calcified plaque.  FFR 0.7. Left main is a large artery and was normal. Left anterior descending artery is large with severe calcified plaque up to 60% stenosis. Left circumflex artery was nondominant.  There are several calcified plaques up to 60%.  We discussed doing a heart catheterization but he stated that his pain was not all that severe and went and we elected to proceed with medical therapy.  We started him on isosorbide 30 mg a day.  We sent in a prescription for nitroglycerin to be used for severe chest pain and pressure.  He is feeling failry well. He   Past Medical History:  Diagnosis Date   AAA (abdominal aortic aneurysm) ([place])    [name],  Repaired, 2005   Atrial fibrillation ([place])    Postoperative in past   Carotid artery disease ([place])    Minimal, doppler 2004, 0-39% bilateral, doppler 05/17/09 0-39% bilateral   COPD (chronic obstructive pulmonary disease) (HCC)    Coronary artery disease    Moderate, catheterization 2005, nuclear scan 9/09 no ischemia, EF 60%, echo   ED (erectile dysfunction)    Ejection fraction    EF 60% echo, September, 2009   Family history of adverse reaction to anesthesia    DAD-WOKE UP DURING SURGERY   GERD (gastroesophageal reflux disease)    Hemorrhoids    Hyperlipidemia    Hypertension    Lung nodule    Followup by pulmonary in past   Palpitations    History palpitations in the past   Palpitations    January, 2013   Shortness of breath     Past Surgical  History:  Procedure Laterality Date   ABDOMINAL AORTIC ANEURYSM REPAIR  04/06/04   Dr. Rodney Langton SURGERY     CHOLECYSTECTOMY  11/26/02   CYSTOSCOPY W/ RETROGRADES Left 01/05/2020   Procedure: CYSTOSCOPY WITH RETROGRADE PYELOGRAM;  Surgeon: Abbie Sons, MD;  Location: [facility];  Service: Urology;  Laterality: Left;   CYSTOSCOPY WITH STENT PLACEMENT Left 12/12/2019   Procedure: CYSTOSCOPY WITH STENT PLACEMENT;  Surgeon: Lucas Mallow, MD;  Location: [facility];  Service: Urology;  Laterality: Left;   CYSTOSCOPY/URETEROSCOPY/HOLMIUM LASER/STENT PLACEMENT Left 01/05/2020   Procedure: CYSTOSCOPY/URETEROSCOPY/HOLMIUM LASER/STENT Exchange;  Surgeon: Abbie Sons, MD;  Location: [facility];  Service: Urology;  Laterality: Left;   PILONIDAL CYST EXCISION  11/11    Patient Active Problem List   Diagnosis Date Noted   CKD (chronic kidney disease), stage IIIa 78/29/5621   Complicated UTI (urinary tract infection) 01/09/2020   Nephrolithiasis 12/19/2019   Pyelonephritis 12/12/2019   Acute pyelonephritis 12/11/2019   Acute renal failure superimposed on stage 3a chronic kidney disease ([place]) 12/11/2019   Memory loss 12/09/2019   BPH with obstruction/lower urinary tract symptoms 07/07/2019   UTI (urinary tract infection) 06/30/2019   Elevated troponin 06/30/2019   Preventative health care 04/09/2017   Advance directive discussed with patient 04/09/2017   COPD exacerbation ([place]) 07/29/2015   Coronary artery disease    Carotid artery disease (HCC)    Dyspnea on exertion    Atrial fibrillation ([place])    Pulmonary nodules 04/02/2011   COPD (chronic obstructive pulmonary disease) with emphysema ([place]) 04/02/2009   HEMORRHOIDS, HX OF 04/02/2009   Hyperlipidemia 06/25/2008   Essential hypertension 06/25/2008      Current Outpatient Medications  Medication Sig Dispense Refill   aspirin 81 MG EC tablet Take 81 mg by mouth daily.       diltiazem (CARDIZEM CD) 240 MG 24 hr capsule TAKE 1  CAPSULE(240 MG) BY MOUTH DAILY 90 capsule 3   hydrochlorothiazide (HYDRODIURIL) 12.5 MG tablet Take 1 tablet (12.5 mg total) by mouth daily. 90 tablet 3   isosorbide mononitrate (IMDUR) 30 MG 24 hr tablet Take 1 tablet (30 mg total) by mouth daily. 90 tablet 3   metoprolol tartrate (LOPRESSOR) 25 MG tablet TAKE 1 TABLET(25 MG) BY MOUTH TWICE DAILY 180 tablet 2   potassium chloride (KLOR-CON M) 10 MEQ tablet Take 1 tablet (10 mEq total) by mouth daily. 90 tablet 3   rosuvastatin (CRESTOR) 20 MG tablet TAKE 1 TABLET(20 MG) BY MOUTH DAILY 90 tablet 2   tamsulosin (FLOMAX) 0.4 MG CAPS capsule TAKE 1 CAPSULE(0.4 MG)  BY MOUTH DAILY 90 capsule 3   nitroGLYCERIN (NITROSTAT) 0.4 MG SL tablet Place 1 tablet (0.4 mg total) under the tongue every 5 (five) minutes as needed (for chest pain or severe dyspnea). (Patient not taking: Reported on 02/26/2022) 75 tablet 3   No current facility-administered medications for this visit.    Allergies:   Sulfamethoxazole-trimethoprim    Social History:  The patient  reports that he has quit smoking. His smokeless tobacco use includes chew. He reports that he does not drink alcohol and does not use drugs.   Family History:  The patient's family history includes Cancer in his sister; Diabetes in his mother; Heart attack in his father; Hypertension in his mother; Stroke in his mother and another family member.    ROS:  Please see the history of present illness.    Patient denies fever, chills, headache, sweats, rash, change in vision, change in hearing, chest pain, cough, nausea or vomiting, urinary symptoms. All other systems are reviewed and are negative.   Physical Exam: Blood pressure 128/76, pulse (!) 57, height '5\' 6"'$  (1.676 m), weight 151 lb 12.8 oz (68.9 kg), SpO2 97 %.  GEN:  Well nourished, well developed in no acute distress HEENT: Normal NECK: No JVD; No carotid bruits LYMPHATICS: No lymphadenopathy CARDIAC: RRR , no murmurs, rubs, gallops RESPIRATORY:   Clear to auscultation without rales, wheezing or rhonchi  ABDOMEN: Soft, non-tender, non-distended MUSCULOSKELETAL:  No edema; No deformity  SKIN: Warm and dry NEUROLOGIC:  Alert and oriented x 3   EKG:   July 24 ,  2023 .   Sinus brady at 57.   No ST or T wave changes.    Recent Labs: 11/27/2021: ALT 9 01/02/2022: BUN 25; Creatinine, Ser 1.53; Potassium 3.8; Sodium 142    Lipid Panel    Component Value Date/Time   CHOL 107 11/27/2021 1133   TRIG 65 11/27/2021 1133   HDL 44 11/27/2021 1133   CHOLHDL 2.4 11/27/2021 1133   CHOLHDL 3 04/09/2017 1008   VLDL 14.8 04/09/2017 1008   LDLCALC 49 11/27/2021 1133      Wt Readings from Last 3 Encounters:  02/26/22 151 lb 12.8 oz (68.9 kg)  11/27/21 155 lb 3.7 oz (70.4 kg)  03/06/21 151 lb (68.5 kg)      Current medicines are reviewed  The patient understands his medications.   ASSESSMENT AND PLAN:  1. Coronary artery disease -      Skyy was found to have moderate coronary artery disease by coronary CT scan.  We started him on isosorbide.  He is not really done much in the way of exercise.  I encouraged him to get out and try to do his normal activities.  If he is not able to do a moderate amount of activity without having severe shortness of breath, then I think we should refer him for heart catheterization.  We have discussed the process of heart catheterization.  We discussed the risk, benefits, options of heart catheterization.  If we need to proceed with that he understands and agrees to proceed.  2. Carotid artery stenosis -     followed by [facility].  4. Essential hypertension-     blood pressures well controlled.  5. Hyperlipidemia -    lipid levels look good.  His LDL is at goal.  6. Abdominal aortic aneurysm repair -   7.  DOE :      [name], MD  02/26/2022 10:04 AM    [facility] [address]  [place], [place]  [address] Pager [phone] Phone: [phone]; Fax: [phone]

## 2022-03-02 ENCOUNTER — Other Ambulatory Visit: Payer: Self-pay | Admitting: Cardiovascular Disease

## 2022-03-05 ENCOUNTER — Telehealth: Payer: Self-pay | Admitting: Cardiovascular Disease

## 2022-03-05 DIAGNOSIS — Z01810 Encounter for preprocedural cardiovascular examination: Secondary | ICD-10-CM

## 2022-03-05 NOTE — Telephone Encounter (Signed)
Returned call to wife and spoke with husband (on speakerphone) who states that he went out to do some work in the yard today and was not able to complete it due to shortness of breath. Per last OV note on 02/26/22:  Coronary CT angiogram was performed on Dec 08, 2021. Coronary calcium score of 3852. This was 73 percentile for age and sex matched control. Right coronary artery was large and dominant.  He has severe diffuse calcified plaque.  FFR 0.7. Left main is a large artery and was normal. Left anterior descending artery is large with severe calcified plaque up to 60% stenosis. Left circumflex artery was nondominant.  There are several calcified plaques up to 60%. We discussed doing a heart catheterization but he stated that his pain was not all that severe and went and we elected to proceed with medical therapy. We started him on isosorbide 30 mg a day.  We sent in a prescription for nitroglycerin to be used for severe chest pain and pressure.  Routing to MD to make aware and clarify desired procedure is a L heart cath only.

## 2022-03-05 NOTE — Telephone Encounter (Signed)
Nahser, Wonda Cheng, MD  Donnalee Curry K Caller: Unspecified (Today, 11:49 AM) I saw him 1 week ago and he was having episodes of chest discomfort.  Coronary CT angiogram reveals a tight right coronary stenosis.  The isosorbide 30 mg tablet does not seem to be working as well as we would like.  At this point I would offer him a heart catheterization or perhaps a trial at isosorbide 60 mg a day.  If he is not able to do his normal activities despite good medical therapy on isosorbide then I would suggest heart catheterization.   We have already discussed the risk, benefits, options of heart catheterization.  He understands and agrees to proceed.   Called and spoke with wife who states pt wants to move forward with cath, rather than taking more medication. L heart Cath scheduled for Friday 03/09/22 '@12'$  w/[place]. He will arrive at 0700 for hydration. Pt will come in for labs on Wednesday 03/07/22. Cath instructions below, reviewed at length with wife.  [place] [address] Dept: [phone] Loc: [place]  03/05/2022  You are scheduled for a Cardiac Catheterization on Friday, August 4 with Dr. Lauree Chandler.  1. Please arrive at the Main Entrance A at [facility] [place], [place] [address] at 7:00 AM (This time is  hours before your procedure to ensure your preparation). Free valet parking service is available.   Special note: Every effort is made to have your procedure done on time. Please understand that emergencies sometimes delay scheduled procedures.  2. Diet: Do not eat solid foods after midnight.  You may have clear liquids until 5 AM upon the day of the procedure.  3. Labs: You will need to have blood drawn on Wednesday, August 2 at [facility]. [address]  Open: 7:30am - 5pm    Phone:  [phone]. You do not need to be fasting.  4. Medication instructions in preparation for your procedure:   Contrast Allergy: No  DO NOT TAKE Hydrochlorothiazide, Potassium, or Tamsulosin  On the morning of your procedure, take Aspirin '81mg'$  and any morning medicines NOT listed above.  You may use sips of water.  5. Plan to go home the same day, you will only stay overnight if medically necessary. 6. You MUST have a responsible adult to drive you home. 7. An adult MUST be with you the first 24 hours after you arrive home. 8. Bring a current list of your medications, and the last time and date medication taken. 9. Bring ID and current insurance cards. 10.Please wear clothes that are easy to get on and off and wear slip-on shoes.  Thank you for allowing [place] to care for you!   -- [facility]

## 2022-03-05 NOTE — Telephone Encounter (Signed)
Patient's wife called stating her husband would like to speak to nurse about scheduling surgery.

## 2022-03-06 ENCOUNTER — Other Ambulatory Visit: Payer: Self-pay | Admitting: Cardiovascular Disease

## 2022-03-06 DIAGNOSIS — I200 Unstable angina: Secondary | ICD-10-CM

## 2022-03-06 NOTE — Progress Notes (Signed)
Orders for cath   

## 2022-03-07 ENCOUNTER — Other Ambulatory Visit: Payer: Medicare Other

## 2022-03-07 ENCOUNTER — Telehealth: Payer: Self-pay | Admitting: *Deleted

## 2022-03-07 DIAGNOSIS — Z01810 Encounter for preprocedural cardiovascular examination: Secondary | ICD-10-CM | POA: Diagnosis not present

## 2022-03-07 LAB — BASIC METABOLIC PANEL
BUN/Creatinine Ratio: 18 (ref 10–24)
BUN: 25 mg/dL (ref 8–27)
CO2: 31 mmol/L — ABNORMAL HIGH (ref 20–29)
Calcium: 9.5 mg/dL (ref 8.6–10.2)
Chloride: 106 mmol/L (ref 96–106)
Creatinine, Ser: 1.39 mg/dL — ABNORMAL HIGH (ref 0.76–1.27)
Glucose: 100 mg/dL — ABNORMAL HIGH (ref 70–99)
Potassium: 3.9 mmol/L (ref 3.5–5.2)
Sodium: 141 mmol/L (ref 134–144)
eGFR: 51 mL/min/{1.73_m2} — ABNORMAL LOW (ref >59)

## 2022-03-07 LAB — CBC
Hematocrit: 45.3 % (ref 37.5–51.0)
Hemoglobin: 15.4 g/dL (ref 13.0–17.7)
MCH: 30.7 pg (ref 26.6–33.0)
MCHC: 34 g/dL (ref 31.5–35.7)
MCV: 90 fL (ref 79–97)
Platelets: 142 10*3/uL — ABNORMAL LOW (ref 150–450)
RBC: 5.01 x10E6/uL (ref 4.14–5.80)
RDW: 14 % (ref 11.6–15.4)
WBC: 4.9 10*3/uL (ref 3.4–10.8)

## 2022-03-07 NOTE — Telephone Encounter (Signed)
Cardiac Catheterization scheduled at [facility] for: Friday March 09, 2022 12 Noon Arrival time and place: [place] Entrance A at: 7 AM-pre-procedure hydration   Nothing to eat after midnight prior to procedure, clear liquids until 5 AM day of procedure.  Medication instructions: -Hold:  HCTZ/KCl-AM of procedure -Except hold medications usual morning medications can be taken with sips of water including aspirin 81 mg.  Confirmed patient has responsible adult to drive home post procedure and be with patient first 24 hours after arriving home.  Patient reports no new symptoms concerning for COVID-19 in the past 10 days.  Reviewed procedure instructions with patient's wife (DPR).

## 2022-03-09 ENCOUNTER — Encounter (HOSPITAL_COMMUNITY)
Admission: RE | Disposition: A | Discharge status: Home / Self Care | Payer: Self-pay | Source: Home / Self Care | Attending: Cardiovascular Disease

## 2022-03-09 ENCOUNTER — Ambulatory Visit (HOSPITAL_COMMUNITY)
Admission: RE | Admit: 2022-03-09 | Discharge: 2022-03-09 | Disposition: A | Discharge status: Home / Self Care | Payer: Medicare Other | Attending: Cardiovascular Disease | Admitting: Cardiovascular Disease

## 2022-03-09 ENCOUNTER — Other Ambulatory Visit: Payer: Self-pay

## 2022-03-09 DIAGNOSIS — R0609 Other forms of dyspnea: Secondary | ICD-10-CM | POA: Insufficient documentation

## 2022-03-09 DIAGNOSIS — I2584 Coronary atherosclerosis due to calcified coronary lesion: Secondary | ICD-10-CM | POA: Diagnosis not present

## 2022-03-09 DIAGNOSIS — E785 Hyperlipidemia, unspecified: Secondary | ICD-10-CM | POA: Insufficient documentation

## 2022-03-09 DIAGNOSIS — I6529 Occlusion and stenosis of unspecified carotid artery: Secondary | ICD-10-CM | POA: Insufficient documentation

## 2022-03-09 DIAGNOSIS — I25118 Atherosclerotic heart disease of native coronary artery with other forms of angina pectoris: Secondary | ICD-10-CM | POA: Insufficient documentation

## 2022-03-09 DIAGNOSIS — Z87891 Personal history of nicotine dependence: Secondary | ICD-10-CM | POA: Insufficient documentation

## 2022-03-09 DIAGNOSIS — N1831 Chronic kidney disease, stage 3a: Secondary | ICD-10-CM | POA: Insufficient documentation

## 2022-03-09 DIAGNOSIS — I129 Hypertensive chronic kidney disease with stage 1 through stage 4 chronic kidney disease, or unspecified chronic kidney disease: Secondary | ICD-10-CM | POA: Insufficient documentation

## 2022-03-09 DIAGNOSIS — I4891 Unspecified atrial fibrillation: Secondary | ICD-10-CM | POA: Diagnosis not present

## 2022-03-09 DIAGNOSIS — I200 Unstable angina: Secondary | ICD-10-CM

## 2022-03-09 HISTORY — PX: LEFT HEART CATH AND CORONARY ANGIOGRAPHY: CATH118249

## 2022-03-09 SURGERY — LEFT HEART CATH AND CORONARY ANGIOGRAPHY
Anesthesia: LOCAL

## 2022-03-09 MED ORDER — SODIUM CHLORIDE 0.9% FLUSH: 3.0000 mL | Freq: Two times a day (BID) | INTRAVENOUS | Status: DC

## 2022-03-09 MED ORDER — ASPIRIN 81 MG PO CHEW: 81.0000 mg | CHEWABLE_TABLET | ORAL | Status: DC

## 2022-03-09 MED ORDER — HEPARIN SODIUM (PORCINE) 1000 UNIT/ML IJ SOLN
INTRAMUSCULAR | Status: AC
  Filled 2022-03-09: qty 10

## 2022-03-09 MED ORDER — SODIUM CHLORIDE 0.9 % WEIGHT BASED INFUSION
1.0000 mL/kg/h | INTRAVENOUS | Status: DC
  Administered 2022-03-09: 1 mL/kg/h via INTRAVENOUS

## 2022-03-09 MED ORDER — SODIUM CHLORIDE 0.9 % IV SOLN: INTRAVENOUS | Status: AC

## 2022-03-09 MED ORDER — LIDOCAINE HCL (PF) 1 % IJ SOLN
INTRAMUSCULAR | Status: DC | PRN
  Administered 2022-03-09: 2 mL

## 2022-03-09 MED ORDER — HEPARIN (PORCINE) IN NACL 1000-0.9 UT/500ML-% IV SOLN
INTRAVENOUS | Status: AC
Start: 2022-03-09 — End: ?
  Filled 2022-03-09: qty 1000

## 2022-03-09 MED ORDER — SODIUM CHLORIDE 0.9 % WEIGHT BASED INFUSION
3.0000 mL/kg/h | INTRAVENOUS | Status: AC
  Administered 2022-03-09: 3 mL/kg/h via INTRAVENOUS

## 2022-03-09 MED ORDER — LABETALOL HCL 5 MG/ML IV SOLN: 10.0000 mg | INTRAVENOUS | Status: DC | PRN

## 2022-03-09 MED ORDER — HYDRALAZINE HCL 20 MG/ML IJ SOLN: 10.0000 mg | INTRAMUSCULAR | Status: DC | PRN

## 2022-03-09 MED ORDER — VERAPAMIL HCL 2.5 MG/ML IV SOLN
INTRAVENOUS | Status: DC | PRN
  Administered 2022-03-09: 10 mL via INTRA_ARTERIAL

## 2022-03-09 MED ORDER — LIDOCAINE HCL (PF) 1 % IJ SOLN
INTRAMUSCULAR | Status: AC
Start: 2022-03-09 — End: ?
  Filled 2022-03-09: qty 30

## 2022-03-09 MED ORDER — MIDAZOLAM HCL 2 MG/2ML IJ SOLN
INTRAMUSCULAR | Status: DC | PRN
  Administered 2022-03-09: 1 mg via INTRAVENOUS

## 2022-03-09 MED ORDER — ONDANSETRON HCL 4 MG/2ML IJ SOLN: 4.0000 mg | Freq: Four times a day (QID) | INTRAMUSCULAR | Status: DC | PRN

## 2022-03-09 MED ORDER — SODIUM CHLORIDE 0.9% FLUSH: 3.0000 mL | INTRAVENOUS | Status: DC | PRN

## 2022-03-09 MED ORDER — FENTANYL CITRATE (PF) 100 MCG/2ML IJ SOLN
INTRAMUSCULAR | Status: DC | PRN
  Administered 2022-03-09: 25 ug via INTRAVENOUS

## 2022-03-09 MED ORDER — FENTANYL CITRATE (PF) 100 MCG/2ML IJ SOLN
INTRAMUSCULAR | Status: AC
  Filled 2022-03-09: qty 2

## 2022-03-09 MED ORDER — MIDAZOLAM HCL 2 MG/2ML IJ SOLN
INTRAMUSCULAR | Status: AC
  Filled 2022-03-09: qty 2

## 2022-03-09 MED ORDER — VERAPAMIL HCL 2.5 MG/ML IV SOLN
INTRAVENOUS | Status: AC
  Filled 2022-03-09: qty 2

## 2022-03-09 MED ORDER — ACETAMINOPHEN 325 MG PO TABS: 650.0000 mg | ORAL_TABLET | ORAL | Status: DC | PRN

## 2022-03-09 MED ORDER — IOHEXOL 350 MG/ML SOLN
INTRAVENOUS | Status: DC | PRN
  Administered 2022-03-09: 60 mL

## 2022-03-09 MED ORDER — HEPARIN (PORCINE) IN NACL 1000-0.9 UT/500ML-% IV SOLN
INTRAVENOUS | Status: DC | PRN
  Administered 2022-03-09 (×3): 500 mL

## 2022-03-09 MED ORDER — SODIUM CHLORIDE 0.9 % IV SOLN: 250.0000 mL | INTRAVENOUS | Status: DC | PRN

## 2022-03-09 MED ORDER — HEPARIN SODIUM (PORCINE) 1000 UNIT/ML IJ SOLN
INTRAMUSCULAR | Status: DC | PRN
  Administered 2022-03-09: 3500 [IU] via INTRAVENOUS

## 2022-03-09 SURGICAL SUPPLY — 10 items
BAND ZEPHYR COMPRESS 30 LONG (HEMOSTASIS) ×1 IMPLANT
CATH 5FR JL3.5 JR4 ANG PIG MP (CATHETERS) ×1 IMPLANT
GLIDESHEATH SLEND SS 6F .021 (SHEATH) ×1 IMPLANT
GUIDEWIRE INQWIRE 1.5J.035X260 (WIRE) IMPLANT
INQWIRE 1.5J .035X260CM (WIRE) ×2
KIT HEART LEFT (KITS) ×2 IMPLANT
PACK CARDIAC CATHETERIZATION (CUSTOM PROCEDURE TRAY) ×2 IMPLANT
TRANSDUCER W/STOPCOCK (MISCELLANEOUS) ×2 IMPLANT
TUBING CIL FLEX 10 FLL-RA (TUBING) ×2 IMPLANT
WIRE HI TORQ VERSACORE-J 145CM (WIRE) ×1 IMPLANT

## 2022-03-09 NOTE — Interval H&P Note (Signed)
History and Physical Interval Note:  03/09/2022 10:08 AM  Timothy Eaton  has presented today for surgery, with the diagnosis of unstable angina.  The various methods of treatment have been discussed with the patient and family. After consideration of risks, benefits and other options for treatment, the patient has consented to  Procedure(s): LEFT HEART CATH AND CORONARY ANGIOGRAPHY (N/A) as a surgical intervention.  The patient's history has been reviewed, patient examined, no change in status, stable for surgery.  I have reviewed the patient's chart and labs.  Questions were answered to the patient's satisfaction.    Cath Lab Visit (complete for each Cath Lab visit)  Clinical Evaluation Leading to the Procedure:   ACS: No.  Non-ACS:    Anginal Classification: CCS III  Anti-ischemic medical therapy: Maximal Therapy (2 or more classes of medications)  Non-Invasive Test Results: No non-invasive testing performed  Prior CABG: Previous CABG        Lauree Chandler

## 2022-03-12 ENCOUNTER — Encounter (HOSPITAL_COMMUNITY): Payer: Self-pay | Admitting: Cardiovascular Disease

## 2022-05-01 DIAGNOSIS — Z23 Encounter for immunization: Secondary | ICD-10-CM | POA: Diagnosis not present

## 2022-05-24 ENCOUNTER — Other Ambulatory Visit: Payer: Self-pay | Admitting: Internal Medicine

## 2022-06-23 ENCOUNTER — Other Ambulatory Visit: Payer: Self-pay | Admitting: Internal Medicine

## 2022-06-25 ENCOUNTER — Other Ambulatory Visit: Payer: Self-pay | Admitting: Internal Medicine

## 2022-06-25 NOTE — Telephone Encounter (Signed)
Pt is very past due for an OV with Dr Silvio Pate. Medication filled for 30 days. Please schedule OV. Thanks.

## 2022-06-25 NOTE — Telephone Encounter (Signed)
LVM for patient to call and schedule an AWV or office visit.

## 2022-07-23 ENCOUNTER — Other Ambulatory Visit: Payer: Self-pay | Admitting: Internal Medicine

## 2022-07-24 ENCOUNTER — Other Ambulatory Visit: Payer: Self-pay | Admitting: Internal Medicine

## 2022-07-24 NOTE — Telephone Encounter (Signed)
Lvm for patient tcb and schedule 

## 2022-07-24 NOTE — Telephone Encounter (Signed)
Pt has not been seen in 2 years. Needs OV unless he is seeing a different PCP. Thanks

## 2022-08-22 ENCOUNTER — Other Ambulatory Visit: Payer: Self-pay | Admitting: Internal Medicine

## 2022-08-28 ENCOUNTER — Ambulatory Visit: Payer: Medicare Other | Attending: Physician Assistant | Admitting: Cardiology

## 2022-08-28 ENCOUNTER — Encounter: Payer: Self-pay | Admitting: Physician Assistant

## 2022-08-28 VITALS — BP 138/72 | HR 64 | Ht 69.0 in | Wt 150.2 lb

## 2022-08-28 DIAGNOSIS — E782 Mixed hyperlipidemia: Secondary | ICD-10-CM | POA: Diagnosis not present

## 2022-08-28 DIAGNOSIS — I6523 Occlusion and stenosis of bilateral carotid arteries: Secondary | ICD-10-CM | POA: Diagnosis not present

## 2022-08-28 DIAGNOSIS — I10 Essential (primary) hypertension: Secondary | ICD-10-CM

## 2022-08-28 DIAGNOSIS — I2510 Atherosclerotic heart disease of native coronary artery without angina pectoris: Secondary | ICD-10-CM

## 2022-08-28 NOTE — Progress Notes (Signed)
Cardiology Office Note:    Date:  08/28/2022   ID:  Timothy Eaton, DOB 10-11-1940, MRN [id number]  PCP:  [name], MD   [facility] Prov[facility] Cardiologist:  [name], MD     Referring MD: [name], MD   Chief Complaint  Patient presents with   Follow-up    Seen for Dr. Acie Fredrickson    History of Present Illness:    Timothy Eaton is a 82 y.o. male with a hx of coronary artery disease, carotid artery disease, hypertension, atrial fibrillation (postoperatively), COPD, pulmonary nodules, CKD stage IIIa, BPH, abdominal aortic aneurysm repair (2005).  Last seen in the office on 02/26/2022 by Dr. Acie Fredrickson, at this visit he had complained of a few episodes of chest pain with exertion, discussion was had surrounding heart catheterization but at that time the patient felt like the pain was not severe enough and wanted to proceed with medical therapy.  Isosorbide was started and nitro was sent in as needed.  About a week later, his wife called the office and the had mutually agreed that they wanted to proceed with a cardiac catheterization.   03/09/2022, left heart cath revealed mild to moderate diffuse disease in the mid and distal LAD, no flow-limiting lesions, decision was made to continue with isosorbide.   He presents today for follow-up of his coronary artery disease.  He states "I am feeling great", he remains active, walks most days of the week, and fishes when he can (weather permitting).  He has had no further incidents of chest pain or DOE.  He does inquire if he can stop some of his medications since he is feeling so well, we reviewed all of his medications and their current indications and he agreed that he would continue his current medication regimen. He denies chest pain, palpitations, dyspnea, pnd, orthopnea, n, v, dizziness, syncope, edema, weight gain, or early satiety.   Past Medical History:  Diagnosis Date   AAA (abdominal aortic aneurysm) ([place])     Dickson, Repaired, 2005   Atrial fibrillation ([place])    Postoperative in past   Carotid artery disease ([place])    Minimal, doppler 2004, 0-39% bilateral, doppler 05/17/09 0-39% bilateral   COPD (chronic obstructive pulmonary disease) ([place])    Coronary artery disease    Moderate, catheterization 2005, nuclear scan 9/09 no ischemia, EF 60%, echo   ED (erectile dysfunction)    Ejection fraction    EF 60% echo, September, 2009   Family history of adverse reaction to anesthesia    DAD-WOKE UP DURING SURGERY   GERD (gastroesophageal reflux disease)    Hemorrhoids    Hyperlipidemia    Hypertension    Lung nodule    Followup by pulmonary in past   Palpitations    History palpitations in the past   Palpitations    January, 2013   Shortness of breath     Past Surgical History:  Procedure Laterality Date   ABDOMINAL AORTIC ANEURYSM REPAIR  04/06/04   Dr. Rodney Langton SURGERY     CHOLECYSTECTOMY  11/26/02   CYSTOSCOPY W/ RETROGRADES Left 01/05/2020   Procedure: CYSTOSCOPY WITH RETROGRADE PYELOGRAM;  Surgeon: Abbie Sons, MD;  Location: [facility];  Service: Urology;  Laterality: Left;   CYSTOSCOPY WITH STENT PLACEMENT Left 12/12/2019   Procedure: CYSTOSCOPY WITH STENT PLACEMENT;  Surgeon: Lucas Mallow, MD;  Location: [facility];  Service: Urology;  Laterality: Left;   CYSTOSCOPY/URETEROSCOPY/HOLMIUM LASER/STENT PLACEMENT Left  01/05/2020   Procedure: CYSTOSCOPY/URETEROSCOPY/HOLMIUM LASER/STENT Exchange;  Surgeon: Abbie Sons, MD;  Location: [facility];  Service: Urology;  Laterality: Left;   LEFT HEART CATH AND CORONARY ANGIOGRAPHY N/A 03/09/2022   Procedure: LEFT HEART CATH AND CORONARY ANGIOGRAPHY;  Surgeon: Burnell Blanks, MD;  Location: [facility];  Service: Cardiovascular;  Laterality: N/A;   PILONIDAL CYST EXCISION  11/11    Current Medications: Current Meds  Medication Sig   aspirin 81 MG EC tablet Take 81 mg by mouth daily.     diltiazem (CARDIZEM CD) 240 MG  24 hr capsule TAKE 1 CAPSULE(240 MG) BY MOUTH DAILY   hydrochlorothiazide (HYDRODIURIL) 12.5 MG tablet Take 1 tablet (12.5 mg total) by mouth daily.   isosorbide mononitrate (IMDUR) 30 MG 24 hr tablet Take 1 tablet (30 mg total) by mouth daily.   metoprolol tartrate (LOPRESSOR) 25 MG tablet TAKE 1 TABLET(25 MG) BY MOUTH TWICE DAILY   nitroGLYCERIN (NITROSTAT) 0.4 MG SL tablet Place 1 tablet (0.4 mg total) under the tongue every 5 (five) minutes as needed (for chest pain or severe dyspnea).   potassium chloride (KLOR-CON M) 10 MEQ tablet Take 1 tablet (10 mEq total) by mouth daily.   rosuvastatin (CRESTOR) 20 MG tablet TAKE 1 TABLET(20 MG) BY MOUTH DAILY   tamsulosin (FLOMAX) 0.4 MG CAPS capsule TAKE 1 CAPSULE(0.4 MG) BY MOUTH DAILY     Allergies:   Sulfamethoxazole-trimethoprim   Social History   Socioeconomic History   Marital status: Married    Spouse name: Not on file   Number of children: 3   Years of education: Not on file   Highest education level: Not on file  Occupational History   Occupation: Truck driver    Comment: Retired  Tobacco Use   Smoking status: Former   Smokeless tobacco: Current    Types: Chew  Substance and Sexual Activity   Alcohol use: No   Drug use: No   Sexual activity: Not on file  Other Topics Concern   Not on file  Social History Narrative   No living will   Would want wife--then daughter Joelene Millin-- to make decisions for him.   Would accept resuscitation   No feeding tube if cognitively unaware   Social Determinants of Health   Financial Resource Strain: Not on file  Food Insecurity: Not on file  Transportation Needs: Not on file  Physical Activity: Not on file  Stress: Not on file  Social Connections: Not on file     Family History: The patient's family history includes Cancer in his sister; Diabetes in his mother; Heart attack in his father; Hypertension in his mother; Stroke in his mother and another family member.  ROS:   Please  see the history of present illness.     All other systems reviewed and are negative.  EKGs/Labs/Other Studies Reviewed:    The following studies were reviewed today:   03/09/22 LHC -   Prox RCA lesion is 30% stenosed.   Prox RCA to Mid RCA lesion is 40% stenosed.   Mid RCA lesion is 60% stenosed.   Dist RCA lesion is 50% stenosed.   RPDA lesion is 40% stenosed.   RPAV lesion is 40% stenosed.   Mid Cx lesion is 50% stenosed.   Prox LAD to Dist LAD lesion is 30% stenosed.   Dist LAD lesion is 40% stenosed.   Mild to moderate, diffuse disease in the mid and distal LAD. No flow limiting lesions Mild to moderate  mid Circumflex stenosis. No flow limiting lesions.  The RCA is a large dominant vessel The vessel is heavily calcified throughout. The proximal vessel has diffuse mild calcified stenosis. The mid vessel has an eccentric, heavily calcified moderate stenosis. This does not appear to be flow limiting.  Normal LV filling pressure.    Recommendations: Medical therapy for his diffuse calcific CAD. He feels better on Imdur.   12/18/21 echo complete - EF 60-65%, grade I DD, trivial MR.   12/05/21 CT coronary FFR -  1. Left Main: Normal   2. LAD: 0.95 proximal, 0.86 mid, 0.62 distal   3. LCX: Normal proximal   4. Ramus: N/a   5. RCA: 0.98 prox, 0.70 distal (at PDA)   IMPRESSION: Abnormal FFR. PDA lesion. Cumulative LAD lesions leading to distal flow abnormality.  04/24/21 Carotid US-  Right Carotid: Velocities in the right ICA are consistent with a 1-39%  stenosis.   Left Carotid: Velocities in the left ICA are consistent with a 1-39%  stenosis.      EKG:  EKG is not ordered today.    Recent Labs: 11/27/2021: ALT 9 03/07/2022: BUN 25; Creatinine, Ser 1.39; Hemoglobin 15.4; Platelets 142; Potassium 3.9; Sodium 141  Recent Lipid Panel    Component Value Date/Time   CHOL 107 11/27/2021 1133   TRIG 65 11/27/2021 1133   HDL 44 11/27/2021 1133   CHOLHDL 2.4 11/27/2021 1133    CHOLHDL 3 04/09/2017 1008   VLDL 14.8 04/09/2017 1008   LDLCALC 49 11/27/2021 1133     Risk Assessment/Calculations:                Physical Exam:    VS:  BP 138/72   Pulse 64   Ht '5\' 9"'$  (1.753 m)   Wt 150 lb 3.2 oz (68.1 kg)   SpO2 92%   BMI 22.18 kg/m     Wt Readings from Last 3 Encounters:  08/28/22 150 lb 3.2 oz (68.1 kg)  03/09/22 140 lb (63.5 kg)  02/26/22 151 lb 12.8 oz (68.9 kg)     GEN:  Well nourished, well developed in no acute distress HEENT: Normal NECK: No JVD; No carotid bruits LYMPHATICS: No lymphadenopathy CARDIAC: RRR, no murmurs, rubs, gallops RESPIRATORY:  Clear to auscultation without rales, wheezing or rhonchi  ABDOMEN: Soft, non-tender, non-distended MUSCULOSKELETAL:  No edema; No deformity  SKIN: Warm and dry NEUROLOGIC:  Alert and oriented x 3 PSYCHIATRIC:  Normal affect   ASSESSMENT:    1. Coronary artery disease involving native coronary artery of native heart without angina pectoris   2. Essential hypertension   3. Bilateral carotid artery stenosis   4. Mixed hyperlipidemia    PLAN:    In order of problems listed above:  Coronary artery disease - Left heart cath on 03/09/2022 showed diffuse calcified CAD, no flow limiting lesions. He feels remarkably better on Imdur. Walking and fishing, weather permitting. Denies chest pain or acute decompensation.  Continue GDMT metoprolol, aspirin, rosuvastatin.  Hypertension -blood pressure today 138/72, marginally controlled, however he has issues with his gait and would not want to adjust his medications at this time d/t risk for orthostasis.  Continue diltiazem, hydrochlorothiazide, and metoprolol.  Hyperlipidemia -LDL on 11/27/2021 was 44, currently well-managed, continue rosuvastatin, managed by his PCP.  Carotid artery stenosis -mild bilateral disease, he declines repeated carotid ultrasound at this time, continue rosuvastatin  Disposition -continue current medication regimen, return to  see Dr. Acie Fredrickson in 1 year.  Medication Adjustments/Labs and Tests Ordered: Current medicines are reviewed at length with the patient today.  Concerns regarding medicines are outlined above.  No orders of the defined types were placed in this encounter.  No orders of the defined types were placed in this encounter.   Patient Instructions  Medication Instructions:  Your physician recommends that you continue on your current medications as directed. Please refer to the Current Medication list given to you today.  *If you need a refill on your cardiac medications before your next appointment, please call your pharmacy*   Lab Work: None ordered  If you have labs (blood work) drawn today and your tests are completely normal, you will receive your results only by: [place] (if you have MyChart) OR A paper copy in the mail If you have any lab test that is abnormal or we need to change your treatment, we will call you to review the results.   Testing/Procedures: None ordered   Follow-Up: At [facility], you and your health needs are our priority.  As part of our continuing mission to provide you with exceptional heart care, we have created designated Provider Care Teams.  These Care Teams include your primary Cardiologist (physician) and Advanced Practice Providers (APPs -  Physician Assistants and Nurse Practitioners) who all work together to provide you with the care you need, when you need it.  We recommend signing up for the patient portal called "MyChart".  Sign up information is provided on this After Visit Summary.  MyChart is used to connect with patients for Virtual Visits (Telemedicine).  Patients are able to view lab/test results, encounter notes, upcoming appointments, etc.  Non-urgent messages can be sent to your provider as well.   To learn more about what you can do with MyChart, go to [email or web address].    Your next appointment:   12  month(s)  Provider:   [name], MD     Other Instructions    Signed, Trudi Ida, NP  08/28/2022 2:05 PM    [place]

## 2022-08-28 NOTE — Patient Instructions (Addendum)
Medication Instructions:  Your physician recommends that you continue on your current medications as directed. Please refer to the Current Medication list given to you today.  *If you need a refill on your cardiac medications before your next appointment, please call your pharmacy*   Lab Work: None ordered  If you have labs (blood work) drawn today and your tests are completely normal, you will receive your results only by: [place] (if you have MyChart) OR A paper copy in the mail If you have any lab test that is abnormal or we need to change your treatment, we will call you to review the results.   Testing/Procedures: None ordered   Follow-Up: At [facility], you and your health needs are our priority.  As part of our continuing mission to provide you with exceptional heart care, we have created designated Provider Care Teams.  These Care Teams include your primary Cardiologist (physician) and Advanced Practice Providers (APPs -  Physician Assistants and Nurse Practitioners) who all work together to provide you with the care you need, when you need it.  We recommend signing up for the patient portal called "MyChart".  Sign up information is provided on this After Visit Summary.  MyChart is used to connect with patients for Virtual Visits (Telemedicine).  Patients are able to view lab/test results, encounter notes, upcoming appointments, etc.  Non-urgent messages can be sent to your provider as well.   To learn more about what you can do with MyChart, go to [email or web address].    Your next appointment:   12 month(s)  Provider:   [name], MD     Other Instructions

## 2022-11-20 ENCOUNTER — Other Ambulatory Visit: Payer: Self-pay | Admitting: Cardiovascular Disease

## 2022-11-26 DIAGNOSIS — H6123 Impacted cerumen, bilateral: Secondary | ICD-10-CM | POA: Diagnosis not present

## 2022-11-26 DIAGNOSIS — H902 Conductive hearing loss, unspecified: Secondary | ICD-10-CM | POA: Diagnosis not present

## 2022-11-28 ENCOUNTER — Telehealth: Payer: Self-pay

## 2022-11-28 NOTE — Telephone Encounter (Signed)
Patient last seen 01/19/2020. Do you know if patient has moved to another office?

## 2022-11-29 NOTE — Telephone Encounter (Signed)
Called and spoke to pt's wife. She said he is not in a LTCF and is not seeing a different PCP. She will talk to him and see if he wants to make an appt with Dr Alphonsus Sias or change.

## 2022-11-30 ENCOUNTER — Other Ambulatory Visit: Payer: Self-pay

## 2022-11-30 MED ORDER — DILTIAZEM HCL ER COATED BEADS 240 MG PO CP24: ORAL_CAPSULE | ORAL | 2 refills | Status: DC

## 2022-11-30 MED ORDER — ISOSORBIDE MONONITRATE ER 30 MG PO TB24: 30.0000 mg | ORAL_TABLET | Freq: Every day | ORAL | 2 refills | Status: DC

## 2022-11-30 NOTE — Addendum Note (Signed)
Addended by: Margaret Pyle D on: 11/30/2022 09:13 AM   Modules accepted: Orders

## 2022-12-11 ENCOUNTER — Ambulatory Visit (INDEPENDENT_AMBULATORY_CARE_PROVIDER_SITE_OTHER): Payer: Medicare Other | Admitting: Internal Medicine

## 2022-12-11 ENCOUNTER — Encounter: Payer: Self-pay | Admitting: Internal Medicine

## 2022-12-11 VITALS — BP 138/72 | HR 64 | Temp 98.0°F | Ht 67.25 in | Wt 147.0 lb

## 2022-12-11 DIAGNOSIS — I480 Paroxysmal atrial fibrillation: Secondary | ICD-10-CM | POA: Diagnosis not present

## 2022-12-11 DIAGNOSIS — I25119 Atherosclerotic heart disease of native coronary artery with unspecified angina pectoris: Secondary | ICD-10-CM

## 2022-12-11 DIAGNOSIS — D696 Thrombocytopenia, unspecified: Secondary | ICD-10-CM

## 2022-12-11 DIAGNOSIS — I10 Essential (primary) hypertension: Secondary | ICD-10-CM

## 2022-12-11 DIAGNOSIS — Z0000 Encounter for general adult medical examination without abnormal findings: Secondary | ICD-10-CM

## 2022-12-11 DIAGNOSIS — I779 Disorder of arteries and arterioles, unspecified: Secondary | ICD-10-CM

## 2022-12-11 DIAGNOSIS — N1831 Chronic kidney disease, stage 3a: Secondary | ICD-10-CM

## 2022-12-11 DIAGNOSIS — R413 Other amnesia: Secondary | ICD-10-CM | POA: Diagnosis not present

## 2022-12-11 LAB — CBC
HCT: 44.8 % (ref 39.0–52.0)
Hemoglobin: 15.3 g/dL (ref 13.0–17.0)
MCHC: 34.2 g/dL (ref 30.0–36.0)
MCV: 92.9 fl (ref 78.0–100.0)
Platelets: 132 10*3/uL — ABNORMAL LOW (ref 150.0–400.0)
RBC: 4.82 Mil/uL (ref 4.22–5.81)
RDW: 13.9 % (ref 11.5–15.5)
WBC: 4 10*3/uL (ref 4.0–10.5)

## 2022-12-11 LAB — LIPID PANEL
Cholesterol: 95 mg/dL (ref 0–200)
HDL: 41.9 mg/dL (ref >39.00)
LDL Cholesterol: 39 mg/dL (ref 0–99)
NonHDL: 53.06
Total CHOL/HDL Ratio: 2
Triglycerides: 72 mg/dL (ref 0.0–149.0)
VLDL: 14.4 mg/dL (ref 0.0–40.0)

## 2022-12-11 LAB — HEPATIC FUNCTION PANEL
ALT: 11 U/L (ref 0–53)
AST: 14 U/L (ref 0–37)
Albumin: 3.9 g/dL (ref 3.5–5.2)
Alkaline Phosphatase: 68 U/L (ref 39–117)
Bilirubin, Direct: 0.2 mg/dL (ref 0.0–0.3)
Total Bilirubin: 0.7 mg/dL (ref 0.2–1.2)
Total Protein: 6.3 g/dL (ref 6.0–8.3)

## 2022-12-11 LAB — RENAL FUNCTION PANEL
Albumin: 3.9 g/dL (ref 3.5–5.2)
BUN: 21 mg/dL (ref 6–23)
CO2: 34 mEq/L — ABNORMAL HIGH (ref 19–32)
Calcium: 9.3 mg/dL (ref 8.4–10.5)
Chloride: 102 mEq/L (ref 96–112)
Creatinine, Ser: 1.51 mg/dL — ABNORMAL HIGH (ref 0.40–1.50)
GFR: 42.53 mL/min — ABNORMAL LOW (ref >60.00)
Glucose, Bld: 128 mg/dL — ABNORMAL HIGH (ref 70–99)
Phosphorus: 2.1 mg/dL — ABNORMAL LOW (ref 2.3–4.6)
Potassium: 3.3 mEq/L — ABNORMAL LOW (ref 3.5–5.1)
Sodium: 143 mEq/L (ref 135–145)

## 2022-12-11 LAB — TSH: TSH: 1.69 u[IU]/mL (ref 0.35–5.50)

## 2022-12-11 LAB — VITAMIN B12: Vitamin B-12: 122 pg/mL — ABNORMAL LOW (ref 211–911)

## 2022-12-11 NOTE — Assessment & Plan Note (Signed)
Has stable DOE/palpitations with activity Doing well on the isosorbide 30, diltiazem 240mg  daily, metoprolol 25 bid, rosuvastatin 20, ASA 81

## 2022-12-11 NOTE — Assessment & Plan Note (Signed)
On imaging On ASA, statin

## 2022-12-11 NOTE — Assessment & Plan Note (Signed)
Controlled with diltiazem, metoprolol, HCTZ 12.5mg 

## 2022-12-11 NOTE — Assessment & Plan Note (Signed)
After AAA repair---no clear recurrence

## 2022-12-11 NOTE — Progress Notes (Signed)
Subjective:    Patient ID: Timothy Eaton, male    DOB: March 27, 1941, 82 y.o.   MRN: [id number]  HPI Here for Medicare wellness visit and follow up of chronic health conditions Reviewed advanced directives Reviewed other doctors--Dr McAlhany/Nahser--cardiology, [facility] No hospitalizations. Had same day cardiac cath last summer Vision is fine---no eye exam Hearing got stopped up--cleaned at ENT and is better No tobacco for ~20 years No alcohol Stays active--yard work and some walking No falls No depression or anhedonia Independent with instrumental ADLs Does have some memory issues---no functional problems though  He feels great---just wishes he didn't take so many medications Had heart cath last summer--- non critical CAD Does get palpitations and SOB if he works hard Probably better on the isosorbide No real chest pain No dizziness or syncope No edema  Known carotid disease No neurologic symptoms  Voids okay No nocturia Flow is okay---seems to empty  No cough No wheezing  Last GFR 51   Current Outpatient Medications on File Prior to Visit  Medication Sig Dispense Refill   aspirin 81 MG EC tablet Take 81 mg by mouth daily.       diltiazem (CARDIZEM CD) 240 MG 24 hr capsule TAKE 1 CAPSULE(240 MG) BY MOUTH DAILY 90 capsule 2   hydrochlorothiazide (HYDRODIURIL) 12.5 MG tablet Take 1 tablet (12.5 mg total) by mouth daily. 90 tablet 3   isosorbide mononitrate (IMDUR) 30 MG 24 hr tablet Take 1 tablet (30 mg total) by mouth daily. 90 tablet 2   metoprolol tartrate (LOPRESSOR) 25 MG tablet TAKE 1 TABLET(25 MG) BY MOUTH TWICE DAILY 180 tablet 3   potassium chloride (KLOR-CON M) 10 MEQ tablet Take 1 tablet (10 mEq total) by mouth daily. 90 tablet 3   rosuvastatin (CRESTOR) 20 MG tablet TAKE 1 TABLET(20 MG) BY MOUTH DAILY 90 tablet 3   tamsulosin (FLOMAX) 0.4 MG CAPS capsule TAKE 1 CAPSULE(0.4 MG) BY MOUTH DAILY 30 capsule 0   nitroGLYCERIN (NITROSTAT) 0.4 MG SL tablet Place  1 tablet (0.4 mg total) under the tongue every 5 (five) minutes as needed (for chest pain or severe dyspnea). 75 tablet 3   No current facility-administered medications on file prior to visit.    Allergies  Allergen Reactions   Sulfamethoxazole-Trimethoprim Rash    Past Medical History:  Diagnosis Date   AAA (abdominal aortic aneurysm) ([facility])    Dickson, Repaired, 2005   Atrial fibrillation ([facility])    Postoperative in past   Carotid artery disease ([facility])    Minimal, doppler 2004, 0-39% bilateral, doppler 05/17/09 0-39% bilateral   COPD (chronic obstructive pulmonary disease) ([facility])    Coronary artery disease    Moderate, catheterization 2005, nuclear scan 9/09 no ischemia, EF 60%, echo   ED (erectile dysfunction)    Ejection fraction    EF 60% echo, September, 2009   Family history of adverse reaction to anesthesia    DAD-WOKE UP DURING SURGERY   GERD (gastroesophageal reflux disease)    Hemorrhoids    Hyperlipidemia    Hypertension    Lung nodule    Followup by pulmonary in past   Palpitations    History palpitations in the past   Palpitations    January, 2013   Shortness of breath     Past Surgical History:  Procedure Laterality Date   ABDOMINAL AORTIC ANEURYSM REPAIR  04/06/04   Dr. Mylo Red SURGERY     CHOLECYSTECTOMY  11/26/02   CYSTOSCOPY W/ RETROGRADES Left 01/05/2020  Procedure: CYSTOSCOPY WITH RETROGRADE PYELOGRAM;  Surgeon: Riki Altes, MD;  Location: [facility];  Service: Urology;  Laterality: Left;   CYSTOSCOPY WITH STENT PLACEMENT Left 12/12/2019   Procedure: CYSTOSCOPY WITH STENT PLACEMENT;  Surgeon: Crista Elliot, MD;  Location: [facility];  Service: Urology;  Laterality: Left;   CYSTOSCOPY/URETEROSCOPY/HOLMIUM LASER/STENT PLACEMENT Left 01/05/2020   Procedure: CYSTOSCOPY/URETEROSCOPY/HOLMIUM LASER/STENT Exchange;  Surgeon: Riki Altes, MD;  Location: [facility];  Service: Urology;  Laterality: Left;   LEFT HEART CATH AND CORONARY ANGIOGRAPHY N/A  03/09/2022   Procedure: LEFT HEART CATH AND CORONARY ANGIOGRAPHY;  Surgeon: Kathleene Hazel, MD;  Location: [facility];  Service: Cardiovascular;  Laterality: N/A;   PILONIDAL CYST EXCISION  11/11    Family History  Problem Relation Age of Onset   Hypertension Mother        ?   Stroke Mother        CVA   Diabetes Mother        DM   Heart attack Father        MI   Cancer Sister        Gall bladder    Stroke Other        All mom's family died of strokes    Social History   Socioeconomic History   Marital status: Married    Spouse name: Not on file   Number of children: 3   Years of education: Not on file   Highest education level: Not on file  Occupational History   Occupation: Truck driver    Comment: Retired  Tobacco Use   Smoking status: Former   Smokeless tobacco: Current    Types: Chew  Substance and Sexual Activity   Alcohol use: No   Drug use: No   Sexual activity: Not on file  Other Topics Concern   Not on file  Social History Narrative   No living will   Would want wife--then daughter Cala Bradford-- to make decisions for him.   Would accept resuscitation   No feeding tube if cognitively unaware   Social Determinants of Health   Financial Resource Strain: Not on file  Food Insecurity: Not on file  Transportation Needs: Not on file  Physical Activity: Not on file  Stress: Not on file  Social Connections: Not on file  Intimate Partner Violence: Not on file   Review of Systems Appetite is fine Weight is down a few pounds Sleeps okay Wears seat belt Full dentures--doesn't need dentist No heartburn or dysphagia Bowels are variable---doesn't need meds though. No blood No sig back or joint pain Has some skin issues--keratoses on right forehead and eyelid--recommended dermatologist    Objective:   Physical Exam Constitutional:      Appearance: Normal appearance.  HENT:     Mouth/Throat:     Pharynx: No oropharyngeal exudate or  posterior oropharyngeal erythema.  Eyes:     Conjunctiva/sclera: Conjunctivae normal.     Pupils: Pupils are equal, round, and reactive to light.  Cardiovascular:     Rate and Rhythm: Normal rate and regular rhythm.     Pulses: Normal pulses.     Heart sounds: No murmur heard.    No gallop.  Pulmonary:     Effort: Pulmonary effort is normal.     Breath sounds: No wheezing or rales.     Comments: Moderately decreased breath sounds but clear Abdominal:     Palpations: Abdomen is soft.     Tenderness: There  is no abdominal tenderness.  Musculoskeletal:     Cervical back: Neck supple.     Right lower leg: No edema.     Left lower leg: No edema.  Lymphadenopathy:     Cervical: No cervical adenopathy.  Skin:    Findings: No lesion or rash.  Neurological:     General: No focal deficit present.     Mental Status: He is alert and oriented to person, place, and time.     Comments: Word naming 2/1 minute---kept doing names Recall 0/3  Psychiatric:        Mood and Affect: Mood normal.        Behavior: Behavior normal.            Assessment & Plan:

## 2022-12-11 NOTE — Patient Instructions (Signed)
Please get a tetanus booster at the pharmacy. You can also.get shingrix vaccine also I recommend the flu and RSV vaccines in the fall

## 2022-12-11 NOTE — Assessment & Plan Note (Signed)
I have personally reviewed the Medicare Annual Wellness questionnaire and have noted 1. The patient's medical and social history 2. Their use of alcohol, tobacco or illicit drugs 3. Their current medications and supplements 4. The patient's functional ability including ADL's, fall risks, home safety risks and hearing or visual             impairment. 5. Diet and physical activities 6. Evidence for depression or mood disorders  The patients weight, height, BMI and visual acuity have been recorded in the chart I have made referrals, counseling and provided education to the patient based review of the above and I have provided the pt with a written personalized care plan for preventive services.  I have provided you with a copy of your personalized plan for preventive services. Please take the time to review along with your updated medication list.  Done with cancer screening Discussed getting back to walking Due for Td at phamacy---will consider shingrix Prefers no COVID vaccine Recommended flu/RSV in the fall

## 2022-12-11 NOTE — Progress Notes (Signed)
Vision Screening   Right eye Left eye Both eyes  Without correction 20/25 20/40 20/30   With correction     Hearing Screening - Comments:: April 2024 ENT. Said he did not need hearing adis.

## 2022-12-11 NOTE — Assessment & Plan Note (Signed)
Mild Will recheck labs 

## 2022-12-11 NOTE — Assessment & Plan Note (Signed)
Doesn't seem to be progressive Will check B12, thyroid

## 2022-12-12 ENCOUNTER — Other Ambulatory Visit: Payer: Self-pay | Admitting: Internal Medicine

## 2022-12-12 DIAGNOSIS — D696 Thrombocytopenia, unspecified: Secondary | ICD-10-CM | POA: Insufficient documentation

## 2022-12-12 DIAGNOSIS — E538 Deficiency of other specified B group vitamins: Secondary | ICD-10-CM

## 2022-12-12 NOTE — Assessment & Plan Note (Signed)
Some easy bruising but no worrisome bleeding Mildly low Will just recheck

## 2022-12-13 ENCOUNTER — Other Ambulatory Visit: Payer: Self-pay | Admitting: Internal Medicine

## 2022-12-17 ENCOUNTER — Other Ambulatory Visit: Payer: Self-pay

## 2022-12-17 MED ORDER — POTASSIUM CHLORIDE CRYS ER 20 MEQ PO TBCR: 20.0000 meq | EXTENDED_RELEASE_TABLET | Freq: Every day | ORAL | 1 refills | Status: DC

## 2023-01-17 ENCOUNTER — Other Ambulatory Visit (INDEPENDENT_AMBULATORY_CARE_PROVIDER_SITE_OTHER): Payer: Medicare Other

## 2023-01-17 DIAGNOSIS — E538 Deficiency of other specified B group vitamins: Secondary | ICD-10-CM

## 2023-01-17 LAB — RENAL FUNCTION PANEL
Albumin: 3.8 g/dL (ref 3.5–5.2)
BUN: 25 mg/dL — ABNORMAL HIGH (ref 6–23)
CO2: 33 mEq/L — ABNORMAL HIGH (ref 19–32)
Calcium: 8.9 mg/dL (ref 8.4–10.5)
Chloride: 104 mEq/L (ref 96–112)
Creatinine, Ser: 1.48 mg/dL (ref 0.40–1.50)
GFR: 43.54 mL/min — ABNORMAL LOW (ref >60.00)
Glucose, Bld: 153 mg/dL — ABNORMAL HIGH (ref 70–99)
Phosphorus: 2.6 mg/dL (ref 2.3–4.6)
Potassium: 3.4 mEq/L — ABNORMAL LOW (ref 3.5–5.1)
Sodium: 144 mEq/L (ref 135–145)

## 2023-01-17 LAB — VITAMIN B12: Vitamin B-12: 365 pg/mL (ref 211–911)

## 2023-01-22 ENCOUNTER — Other Ambulatory Visit: Payer: Self-pay | Admitting: Internal Medicine

## 2023-04-10 DIAGNOSIS — Z23 Encounter for immunization: Secondary | ICD-10-CM | POA: Diagnosis not present

## 2023-05-27 DIAGNOSIS — H902 Conductive hearing loss, unspecified: Secondary | ICD-10-CM | POA: Diagnosis not present

## 2023-05-27 DIAGNOSIS — H6123 Impacted cerumen, bilateral: Secondary | ICD-10-CM | POA: Diagnosis not present

## 2023-06-22 ENCOUNTER — Emergency Department
Admission: EM | Admit: 2023-06-22 | Discharge: 2023-06-22 | Discharge status: Against Medical Advice | Payer: Medicare Other | Attending: Emergency Medicine | Admitting: Emergency Medicine

## 2023-06-22 ENCOUNTER — Other Ambulatory Visit: Payer: Self-pay

## 2023-06-22 DIAGNOSIS — Z5321 Procedure and treatment not carried out due to patient leaving prior to being seen by health care provider: Secondary | ICD-10-CM | POA: Insufficient documentation

## 2023-06-22 DIAGNOSIS — R55 Syncope and collapse: Secondary | ICD-10-CM | POA: Diagnosis not present

## 2023-06-22 DIAGNOSIS — R000 Tachycardia, unspecified: Secondary | ICD-10-CM | POA: Diagnosis not present

## 2023-06-22 DIAGNOSIS — R1110 Vomiting, unspecified: Secondary | ICD-10-CM | POA: Diagnosis not present

## 2023-06-22 DIAGNOSIS — F0390 Unspecified dementia without behavioral disturbance: Secondary | ICD-10-CM | POA: Insufficient documentation

## 2023-06-22 DIAGNOSIS — R1111 Vomiting without nausea: Secondary | ICD-10-CM | POA: Diagnosis not present

## 2023-06-22 DIAGNOSIS — R531 Weakness: Secondary | ICD-10-CM | POA: Diagnosis not present

## 2023-06-22 LAB — CBC
HCT: 48.2 % (ref 39.0–52.0)
Hemoglobin: 15.6 g/dL (ref 13.0–17.0)
MCH: 31 pg (ref 26.0–34.0)
MCHC: 32.4 g/dL (ref 30.0–36.0)
MCV: 95.6 fL (ref 80.0–100.0)
Platelets: 135 10*3/uL — ABNORMAL LOW (ref 150–400)
RBC: 5.04 MIL/uL (ref 4.22–5.81)
RDW: 13.2 % (ref 11.5–15.5)
WBC: 4.7 10*3/uL (ref 4.0–10.5)
nRBC: 0 % (ref 0.0–0.2)

## 2023-06-22 LAB — BASIC METABOLIC PANEL
Anion gap: 8 (ref 5–15)
BUN: 30 mg/dL — ABNORMAL HIGH (ref 8–23)
CO2: 28 mmol/L (ref 22–32)
Calcium: 9.1 mg/dL (ref 8.9–10.3)
Chloride: 105 mmol/L (ref 98–111)
Creatinine, Ser: 1.41 mg/dL — ABNORMAL HIGH (ref 0.61–1.24)
GFR, Estimated: 49 mL/min — ABNORMAL LOW (ref >60)
Glucose, Bld: 161 mg/dL — ABNORMAL HIGH (ref 70–99)
Potassium: 4 mmol/L (ref 3.5–5.1)
Sodium: 141 mmol/L (ref 135–145)

## 2023-06-22 LAB — TROPONIN I (HIGH SENSITIVITY): Troponin I (High Sensitivity): 8 ng/L (ref <18)

## 2023-06-22 NOTE — ED Triage Notes (Signed)
Pt was at kitchen sink and passed out- wife helped pt to chair and pt threw up. Pt denies CP, denies dizziness at this time. Pt denies numbness, upper and lower extremities strong bilaterally. Pt states he's lost weight and strength over the last 6 months. Pt is AOX4, NAD noted.

## 2023-06-22 NOTE — ED Triage Notes (Signed)
First nurse note: pt to ED [facility] From home for emesis, generalized weakness started this am while washing dishes. Hx dementia.

## 2023-06-24 ENCOUNTER — Telehealth: Payer: Self-pay

## 2023-06-24 NOTE — Telephone Encounter (Signed)
Left message to call office

## 2023-06-24 NOTE — Telephone Encounter (Signed)
Karie Schwalbe, MD  Eual Fines, CMA Please check on him Looks like he left ER before being seen       Previous Messages    ----- Message ----- From: Discharge Provider, Automatic Sent: 06/22/2023   9:24 PM EST To: Karie Schwalbe, MD

## 2023-08-19 DIAGNOSIS — D225 Melanocytic nevi of trunk: Secondary | ICD-10-CM | POA: Diagnosis not present

## 2023-08-19 DIAGNOSIS — L859 Epidermal thickening, unspecified: Secondary | ICD-10-CM | POA: Diagnosis not present

## 2023-08-19 DIAGNOSIS — B079 Viral wart, unspecified: Secondary | ICD-10-CM | POA: Diagnosis not present

## 2023-08-19 DIAGNOSIS — D2261 Melanocytic nevi of right upper limb, including shoulder: Secondary | ICD-10-CM | POA: Diagnosis not present

## 2023-08-19 DIAGNOSIS — D235 Other benign neoplasm of skin of trunk: Secondary | ICD-10-CM | POA: Diagnosis not present

## 2023-08-19 DIAGNOSIS — L538 Other specified erythematous conditions: Secondary | ICD-10-CM | POA: Diagnosis not present

## 2023-08-19 DIAGNOSIS — L570 Actinic keratosis: Secondary | ICD-10-CM | POA: Diagnosis not present

## 2023-08-19 DIAGNOSIS — D0462 Carcinoma in situ of skin of left upper limb, including shoulder: Secondary | ICD-10-CM | POA: Diagnosis not present

## 2023-08-19 DIAGNOSIS — L821 Other seborrheic keratosis: Secondary | ICD-10-CM | POA: Diagnosis not present

## 2023-08-19 DIAGNOSIS — D485 Neoplasm of uncertain behavior of skin: Secondary | ICD-10-CM | POA: Diagnosis not present

## 2023-08-19 DIAGNOSIS — L820 Inflamed seborrheic keratosis: Secondary | ICD-10-CM | POA: Diagnosis not present

## 2023-08-19 DIAGNOSIS — D2262 Melanocytic nevi of left upper limb, including shoulder: Secondary | ICD-10-CM | POA: Diagnosis not present

## 2023-08-22 ENCOUNTER — Other Ambulatory Visit: Payer: Self-pay | Admitting: Cardiology

## 2023-08-26 ENCOUNTER — Other Ambulatory Visit: Payer: Self-pay

## 2023-08-26 MED ORDER — DILTIAZEM HCL ER COATED BEADS 240 MG PO CP24: ORAL_CAPSULE | ORAL | 0 refills | Status: DC

## 2023-08-27 ENCOUNTER — Other Ambulatory Visit: Payer: Self-pay | Admitting: *Deleted

## 2023-08-27 ENCOUNTER — Other Ambulatory Visit: Payer: Self-pay | Admitting: Cardiology

## 2023-08-27 MED ORDER — ISOSORBIDE MONONITRATE ER 30 MG PO TB24: 30.0000 mg | ORAL_TABLET | Freq: Every day | ORAL | 0 refills | Status: DC

## 2023-08-27 NOTE — Telephone Encounter (Signed)
Rx refill sent to pharmacy. 

## 2023-09-02 DIAGNOSIS — D0462 Carcinoma in situ of skin of left upper limb, including shoulder: Secondary | ICD-10-CM | POA: Diagnosis not present

## 2023-09-06 ENCOUNTER — Other Ambulatory Visit: Payer: Self-pay | Admitting: Cardiovascular Disease

## 2023-09-17 DIAGNOSIS — L239 Allergic contact dermatitis, unspecified cause: Secondary | ICD-10-CM | POA: Diagnosis not present

## 2023-09-21 ENCOUNTER — Other Ambulatory Visit: Payer: Self-pay | Admitting: Cardiovascular Disease

## 2023-09-21 ENCOUNTER — Other Ambulatory Visit: Payer: Self-pay | Admitting: Cardiology

## 2023-09-23 ENCOUNTER — Other Ambulatory Visit: Payer: Self-pay

## 2023-09-23 MED ORDER — DILTIAZEM HCL ER COATED BEADS 240 MG PO CP24: ORAL_CAPSULE | ORAL | 0 refills | Status: DC

## 2023-10-03 ENCOUNTER — Other Ambulatory Visit: Payer: Self-pay | Admitting: Internal Medicine

## 2023-10-03 NOTE — Telephone Encounter (Signed)
 Copied from CRM [id number]. Topic: Clinical - Medication Refill >> Oct 03, 2023 10:38 AM Corin V wrote: Most Recent Primary Care Visit:  Provider: [facility]  Department: [facility]  Visit Type: LAB  Date: 01/17/2023  Medication: isosorbide mononitrate (IMDUR) 30 MG 24 hr tablet  Has the patient contacted their pharmacy? Yes (Agent: If no, request that the patient contact the pharmacy for the refill. If patient does not wish to contact the pharmacy document the reason why and proceed with request.) (Agent: If yes, when and what did the pharmacy advise?)  Is this the correct pharmacy for this prescription? Yes If no, delete pharmacy and type the correct one.  This is the patient's preferred pharmacy:  [id number] - [place], [place] - [address] AT [facility] OF [address] [place] [address] Phone: [phone] Fax: [phone]   Has the prescription been filled recently? No  Is the patient out of the medication? No  Has the patient been seen for an appointment in the last year OR does the patient have an upcoming appointment? Yes  Can we respond through MyChart? No  Agent: Please be advised that Rx refills may take up to 3 business days. We ask that you follow-up with your pharmacy.

## 2023-10-07 ENCOUNTER — Ambulatory Visit: Payer: Medicare Other | Admitting: Internal Medicine

## 2023-10-07 NOTE — Telephone Encounter (Signed)
*  STAT* If patient is at the pharmacy, call can be transferred to refill team.   1. Which medications need to be refilled? (please list name of each medication and dose if known) Isosorbide   2. Would you like to learn more about the convenience, safety, & potential cost savings by using the [facility]?    3. Are you open to using the [facility] (Type [facility]. .   4. Which pharmacy/location (including street and city if local pharmacy) is medication to be sent to?    5. Do they need a 30 day or 90 day supply? enough until his appointment on 10-11-23- please call today- out of medicine

## 2023-10-10 ENCOUNTER — Encounter: Payer: Self-pay | Admitting: Cardiovascular Disease

## 2023-10-10 NOTE — Progress Notes (Signed)
 Cardiology Office Note   Date:  12/08/2023   ID:  Raz, Vandiest 27-Jul-1941, MRN [id number]  PCP:  Helaine Llanos, MD  Cardiologist: former Ardell Koller , now  Ahmad Alert, MD   Chief Complaint  Patient presents with   Coronary Artery Disease        Hyperlipidemia   Problem list 1. Coronary artery disease - mild CAD -   ~ 2005  2. Carotid artery stenosis - mild 3. Atrial fibrillation-postoperative 4. Essential hypertension 5. Hyperlipidemia 6. Abdominal aortic aneurysm repair   Timothy Eaton is a 83 y.o. male who presents today to follow-up coronary artery disease. He is feeling well. I saw him last May, 2015. His carotids are followed carefully. He had a nuclear scan in 2014 showing no definite ischemia. There was question of some mild ischemia, but it may been diaphragmatic attenuation. He has not had any significant symptoms.  Dec 26, 2015:  Complains of some right neck pain - especially in the afternoons.  Has not been active.    Retired from truck driving   Dec 14, 2016:  Timothy Eaton is seen today for follow up of his CAD  Has carotid disease - followed at [facility].  No CP or dyspnea  Some dyspnea.  January 20, 2018:  Timothy Eaton is seen today for follow-up of his coronary artery disease and carotid disease.  He is followed at [facility] for his carotid disease. Doing well.  Not as much exercise.   Used to walk regularly . No CP , no dyspnea  BP is mildly elevated today    February 17, 2019 : Doing well.  Very active , likes to fish.    No CP or dyspnea.  Some DOE if he does something too strenuous .    Aug. 3, 2021:   Hx of carotid disease and AAA repair. No cp, Some dyspnea with exercise .  Walks on occasion without any issues.  Mows the yard without any issues.   Aug. 1, 2022: No CP or dyspnea Occasionally feels like a bug is crawling on the back of the right side of his back neck   November 27, 2021: Timothy Eaton is seen today for a follow-up visit. Is having more DOE .  Not  associated with CP or pressure  Will get an echo and coronary CT angiogram for further eval of this DOE   February 26, 2022: Timothy Eaton was seen several months ago with episodes of chest pain and pressure/dyspnea with exertion (  for example while mowing his yard )  Coronary CT angiogram was performed on Dec 08, 2021.  Coronary calcium  score of 3852. This was 35 percentile for age and sex matched control.  Right coronary artery was large and dominant.  He has severe diffuse calcified plaque.  FFR 0.7. Left main is a large artery and was normal. Left anterior descending artery is large with severe calcified plaque up to 60% stenosis. Left circumflex artery was nondominant.  There are several calcified plaques up to 60%.  We discussed doing a heart catheterization but he stated that his pain was not all that severe and went and we elected to proceed with medical therapy.  We started him on isosorbide  30 mg a day.  We sent in a prescription for nitroglycerin  to be used for severe chest pain and pressure.  He is feeling failry well.   October 11, 2023  Timothy Eaton is seen for follow up of his CAD   AAA repair Cath  03/09/22 showed mild - moderate CAD but no critical stenosis  He was seen by Pattricia Bores, NP in Jan. 2024  Imdur  was added to his medications   Has some DOE with exertion   Still active , mows his yard int he spring , summer .   He has atrial fibrillation on his diagnosis but it looks like he had postoperative atrial fibrillation possibly after his aneurysm surgery.  He has not had any since.  He is not on an anticoagulant.  At this point I am not sure that he needs to be started on an anticoagulant given the lack of any evidence of atrial fibrillation for the past 20 years.    Past Medical History:  Diagnosis Date   AAA (abdominal aortic aneurysm) (HCC)    Timothy Eaton, Repaired, 2005   Atrial fibrillation (HCC)    Postoperative in past   Carotid artery disease (HCC)    Minimal,  doppler 2004, 0-39% bilateral, doppler 05/17/09 0-39% bilateral   COPD (chronic obstructive pulmonary disease) (HCC)    Coronary artery disease    Moderate, catheterization 2005, nuclear scan 9/09 no ischemia, EF 60%, echo   ED (erectile dysfunction)    Ejection fraction    EF 60% echo, September, 2009   Family history of adverse reaction to anesthesia    DAD-WOKE UP DURING SURGERY   GERD (gastroesophageal reflux disease)    Hemorrhoids    Hyperlipidemia    Hypertension    Lung nodule    Followup by pulmonary in past   Palpitations    History palpitations in the past   Palpitations    January, 2013   Shortness of breath     Past Surgical History:  Procedure Laterality Date   ABDOMINAL AORTIC ANEURYSM REPAIR  04/06/04   Dr. Alease Amend SURGERY     CHOLECYSTECTOMY  11/26/02   CYSTOSCOPY W/ RETROGRADES Left 01/05/2020   Procedure: CYSTOSCOPY WITH RETROGRADE PYELOGRAM;  Surgeon: Geraline Knapp, MD;  Location: [facility];  Service: Urology;  Laterality: Left;   CYSTOSCOPY WITH STENT PLACEMENT Left 12/12/2019   Procedure: CYSTOSCOPY WITH STENT PLACEMENT;  Surgeon: Samson Croak, MD;  Location: [facility];  Service: Urology;  Laterality: Left;   CYSTOSCOPY/URETEROSCOPY/HOLMIUM LASER/STENT PLACEMENT Left 01/05/2020   Procedure: CYSTOSCOPY/URETEROSCOPY/HOLMIUM LASER/STENT Exchange;  Surgeon: Geraline Knapp, MD;  Location: [facility];  Service: Urology;  Laterality: Left;   LEFT HEART CATH AND CORONARY ANGIOGRAPHY N/A 03/09/2022   Procedure: LEFT HEART CATH AND CORONARY ANGIOGRAPHY;  Surgeon: Odie Benne, MD;  Location: [facility];  Service: Cardiovascular;  Laterality: N/A;   PILONIDAL CYST EXCISION  11/11    Patient Active Problem List   Diagnosis Date Noted   Decreased appetite 10/16/2023   Urinary frequency 10/16/2023   Other fatigue 10/16/2023   Elevated glucose 10/16/2023   Thrombocytopenia (HCC) 12/12/2022   CKD (chronic kidney disease), stage IIIa 01/09/2020    Nephrolithiasis 12/19/2019   Memory loss 12/09/2019   BPH with obstruction/lower urinary tract symptoms 07/07/2019   Preventative health care 04/09/2017   Advance directive discussed with patient 04/09/2017   Atherosclerotic heart disease of native coronary artery with angina pectoris [facility])    Carotid artery disease (HCC)    Dyspnea on exertion    Atrial fibrillation (HCC)    Pulmonary nodules 04/02/2011   COPD (chronic obstructive pulmonary disease) with emphysema (HCC) 04/02/2009   HEMORRHOIDS, HX OF 04/02/2009   Hyperlipidemia 06/25/2008   Essential hypertension 06/25/2008  Current Outpatient Medications  Medication Sig Dispense Refill   aspirin  81 MG EC tablet Take 81 mg by mouth daily.       mometasone (ELOCON) 0.1 % ointment Apply topically 2 (two) times daily.     tamsulosin  (FLOMAX ) 0.4 MG CAPS capsule TAKE 1 CAPSULE(0.4 MG) BY MOUTH DAILY 90 capsule 3   diltiazem  (CARDIZEM  CD) 240 MG 24 hr capsule TAKE 1 CAPSULE(240 MG) BY MOUTH DAILY 15 capsule 0   hydrochlorothiazide  (HYDRODIURIL ) 12.5 MG tablet Take 1 tablet (12.5 mg total) by mouth daily. 90 tablet 3   isosorbide  mononitrate (IMDUR ) 30 MG 24 hr tablet Take 1 tablet (30 mg total) by mouth daily. 90 tablet 0   metoprolol  tartrate (LOPRESSOR ) 25 MG tablet Take 1 tablet (25 mg total) by mouth 2 (two) times daily. 180 tablet 3   nitroGLYCERIN  (NITROSTAT ) 0.4 MG SL tablet Place 1 tablet (0.4 mg total) under the tongue every 5 (five) minutes as needed (for chest pain or severe dyspnea). 75 tablet 3   potassium chloride  SA (KLOR-CON  M) 20 MEQ tablet TAKE 1 TABLET(20 MEQ) BY MOUTH DAILY 90 tablet 3   rosuvastatin  (CRESTOR ) 20 MG tablet Take 1 tablet (20 mg total) by mouth daily. 90 tablet 3   No current facility-administered medications for this visit.    Allergies:   Sulfamethoxazole-trimethoprim    Social History:  The patient  reports that he has quit smoking. His smokeless tobacco use includes chew. He reports that  he does not drink alcohol and does not use drugs.   Family History:  The patient's family history includes Cancer in his sister; Diabetes in his mother; Heart attack in his father; Hypertension in his mother; Stroke in his mother and another family member.    ROS:  Please see the history of present illness.    Patient denies fever, chills, headache, sweats, rash, change in vision, change in hearing, chest pain, cough, nausea or vomiting, urinary symptoms. All other systems are reviewed and are negative.    Physical Exam: Blood pressure 136/70, pulse 62, height 5\' 9"  (1.753 m), weight 142 lb 12.8 oz (64.8 kg), SpO2 92%.    GEN:  Well nourished, well developed in no acute distress HEENT: Normal NECK: No JVD; No carotid bruits LYMPHATICS: No lymphadenopathy CARDIAC: RRR , no murmurs, rubs, gallops RESPIRATORY:  Clear to auscultation without rales, wheezing or rhonchi  ABDOMEN: Soft, non-tender, non-distended MUSCULOSKELETAL:  No edema; No deformity  SKIN: Warm and dry NEUROLOGIC:  Alert and oriented x 3    EKG:            Recent Labs: 10/16/2023: ALT 9; BUN 31; Creatinine, Ser 1.53; Hemoglobin 16.5; Platelets 153.0; Potassium 3.1; Sodium 144; TSH 3.95    Lipid Panel    Component Value Date/Time   CHOL 119 10/11/2023 1315   TRIG 87 10/11/2023 1315   HDL 52 10/11/2023 1315   CHOLHDL 2.3 10/11/2023 1315   CHOLHDL 2 12/11/2022 1224   VLDL 14.4 12/11/2022 1224   LDLCALC 50 10/11/2023 1315      Wt Readings from Last 3 Encounters:  10/16/23 139 lb (63 kg)  10/11/23 142 lb 12.8 oz (64.8 kg)  06/22/23 135 lb (61.2 kg)      Current medicines are reviewed  The patient understands his medications.   ASSESSMENT AND PLAN:  1. Coronary artery disease -    had a heart catheterization in August, 2023.  He was found to have mild to moderate nonobstructive stenosis.  He is  never had any coronary stenting.      2. Carotid artery stenosis -     repeat carotid duplex scan .   4.  Essential hypertension-      BP is well controlled.   5. Hyperlipidemia -    check lipids today   6. Abdominal aortic aneurysm repair -   stable   7.  DOE :      Ahmad Alert, MD  12/08/2023 7:37 AM    [facility] [address] [place],  [address] [place], [place]  [address] Pager [phone] Phone: [phone]; Fax: [phone]

## 2023-10-11 ENCOUNTER — Encounter: Payer: Self-pay | Admitting: Cardiovascular Disease

## 2023-10-11 ENCOUNTER — Ambulatory Visit: Attending: Cardiovascular Disease | Admitting: Cardiovascular Disease

## 2023-10-11 ENCOUNTER — Other Ambulatory Visit: Payer: Self-pay

## 2023-10-11 VITALS — BP 136/70 | HR 62 | Ht 69.0 in | Wt 142.8 lb

## 2023-10-11 DIAGNOSIS — I6523 Occlusion and stenosis of bilateral carotid arteries: Secondary | ICD-10-CM | POA: Diagnosis not present

## 2023-10-11 DIAGNOSIS — E782 Mixed hyperlipidemia: Secondary | ICD-10-CM | POA: Diagnosis not present

## 2023-10-11 DIAGNOSIS — I2510 Atherosclerotic heart disease of native coronary artery without angina pectoris: Secondary | ICD-10-CM

## 2023-10-11 DIAGNOSIS — I10 Essential (primary) hypertension: Secondary | ICD-10-CM | POA: Diagnosis not present

## 2023-10-11 MED ORDER — METOPROLOL TARTRATE 25 MG PO TABS: 25.0000 mg | ORAL_TABLET | Freq: Two times a day (BID) | ORAL | 3 refills | Status: AC

## 2023-10-11 MED ORDER — DILTIAZEM HCL ER COATED BEADS 240 MG PO CP24: ORAL_CAPSULE | ORAL | 0 refills | Status: DC

## 2023-10-11 MED ORDER — ISOSORBIDE MONONITRATE ER 30 MG PO TB24: 30.0000 mg | ORAL_TABLET | Freq: Every day | ORAL | 0 refills | Status: DC

## 2023-10-11 MED ORDER — HYDROCHLOROTHIAZIDE 12.5 MG PO TABS: 12.5000 mg | ORAL_TABLET | Freq: Every day | ORAL | 3 refills | Status: AC

## 2023-10-11 MED ORDER — HYDROCHLOROTHIAZIDE 12.5 MG PO TABS: 12.5000 mg | ORAL_TABLET | Freq: Every day | ORAL | 0 refills | Status: DC

## 2023-10-11 MED ORDER — NITROGLYCERIN 0.4 MG SL SUBL: 0.4000 mg | SUBLINGUAL_TABLET | SUBLINGUAL | 3 refills | Status: AC | PRN

## 2023-10-11 MED ORDER — ROSUVASTATIN CALCIUM 20 MG PO TABS: 20.0000 mg | ORAL_TABLET | Freq: Every day | ORAL | 3 refills | Status: AC

## 2023-10-11 NOTE — Patient Instructions (Signed)
 Lab Work: Lipids, ALT, BMET today If you have labs (blood work) drawn today and your tests are completely normal, you will receive your results only by: MyChart Message (if you have MyChart) OR A paper copy in the mail If you have any lab test that is abnormal or we need to change your treatment, we will call you to review the results.   Testing/Procedures: Carotid Ultrasound Your physician has requested that you have a carotid duplex. This test is an ultrasound of the carotid arteries in your neck. It looks at blood flow through these arteries that supply the brain with blood. Allow one hour for this exam. There are no restrictions or special instructions.  Follow-Up: At [facility], you and your health needs are our priority.  As part of our continuing mission to provide you with exceptional heart care, we have created designated Provider Care Teams.  These Care Teams include your primary Cardiologist (physician) and Advanced Practice Providers (APPs -  Physician Assistants and Nurse Practitioners) who all work together to provide you with the care you need, when you need it.  We recommend signing up for the patient portal called "MyChart".  Sign up information is provided on this After Visit Summary.  MyChart is used to connect with patients for Virtual Visits (Telemedicine).  Patients are able to view lab/test results, encounter notes, upcoming appointments, etc.  Non-urgent messages can be sent to your provider as well.   To learn more about what you can do with MyChart, go to [email or web address].    Your next appointment:   1 year(s)  Provider:   Kristeen Miss, MD      1st Floor: - Lobby - Registration  - Pharmacy  - Lab - Cafe  2nd Floor: - PV Lab - Diagnostic Testing (echo, CT, nuclear med)  3rd Floor: - Vacant  4th Floor: - TCTS (cardiothoracic surgery) - AFib Clinic - Structural Heart Clinic - Vascular Surgery  - Vascular Ultrasound  5th Floor: -  HeartCare Cardiology (general and EP) - Clinical Pharmacy for coumadin, hypertension, lipid, weight-loss medications, and med management appointments    Valet parking services will be available as well.

## 2023-10-12 ENCOUNTER — Other Ambulatory Visit: Payer: Self-pay | Admitting: Internal Medicine

## 2023-10-12 LAB — LIPID PANEL
Chol/HDL Ratio: 2.3 ratio (ref 0.0–5.0)
Cholesterol, Total: 119 mg/dL (ref 100–199)
HDL: 52 mg/dL (ref >39)
LDL Chol Calc (NIH): 50 mg/dL (ref 0–99)
Triglycerides: 87 mg/dL (ref 0–149)
VLDL Cholesterol Cal: 17 mg/dL (ref 5–40)

## 2023-10-12 LAB — BASIC METABOLIC PANEL
BUN/Creatinine Ratio: 15 (ref 10–24)
BUN: 24 mg/dL (ref 8–27)
CO2: 24 mmol/L (ref 20–29)
Calcium: 9.1 mg/dL (ref 8.6–10.2)
Chloride: 104 mmol/L (ref 96–106)
Creatinine, Ser: 1.59 mg/dL — ABNORMAL HIGH (ref 0.76–1.27)
Glucose: 100 mg/dL — ABNORMAL HIGH (ref 70–99)
Potassium: 4.1 mmol/L (ref 3.5–5.2)
Sodium: 143 mmol/L (ref 134–144)
eGFR: 43 mL/min/{1.73_m2} — ABNORMAL LOW (ref >59)

## 2023-10-12 LAB — ALT: ALT: 12 IU/L (ref 0–44)

## 2023-10-15 ENCOUNTER — Telehealth: Payer: Self-pay

## 2023-10-15 NOTE — Telephone Encounter (Signed)
 Copied from CRM [id number]. Topic: General - Other >> Oct 14, 2023  4:14 PM Truddie Crumble wrote: Reason for CRM: patient wife would like to be called regarding the patient getting sick on Saturday night. Patient was diagnosed with stage three kidney diease in 2021. Patient kidney function levels were high when he went to cardiologist on Friday and then Saturday he was throwing up and had foamy urine.  Patient had a negative covid and fluid test. Patient need a referral to the kidney doctor because the patient is very sick

## 2023-10-15 NOTE — Telephone Encounter (Signed)
 Spoke to pt's wife per DPR. 10-12-23 he was vomiting for several hours. He is not a diabetic, but they checked his blood sugar and it was elevated. He had an episode of syncope. Refused to go to the ER then and still refusing to go now. She states his urine is dark orange color. I offered an appt here today, but it was at 1130 and she said she could not get him here today at the time we had. I made him an OV with Dr Ermalene Searing tomorrow with the understanding that he go somewhere today if he has anymore vomiting or syncopal episodes.  She stated he has poor eating habits. Concerned he could be diabetic.   Cardiology did labs on him 10-11-23 and his creatinine was 1.59  She stated she was not aware until cardiology mentioned it that he had CKD 3. She is concerned could that have anything to do with his symptoms.   He went to the ER in November 2024 with similar symptoms and his BUN and creatinine was elevated. He left without being seen.  The referral to urology in 2021 was for a kidney stones. He had similar symptoms then. She called urology and they said he had to have a new referral since it has been over 3 years since he has been seen.   Sending to Dr Ermalene Searing and Dr Alphonsus Sias as Lorain Childes.

## 2023-10-15 NOTE — Telephone Encounter (Signed)
 Called and spoke to pt's wife with Dr Karle Starch note. She said they can decide about the specialist after the visit tomorrow.

## 2023-10-16 ENCOUNTER — Encounter: Payer: Self-pay | Admitting: Family Medicine

## 2023-10-16 ENCOUNTER — Ambulatory Visit (INDEPENDENT_AMBULATORY_CARE_PROVIDER_SITE_OTHER): Admitting: Family Medicine

## 2023-10-16 VITALS — BP 130/76 | HR 91 | Temp 97.2°F | Ht 69.0 in | Wt 139.0 lb

## 2023-10-16 DIAGNOSIS — R7309 Other abnormal glucose: Secondary | ICD-10-CM | POA: Insufficient documentation

## 2023-10-16 DIAGNOSIS — I4891 Unspecified atrial fibrillation: Secondary | ICD-10-CM | POA: Diagnosis not present

## 2023-10-16 DIAGNOSIS — R630 Anorexia: Secondary | ICD-10-CM | POA: Insufficient documentation

## 2023-10-16 DIAGNOSIS — R5383 Other fatigue: Secondary | ICD-10-CM | POA: Insufficient documentation

## 2023-10-16 DIAGNOSIS — R350 Frequency of micturition: Secondary | ICD-10-CM | POA: Diagnosis not present

## 2023-10-16 LAB — POC URINALSYSI DIPSTICK (AUTOMATED)
Bilirubin, UA: POSITIVE
Blood, UA: NEGATIVE
Glucose, UA: NEGATIVE
Ketones, UA: NEGATIVE
Leukocytes, UA: NEGATIVE
Nitrite, UA: NEGATIVE
Protein, UA: POSITIVE — AB
Spec Grav, UA: 1.02 (ref 1.010–1.025)
Urobilinogen, UA: 1 U/dL
pH, UA: 5.5 (ref 5.0–8.0)

## 2023-10-16 LAB — CBC WITH DIFFERENTIAL/PLATELET
Basophils Absolute: 0 10*3/uL (ref 0.0–0.1)
Basophils Relative: 0.2 % (ref 0.0–3.0)
Eosinophils Absolute: 0.1 10*3/uL (ref 0.0–0.7)
Eosinophils Relative: 1.2 % (ref 0.0–5.0)
HCT: 49.1 % (ref 39.0–52.0)
Hemoglobin: 16.5 g/dL (ref 13.0–17.0)
Lymphocytes Relative: 18.4 % (ref 12.0–46.0)
Lymphs Abs: 0.9 10*3/uL (ref 0.7–4.0)
MCHC: 33.6 g/dL (ref 30.0–36.0)
MCV: 93.3 fl (ref 78.0–100.0)
Monocytes Absolute: 0.6 10*3/uL (ref 0.1–1.0)
Monocytes Relative: 12.2 % — ABNORMAL HIGH (ref 3.0–12.0)
Neutro Abs: 3.3 10*3/uL (ref 1.4–7.7)
Neutrophils Relative %: 68 % (ref 43.0–77.0)
Platelets: 153 10*3/uL (ref 150.0–400.0)
RBC: 5.27 Mil/uL (ref 4.22–5.81)
RDW: 14.6 % (ref 11.5–15.5)
WBC: 4.9 10*3/uL (ref 4.0–10.5)

## 2023-10-16 LAB — COMPREHENSIVE METABOLIC PANEL
ALT: 9 U/L (ref 0–53)
AST: 17 U/L (ref 0–37)
Albumin: 3.9 g/dL (ref 3.5–5.2)
Alkaline Phosphatase: 57 U/L (ref 39–117)
BUN: 31 mg/dL — ABNORMAL HIGH (ref 6–23)
CO2: 29 meq/L (ref 19–32)
Calcium: 8.9 mg/dL (ref 8.4–10.5)
Chloride: 103 meq/L (ref 96–112)
Creatinine, Ser: 1.53 mg/dL — ABNORMAL HIGH (ref 0.40–1.50)
GFR: 41.62 mL/min — ABNORMAL LOW (ref >60.00)
Glucose, Bld: 91 mg/dL (ref 70–99)
Potassium: 3.1 meq/L — ABNORMAL LOW (ref 3.5–5.1)
Sodium: 144 meq/L (ref 135–145)
Total Bilirubin: 0.9 mg/dL (ref 0.2–1.2)
Total Protein: 6 g/dL (ref 6.0–8.3)

## 2023-10-16 LAB — VITAMIN B12: Vitamin B-12: 148 pg/mL — ABNORMAL LOW (ref 211–911)

## 2023-10-16 LAB — HEMOGLOBIN A1C: Hgb A1c MFr Bld: 6.2 % (ref 4.6–6.5)

## 2023-10-16 LAB — TSH: TSH: 3.95 u[IU]/mL (ref 0.35–5.50)

## 2023-10-16 NOTE — Assessment & Plan Note (Signed)
 Chronic, discussed this diagnosis in detail with patient and daughter.  Encouraged him to restart his medication especially Cardizem and Lopressor as his heart rate is significantly elevated in the office today between 90 and 120.

## 2023-10-16 NOTE — Assessment & Plan Note (Signed)
 Acute, patient daughter has noted some elevated blood sugars here lately.  She states he eats poorly and when he does eat eats a lot of sweets. We will evaluate with an A1c and glucose today.

## 2023-10-16 NOTE — Assessment & Plan Note (Signed)
 Chronic, possible recent worsening secondary to stopping Flomax. Urinalysis shows no evidence of kidney stone or urinary tract infection.  Also no glucose .  There was some bilirubin noted

## 2023-10-16 NOTE — Progress Notes (Signed)
 Patient ID: Timothy Eaton, male    DOB: Jun 19, 1941, 83 y.o.   MRN: [id number]  This visit was conducted in person.  BP 130/76   Pulse 91   Temp (!) 97.2 F (36.2 C) (Oral)   Ht 5\' 9"  (1.753 m)   Wt 139 lb (63 kg)   SpO2 96%   BMI 20.53 kg/m    CC:  Chief Complaint  Patient presents with   Emesis    Vomitted Saturday for 4 hours, Has not vomitted since Sat. Has lost appetite, stopped taking medications   Urinary Frequency    Patients daughter note patient has had to urinate a lot and urine is dark     Subjective:   HPI: Timothy Eaton is a 83 y.o. male  patient of Dr.letvak with history of  Afib, CKD, kidney stones, thrombocytopenia presenting on 10/16/2023 for Emesis (Vomitted Saturday for 4 hours, Has not vomitted since Sat. Has lost appetite, stopped taking medications) and Urinary Frequency (Patients daughter note patient has had to urinate a lot and urine is dark )  Patient presents with his daughter today.  They both have many questions about his chronic health including diagnosis of chronic kidney disease which patient states he has never been aware of. He also appears to have significant amount of chronic issues over the last 6 months.   4 days ago emesis for 4 hours none since. He feels he may have eaten to much.. cake and spahetti.   One episode of diarrhea.  No fever.  Next day FBS 121  Did and COVID flu test.. negative. Decreased appetite, refusing medications.  Drinking ginger ale. Repeated COVID/flu test negative.  Since then he has not taken any of his medication including his medications to control atrial fibrillation.  Has noted increase in frequency of urine, darker urine... for a while.   Losing weight over 6 months  Decreased motivation, sitting around a lot, gets tired easily. Denies depression. Reviewed recent cardiology office visit from March 7. Wt Readings from Last 3 Encounters:  10/16/23 139 lb (63 kg)  10/11/23 142 lb 12.8 oz (64.8 kg)   06/22/23 135 lb (61.2 kg)     Relevant past medical, surgical, family and social history reviewed and updated as indicated. Interim medical history since our last visit reviewed. Allergies and medications reviewed and updated. Outpatient Medications Prior to Visit  Medication Sig Dispense Refill   aspirin 81 MG EC tablet Take 81 mg by mouth daily.       diltiazem (CARDIZEM CD) 240 MG 24 hr capsule TAKE 1 CAPSULE(240 MG) BY MOUTH DAILY 15 capsule 0   hydrochlorothiazide (HYDRODIURIL) 12.5 MG tablet Take 1 tablet (12.5 mg total) by mouth daily. 90 tablet 3   isosorbide mononitrate (IMDUR) 30 MG 24 hr tablet Take 1 tablet (30 mg total) by mouth daily. 90 tablet 0   metoprolol tartrate (LOPRESSOR) 25 MG tablet Take 1 tablet (25 mg total) by mouth 2 (two) times daily. 180 tablet 3   mometasone (ELOCON) 0.1 % ointment Apply topically 2 (two) times daily.     nitroGLYCERIN (NITROSTAT) 0.4 MG SL tablet Place 1 tablet (0.4 mg total) under the tongue every 5 (five) minutes as needed (for chest pain or severe dyspnea). 75 tablet 3   potassium chloride SA (KLOR-CON M) 20 MEQ tablet TAKE 1 TABLET(20 MEQ) BY MOUTH DAILY 90 tablet 3   rosuvastatin (CRESTOR) 20 MG tablet Take 1 tablet (20 mg total) by mouth daily. 90  tablet 3   tamsulosin (FLOMAX) 0.4 MG CAPS capsule TAKE 1 CAPSULE(0.4 MG) BY MOUTH DAILY 90 capsule 3   No facility-administered medications prior to visit.     Per HPI unless specifically indicated in ROS section below Review of Systems  Constitutional:  Positive for appetite change, fatigue and unexpected weight change.  Gastrointestinal:  Positive for diarrhea and vomiting. Negative for abdominal pain, blood in stool, nausea and rectal pain.   Objective:  BP 130/76   Pulse 91   Temp (!) 97.2 F (36.2 C) (Oral)   Ht 5\' 9"  (1.753 m)   Wt 139 lb (63 kg)   SpO2 96%   BMI 20.53 kg/m   Wt Readings from Last 3 Encounters:  10/16/23 139 lb (63 kg)  10/11/23 142 lb 12.8 oz (64.8 kg)   06/22/23 135 lb (61.2 kg)      Physical Exam Vitals reviewed.  Constitutional:      Appearance: He is well-developed.  HENT:     Head: Normocephalic.     Right Ear: Hearing normal.     Left Ear: Hearing normal.     Nose: Nose normal.  Neck:     Thyroid: No thyroid mass or thyromegaly.     Vascular: No carotid bruit.     Trachea: Trachea normal.  Cardiovascular:     Rate and Rhythm: Regular rhythm. Tachycardia present.     Pulses: Normal pulses.     Heart sounds: Heart sounds not distant. No murmur heard.    No friction rub. No gallop.     Comments: No peripheral edema   Pulmonary:     Effort: Pulmonary effort is normal. No respiratory distress.     Breath sounds: Normal breath sounds.  Skin:    General: Skin is warm and dry.     Findings: No rash.  Neurological:     Mental Status: He is alert.  Psychiatric:        Speech: Speech normal.        Behavior: Behavior normal.        Thought Content: Thought content normal.       Results for orders placed or performed in visit on 10/16/23  POCT Urinalysis Dipstick (Automated)   Collection Time: 10/16/23 12:31 PM  Result Value Ref Range   Color, UA Brown    Clarity, UA clear    Glucose, UA Negative Negative   Bilirubin, UA pos    Ketones, UA neg    Spec Grav, UA 1.020 1.010 - 1.025   Blood, UA neg    pH, UA 5.5 5.0 - 8.0   Protein, UA Positive (A) Negative   Urobilinogen, UA 1.0 0.2 or 1.0 E.U./dL   Nitrite, UA neg    Leukocytes, UA Negative Negative    Assessment and Plan  Urinary frequency Assessment & Plan: Chronic, possible recent worsening secondary to stopping Flomax. Urinalysis shows no evidence of kidney stone or urinary tract infection.  Also no glucose .  There was some bilirubin noted  Orders: -     POCT Urinalysis Dipstick (Automated)  Other fatigue -     CBC with Differential/Platelet -     Comprehensive metabolic panel -     TSH -     Vitamin B12  Elevated glucose Assessment &  Plan: Acute, patient daughter has noted some elevated blood sugars here lately.  She states he eats poorly and when he does eat eats a lot of sweets. We will evaluate with an A1c and  glucose today.  Orders: -     Hemoglobin A1c  Decreased appetite Assessment & Plan: Chronic, unclear cause Will evaluate with labs.  Will defer back to PCP who knows him better.  Possible mild depression.  Consider addition of Remeron to stimulate appetite.  Tried to encourage patient to eat more and add meal supplements like Ensure.   Atrial fibrillation, unspecified type [facility]) Assessment & Plan: Chronic, discussed this diagnosis in detail with patient and daughter.  Encouraged him to restart his medication especially Cardizem and Lopressor as his heart rate is significantly elevated in the office today between 90 and 120.     Return in about 2 weeks (around 10/30/2023) for  follow up with PCP.   Kerby Nora, MD

## 2023-10-16 NOTE — Assessment & Plan Note (Signed)
 Chronic, unclear cause Will evaluate with labs.  Will defer back to PCP who knows him better.  Possible mild depression.  Consider addition of Remeron to stimulate appetite.  Tried to encourage patient to eat more and add meal supplements like Ensure.

## 2023-10-17 ENCOUNTER — Encounter: Payer: Self-pay | Admitting: *Deleted

## 2023-11-05 ENCOUNTER — Ambulatory Visit (HOSPITAL_COMMUNITY)

## 2023-12-11 ENCOUNTER — Ambulatory Visit (INDEPENDENT_AMBULATORY_CARE_PROVIDER_SITE_OTHER): Admitting: Internal Medicine

## 2023-12-11 ENCOUNTER — Emergency Department

## 2023-12-11 ENCOUNTER — Encounter: Payer: Self-pay | Admitting: Internal Medicine

## 2023-12-11 ENCOUNTER — Other Ambulatory Visit: Payer: Self-pay

## 2023-12-11 ENCOUNTER — Inpatient Hospital Stay
Admission: EM | Admit: 2023-12-11 | Discharge: 2023-12-16 | DRG: 189 | Disposition: A | Discharge status: Home Health | Attending: Hospitalist | Admitting: Hospitalist

## 2023-12-11 VITALS — BP 110/74 | HR 73 | Ht 69.0 in | Wt 134.0 lb

## 2023-12-11 DIAGNOSIS — Z7982 Long term (current) use of aspirin: Secondary | ICD-10-CM | POA: Diagnosis not present

## 2023-12-11 DIAGNOSIS — I6529 Occlusion and stenosis of unspecified carotid artery: Secondary | ICD-10-CM | POA: Diagnosis not present

## 2023-12-11 DIAGNOSIS — Z681 Body mass index (BMI) 19 or less, adult: Secondary | ICD-10-CM

## 2023-12-11 DIAGNOSIS — Z833 Family history of diabetes mellitus: Secondary | ICD-10-CM | POA: Diagnosis not present

## 2023-12-11 DIAGNOSIS — N183 Chronic kidney disease, stage 3 unspecified: Secondary | ICD-10-CM | POA: Diagnosis present

## 2023-12-11 DIAGNOSIS — I480 Paroxysmal atrial fibrillation: Secondary | ICD-10-CM | POA: Diagnosis present

## 2023-12-11 DIAGNOSIS — R079 Chest pain, unspecified: Secondary | ICD-10-CM | POA: Diagnosis not present

## 2023-12-11 DIAGNOSIS — E876 Hypokalemia: Secondary | ICD-10-CM | POA: Diagnosis not present

## 2023-12-11 DIAGNOSIS — Z8679 Personal history of other diseases of the circulatory system: Secondary | ICD-10-CM

## 2023-12-11 DIAGNOSIS — R42 Dizziness and giddiness: Secondary | ICD-10-CM | POA: Diagnosis not present

## 2023-12-11 DIAGNOSIS — N400 Enlarged prostate without lower urinary tract symptoms: Secondary | ICD-10-CM | POA: Diagnosis not present

## 2023-12-11 DIAGNOSIS — R54 Age-related physical debility: Secondary | ICD-10-CM | POA: Diagnosis present

## 2023-12-11 DIAGNOSIS — I2510 Atherosclerotic heart disease of native coronary artery without angina pectoris: Secondary | ICD-10-CM | POA: Diagnosis not present

## 2023-12-11 DIAGNOSIS — T380X5A Adverse effect of glucocorticoids and synthetic analogues, initial encounter: Secondary | ICD-10-CM | POA: Diagnosis not present

## 2023-12-11 DIAGNOSIS — Z882 Allergy status to sulfonamides status: Secondary | ICD-10-CM | POA: Diagnosis not present

## 2023-12-11 DIAGNOSIS — R911 Solitary pulmonary nodule: Secondary | ICD-10-CM | POA: Diagnosis present

## 2023-12-11 DIAGNOSIS — I129 Hypertensive chronic kidney disease with stage 1 through stage 4 chronic kidney disease, or unspecified chronic kidney disease: Secondary | ICD-10-CM | POA: Diagnosis not present

## 2023-12-11 DIAGNOSIS — R7303 Prediabetes: Secondary | ICD-10-CM | POA: Diagnosis present

## 2023-12-11 DIAGNOSIS — H9190 Unspecified hearing loss, unspecified ear: Secondary | ICD-10-CM | POA: Diagnosis present

## 2023-12-11 DIAGNOSIS — I779 Disorder of arteries and arterioles, unspecified: Secondary | ICD-10-CM | POA: Diagnosis present

## 2023-12-11 DIAGNOSIS — R2681 Unsteadiness on feet: Secondary | ICD-10-CM | POA: Diagnosis not present

## 2023-12-11 DIAGNOSIS — N1831 Chronic kidney disease, stage 3a: Secondary | ICD-10-CM | POA: Diagnosis present

## 2023-12-11 DIAGNOSIS — J9601 Acute respiratory failure with hypoxia: Secondary | ICD-10-CM | POA: Diagnosis not present

## 2023-12-11 DIAGNOSIS — F17220 Nicotine dependence, chewing tobacco, uncomplicated: Secondary | ICD-10-CM | POA: Diagnosis present

## 2023-12-11 DIAGNOSIS — N281 Cyst of kidney, acquired: Secondary | ICD-10-CM | POA: Diagnosis not present

## 2023-12-11 DIAGNOSIS — I959 Hypotension, unspecified: Secondary | ICD-10-CM | POA: Diagnosis not present

## 2023-12-11 DIAGNOSIS — Z9049 Acquired absence of other specified parts of digestive tract: Secondary | ICD-10-CM

## 2023-12-11 DIAGNOSIS — R0609 Other forms of dyspnea: Secondary | ICD-10-CM | POA: Diagnosis not present

## 2023-12-11 DIAGNOSIS — I493 Ventricular premature depolarization: Secondary | ICD-10-CM | POA: Diagnosis present

## 2023-12-11 DIAGNOSIS — J441 Chronic obstructive pulmonary disease with (acute) exacerbation: Principal | ICD-10-CM | POA: Diagnosis present

## 2023-12-11 DIAGNOSIS — J449 Chronic obstructive pulmonary disease, unspecified: Secondary | ICD-10-CM | POA: Diagnosis not present

## 2023-12-11 DIAGNOSIS — R634 Abnormal weight loss: Secondary | ICD-10-CM | POA: Diagnosis not present

## 2023-12-11 DIAGNOSIS — R0902 Hypoxemia: Secondary | ICD-10-CM

## 2023-12-11 DIAGNOSIS — Z79899 Other long term (current) drug therapy: Secondary | ICD-10-CM

## 2023-12-11 DIAGNOSIS — J9611 Chronic respiratory failure with hypoxia: Secondary | ICD-10-CM | POA: Diagnosis present

## 2023-12-11 DIAGNOSIS — R739 Hyperglycemia, unspecified: Secondary | ICD-10-CM | POA: Diagnosis present

## 2023-12-11 DIAGNOSIS — Z8249 Family history of ischemic heart disease and other diseases of the circulatory system: Secondary | ICD-10-CM

## 2023-12-11 DIAGNOSIS — R0689 Other abnormalities of breathing: Secondary | ICD-10-CM | POA: Diagnosis not present

## 2023-12-11 DIAGNOSIS — R918 Other nonspecific abnormal finding of lung field: Secondary | ICD-10-CM | POA: Diagnosis not present

## 2023-12-11 DIAGNOSIS — I10 Essential (primary) hypertension: Secondary | ICD-10-CM | POA: Diagnosis present

## 2023-12-11 DIAGNOSIS — Z823 Family history of stroke: Secondary | ICD-10-CM

## 2023-12-11 DIAGNOSIS — R0602 Shortness of breath: Secondary | ICD-10-CM | POA: Diagnosis not present

## 2023-12-11 LAB — BRAIN NATRIURETIC PEPTIDE: B Natriuretic Peptide: 128.4 pg/mL — ABNORMAL HIGH (ref 0.0–100.0)

## 2023-12-11 LAB — BASIC METABOLIC PANEL WITH GFR
Anion gap: 9 (ref 5–15)
BUN: 27 mg/dL — ABNORMAL HIGH (ref 8–23)
CO2: 30 mmol/L (ref 22–32)
Calcium: 8.6 mg/dL — ABNORMAL LOW (ref 8.9–10.3)
Chloride: 103 mmol/L (ref 98–111)
Creatinine, Ser: 1.4 mg/dL — ABNORMAL HIGH (ref 0.61–1.24)
GFR, Estimated: 50 mL/min — ABNORMAL LOW (ref >60)
Glucose, Bld: 106 mg/dL — ABNORMAL HIGH (ref 70–99)
Potassium: 3.1 mmol/L — ABNORMAL LOW (ref 3.5–5.1)
Sodium: 142 mmol/L (ref 135–145)

## 2023-12-11 LAB — CBC
HCT: 45.5 % (ref 39.0–52.0)
Hemoglobin: 14.7 g/dL (ref 13.0–17.0)
MCH: 30.6 pg (ref 26.0–34.0)
MCHC: 32.3 g/dL (ref 30.0–36.0)
MCV: 94.8 fL (ref 80.0–100.0)
Platelets: 146 10*3/uL — ABNORMAL LOW (ref 150–400)
RBC: 4.8 MIL/uL (ref 4.22–5.81)
RDW: 14.6 % (ref 11.5–15.5)
WBC: 4.3 10*3/uL (ref 4.0–10.5)
nRBC: 0 % (ref 0.0–0.2)

## 2023-12-11 LAB — TROPONIN I (HIGH SENSITIVITY)
Troponin I (High Sensitivity): 15 ng/L (ref <18)
Troponin I (High Sensitivity): 15 ng/L

## 2023-12-11 MED ORDER — IPRATROPIUM-ALBUTEROL 0.5-2.5 (3) MG/3ML IN SOLN
3.0000 mL | Freq: Once | RESPIRATORY_TRACT | Status: AC
  Administered 2023-12-11: 3 mL via RESPIRATORY_TRACT
  Filled 2023-12-11: qty 3

## 2023-12-11 MED ORDER — IPRATROPIUM-ALBUTEROL 0.5-2.5 (3) MG/3ML IN SOLN
6.0000 mL | Freq: Once | RESPIRATORY_TRACT | Status: AC
  Administered 2023-12-11: 6 mL via RESPIRATORY_TRACT
  Filled 2023-12-11: qty 6

## 2023-12-11 MED ORDER — ASPIRIN 81 MG PO TBEC
81.0000 mg | DELAYED_RELEASE_TABLET | Freq: Every day | ORAL | Status: DC
  Administered 2023-12-11 – 2023-12-16 (×6): 81 mg via ORAL
  Filled 2023-12-11 (×6): qty 1

## 2023-12-11 MED ORDER — ISOSORBIDE MONONITRATE ER 30 MG PO TB24
30.0000 mg | ORAL_TABLET | Freq: Every day | ORAL | Status: DC
  Administered 2023-12-11 – 2023-12-12 (×2): 30 mg via ORAL
  Filled 2023-12-11 (×2): qty 1

## 2023-12-11 MED ORDER — METHYLPREDNISOLONE SODIUM SUCC 125 MG IJ SOLR
125.0000 mg | Freq: Once | INTRAMUSCULAR | Status: AC
Start: 2023-12-11 — End: 2023-12-11
  Administered 2023-12-11: 125 mg via INTRAVENOUS
  Filled 2023-12-11: qty 2

## 2023-12-11 MED ORDER — METOPROLOL TARTRATE 25 MG PO TABS
25.0000 mg | ORAL_TABLET | Freq: Two times a day (BID) | ORAL | Status: DC
  Administered 2023-12-11 – 2023-12-12 (×2): 25 mg via ORAL
  Filled 2023-12-11 (×3): qty 1

## 2023-12-11 MED ORDER — GUAIFENESIN ER 600 MG PO TB12
600.0000 mg | ORAL_TABLET | Freq: Two times a day (BID) | ORAL | Status: DC
  Administered 2023-12-11 – 2023-12-16 (×10): 600 mg via ORAL
  Filled 2023-12-11 (×11): qty 1

## 2023-12-11 MED ORDER — ALBUTEROL SULFATE (2.5 MG/3ML) 0.083% IN NEBU
2.5000 mg | INHALATION_SOLUTION | RESPIRATORY_TRACT | Status: DC | PRN
  Administered 2023-12-12: 2.5 mg via RESPIRATORY_TRACT

## 2023-12-11 MED ORDER — DILTIAZEM HCL ER COATED BEADS 240 MG PO CP24
240.0000 mg | ORAL_CAPSULE | Freq: Every day | ORAL | Status: DC
  Administered 2023-12-11 – 2023-12-12 (×2): 240 mg via ORAL
  Filled 2023-12-11 (×3): qty 1

## 2023-12-11 MED ORDER — METHYLPREDNISOLONE SODIUM SUCC 40 MG IJ SOLR
40.0000 mg | Freq: Two times a day (BID) | INTRAMUSCULAR | Status: AC
  Administered 2023-12-12 (×2): 40 mg via INTRAVENOUS
  Filled 2023-12-11 (×3): qty 1

## 2023-12-11 MED ORDER — POTASSIUM CHLORIDE 10 MEQ/100ML IV SOLN
10.0000 meq | Freq: Once | INTRAVENOUS | Status: AC
Start: 2023-12-11 — End: 2023-12-11
  Administered 2023-12-11: 10 meq via INTRAVENOUS
  Filled 2023-12-11: qty 100

## 2023-12-11 MED ORDER — TAMSULOSIN HCL 0.4 MG PO CAPS
0.4000 mg | ORAL_CAPSULE | Freq: Every day | ORAL | Status: DC
  Administered 2023-12-11 – 2023-12-16 (×6): 0.4 mg via ORAL
  Filled 2023-12-11 (×6): qty 1

## 2023-12-11 MED ORDER — ACETAMINOPHEN 650 MG RE SUPP: 650.0000 mg | Freq: Four times a day (QID) | RECTAL | Status: DC | PRN

## 2023-12-11 MED ORDER — PREDNISONE 20 MG PO TABS
40.0000 mg | ORAL_TABLET | Freq: Every day | ORAL | Status: AC
Start: 2023-12-13 — End: 2023-12-16
  Administered 2023-12-13 – 2023-12-16 (×4): 40 mg via ORAL
  Filled 2023-12-11 (×4): qty 2

## 2023-12-11 MED ORDER — ENOXAPARIN SODIUM 40 MG/0.4ML IJ SOSY
40.0000 mg | PREFILLED_SYRINGE | INTRAMUSCULAR | Status: DC
  Administered 2023-12-11 – 2023-12-15 (×5): 40 mg via SUBCUTANEOUS
  Filled 2023-12-11 (×6): qty 0.4

## 2023-12-11 MED ORDER — POTASSIUM CHLORIDE CRYS ER 20 MEQ PO TBCR
40.0000 meq | EXTENDED_RELEASE_TABLET | Freq: Once | ORAL | Status: AC
  Administered 2023-12-11: 40 meq via ORAL
  Filled 2023-12-11: qty 2

## 2023-12-11 MED ORDER — ACETAMINOPHEN 325 MG PO TABS: 650.0000 mg | ORAL_TABLET | Freq: Four times a day (QID) | ORAL | Status: DC | PRN

## 2023-12-11 MED ORDER — NITROGLYCERIN 0.4 MG SL SUBL: 0.4000 mg | SUBLINGUAL_TABLET | SUBLINGUAL | Status: DC | PRN

## 2023-12-11 MED ORDER — ONDANSETRON HCL 4 MG/2ML IJ SOLN: 4.0000 mg | Freq: Four times a day (QID) | INTRAMUSCULAR | Status: DC | PRN

## 2023-12-11 MED ORDER — HYDROCODONE-ACETAMINOPHEN 5-325 MG PO TABS: 1.0000 | ORAL_TABLET | ORAL | Status: DC | PRN

## 2023-12-11 MED ORDER — IPRATROPIUM-ALBUTEROL 0.5-2.5 (3) MG/3ML IN SOLN
3.0000 mL | Freq: Four times a day (QID) | RESPIRATORY_TRACT | Status: DC
  Administered 2023-12-12 – 2023-12-13 (×4): 3 mL via RESPIRATORY_TRACT
  Filled 2023-12-11 (×5): qty 3

## 2023-12-11 MED ORDER — IOHEXOL 350 MG/ML SOLN
75.0000 mL | Freq: Once | INTRAVENOUS | Status: AC | PRN
  Administered 2023-12-11: 75 mL via INTRAVENOUS

## 2023-12-11 MED ORDER — POTASSIUM CHLORIDE CRYS ER 20 MEQ PO TBCR
20.0000 meq | EXTENDED_RELEASE_TABLET | Freq: Every day | ORAL | Status: DC
  Administered 2023-12-11 – 2023-12-16 (×6): 20 meq via ORAL
  Filled 2023-12-11 (×6): qty 1

## 2023-12-11 MED ORDER — ONDANSETRON HCL 4 MG PO TABS
4.0000 mg | ORAL_TABLET | Freq: Four times a day (QID) | ORAL | Status: DC | PRN
Start: 2023-12-11 — End: 2023-12-16

## 2023-12-11 MED ORDER — ROSUVASTATIN CALCIUM 20 MG PO TABS
20.0000 mg | ORAL_TABLET | Freq: Every day | ORAL | Status: DC
  Administered 2023-12-11 – 2023-12-16 (×6): 20 mg via ORAL
  Filled 2023-12-11 (×6): qty 1

## 2023-12-11 NOTE — Progress Notes (Signed)
 Subjective:    Patient ID: Timothy Eaton, male    DOB: [birth year], 83 y.o.   MRN: [id number]  HPI Here with wife due to SOB  Having SOB "with any exertion" Much worse over the past 2 weeks No fever No cough  Not eating well Losing weight--down 13#since last year  No "hard" chest pain--but gets "catch" at times Some dizziness --but has trouble with balance (staggers with walking)  Current Outpatient Medications on File Prior to Visit  Medication Sig Dispense Refill   aspirin  81 MG EC tablet Take 81 mg by mouth daily.       diltiazem  (CARDIZEM  CD) 240 MG 24 hr capsule TAKE 1 CAPSULE(240 MG) BY MOUTH DAILY 15 capsule 0   hydrochlorothiazide  (HYDRODIURIL ) 12.5 MG tablet Take 1 tablet (12.5 mg total) by mouth daily. 90 tablet 3   isosorbide  mononitrate (IMDUR ) 30 MG 24 hr tablet Take 1 tablet (30 mg total) by mouth daily. 90 tablet 0   metoprolol  tartrate (LOPRESSOR ) 25 MG tablet Take 1 tablet (25 mg total) by mouth 2 (two) times daily. 180 tablet 3   mometasone (ELOCON) 0.1 % ointment Apply topically 2 (two) times daily.     nitroGLYCERIN  (NITROSTAT ) 0.4 MG SL tablet Place 1 tablet (0.4 mg total) under the tongue every 5 (five) minutes as needed (for chest pain or severe dyspnea). 75 tablet 3   potassium chloride  SA (KLOR-CON  M) 20 MEQ tablet TAKE 1 TABLET(20 MEQ) BY MOUTH DAILY 90 tablet 3   rosuvastatin  (CRESTOR ) 20 MG tablet Take 1 tablet (20 mg total) by mouth daily. 90 tablet 3   tamsulosin  (FLOMAX ) 0.4 MG CAPS capsule TAKE 1 CAPSULE(0.4 MG) BY MOUTH DAILY 90 capsule 3   No current facility-administered medications on file prior to visit.    Allergies  Allergen Reactions   Sulfamethoxazole-Trimethoprim Rash    Past Medical History:  Diagnosis Date   AAA (abdominal aortic aneurysm) (HCC)    [place], Repaired, 2005   Atrial fibrillation (HCC)    Postoperative in past   Carotid artery disease (HCC)    Minimal, doppler 2004, 0-39% bilateral, doppler 05/17/09 0-39%  bilateral   COPD (chronic obstructive pulmonary disease) (HCC)    Coronary artery disease    Moderate, catheterization 2005, nuclear scan 9/09 no ischemia, EF 60%, echo   ED (erectile dysfunction)    Ejection fraction    EF 60% echo, September, 2009   Family history of adverse reaction to anesthesia    DAD-WOKE UP DURING SURGERY   GERD (gastroesophageal reflux disease)    Hemorrhoids    Hyperlipidemia    Hypertension    Lung nodule    Followup by pulmonary in past   Palpitations    History palpitations in the past   Palpitations    January, 2013   Shortness of breath     Past Surgical History:  Procedure Laterality Date   ABDOMINAL AORTIC ANEURYSM REPAIR  04/06/04   Dr. Alease Amend SURGERY     CHOLECYSTECTOMY  11/26/02   CYSTOSCOPY W/ RETROGRADES Left 01/05/2020   Procedure: CYSTOSCOPY WITH RETROGRADE PYELOGRAM;  Surgeon: Geraline Knapp, MD;  Location: [facility];  Service: Urology;  Laterality: Left;   CYSTOSCOPY WITH STENT PLACEMENT Left 12/12/2019   Procedure: CYSTOSCOPY WITH STENT PLACEMENT;  Surgeon: Samson Croak, MD;  Location: [facility];  Service: Urology;  Laterality: Left;   CYSTOSCOPY/URETEROSCOPY/HOLMIUM LASER/STENT PLACEMENT Left 01/05/2020   Procedure: CYSTOSCOPY/URETEROSCOPY/HOLMIUM LASER/STENT Exchange;  Surgeon: Geraline Knapp, MD;  Location: [facility];  Service: Urology;  Laterality: Left;   LEFT HEART CATH AND CORONARY ANGIOGRAPHY N/A 03/09/2022   Procedure: LEFT HEART CATH AND CORONARY ANGIOGRAPHY;  Surgeon: Odie Benne, MD;  Location: [facility];  Service: Cardiovascular;  Laterality: N/A;   PILONIDAL CYST EXCISION  11/11    Family History  Problem Relation Age of Onset   Hypertension Mother        ?   Stroke Mother        CVA   Diabetes Mother        DM   Heart attack Father        MI   Cancer Sister        Gall bladder    Stroke Other        All mom's family died of strokes    Social History   Socioeconomic History    Marital status: Married    Spouse name: Not on file   Number of children: 3   Years of education: Not on file   Highest education level: Not on file  Occupational History   Occupation: Truck driver    Comment: Retired  Tobacco Use   Smoking status: Former   Smokeless tobacco: Current    Types: Chew  Substance and Sexual Activity   Alcohol use: No   Drug use: No   Sexual activity: Not on file  Other Topics Concern   Not on file  Social History Narrative   No living will   Would want wife--then daughter Jullie Oiler-- to make decisions for him.   Would accept resuscitation   No feeding tube if cognitively unaware   Social Drivers of Health   Financial Resource Strain: Not on file  Food Insecurity: Not on file  Transportation Needs: Not on file  Physical Activity: Not on file  Stress: Not on file  Social Connections: Not on file  Intimate Partner Violence: Not on file   Review of Systems Sleeps fair--but has night awakening Still chewing tobacco Memory has worsened Just sits around--but no recent travel or trauma    Objective:   Physical Exam Constitutional:      Comments: Appears ill  Cardiovascular:     Rate and Rhythm: Normal rate and regular rhythm.     Heart sounds: No murmur heard.    No gallop.     Comments: Distant heart sounds Pulmonary:     Comments: Decreased breath sounds throughout No dullness Abdominal:     Palpations: Abdomen is soft.     Tenderness: There is no abdominal tenderness.  Musculoskeletal:     Cervical back: Neck supple.     Right lower leg: No edema.     Left lower leg: No edema.     Comments: No calf swelling or tenderness  Lymphadenopathy:     Cervical: No cervical adenopathy.  Neurological:     Mental Status: He is alert.            Assessment & Plan:

## 2023-12-11 NOTE — H&P (Signed)
 History and Physical    Patient: Timothy Eaton QIO:[id number] DOB: [birth year] DOA: 12/11/2023 DOS: the patient was seen and examined on 12/11/2023 PCP: Helaine Llanos, MD  Patient coming from: Home  Chief Complaint:  Chief Complaint  Patient presents with   Shortness of Breath    HPI: Timothy Eaton is a 83 y.o. male with medical history significant for  hypertension, COPD,  CAD, atrial fibrillation post op AAA repair 2005, not on AC, mild carotid artery stenosis, BPH,  CKD stage III, being admitted principally for COPD exacerbation with new O2 requirement of 4 L also noted on workup to have finding of left lower lobe nodule and left kidney, enlarged from previous imaging in 2023. He presented from his PCPs office where he presented with dyspnea on exertion and was found to have an O2 sat of 84% on room air improving to 94% on 4 L.  He denies cough, fever or chills, chest pain, lower extremity pain or swelling. additionally, he endorses generalized weakness and weight loss. Review of cardiology notes, most recently from 10/11/2023 reveals that patient has a history of dyspnea on exertion since 2023 for which she underwent further evaluation with an echo and LHC.  LHC showed diffuse calcific, nonobstructive disease and medical therapy was recommended.  Echocardiogram was normal with EF 60 to 65% with mildly impaired relaxation. ED course and data review: O2 sats in the low to mid 90s on 4 L and tachypneic with exertion to the mid 20s with otherwise normal vitals. Labs notable for troponin 15 and BNP 128 CBC WNL BMP notable for potassium of 3.1, creatinine at baseline at 1.4.Reviewed and EKG, per and interpreted showing NSR at 76 with PVCs and nonspecific ST-T wave changes. CTA chest PE protocol negative for PE but showing a 14 x 9 mm subpleural left lower lobe nodule and a possible complex cyst or mass in the left upper pole of the kidney as well as severe emphysema  Patient was treated with  DuoNebs and Solu-Medrol given oral potassium  Hospitalist consulted for admission.     Review of Systems: {ROS_Text:26778}  Past Medical History:  Diagnosis Date   AAA (abdominal aortic aneurysm) (HCC)    [place], Repaired, 2005   Atrial fibrillation (HCC)    Postoperative in past   Carotid artery disease (HCC)    Minimal, doppler 2004, 0-39% bilateral, doppler 05/17/09 0-39% bilateral   COPD (chronic obstructive pulmonary disease) (HCC)    Coronary artery disease    Moderate, catheterization 2005, nuclear scan 9/09 no ischemia, EF 60%, echo   ED (erectile dysfunction)    Ejection fraction    EF 60% echo, September, 2009   Family history of adverse reaction to anesthesia    DAD-WOKE UP DURING SURGERY   GERD (gastroesophageal reflux disease)    Hemorrhoids    Hyperlipidemia    Hypertension    Lung nodule    Followup by pulmonary in past   Palpitations    History palpitations in the past   Palpitations    January, 2013   Shortness of breath    Past Surgical History:  Procedure Laterality Date   ABDOMINAL AORTIC ANEURYSM REPAIR  04/06/04   Dr. Alease Amend SURGERY     CHOLECYSTECTOMY  11/26/02   CYSTOSCOPY W/ RETROGRADES Left 01/05/2020   Procedure: CYSTOSCOPY WITH RETROGRADE PYELOGRAM;  Surgeon: Geraline Knapp, MD;  Location: [facility];  Service: Urology;  Laterality: Left;   CYSTOSCOPY WITH STENT PLACEMENT Left 12/12/2019  Procedure: CYSTOSCOPY WITH STENT PLACEMENT;  Surgeon: Samson Croak, MD;  Location: [facility];  Service: Urology;  Laterality: Left;   CYSTOSCOPY/URETEROSCOPY/HOLMIUM LASER/STENT PLACEMENT Left 01/05/2020   Procedure: CYSTOSCOPY/URETEROSCOPY/HOLMIUM LASER/STENT Exchange;  Surgeon: Geraline Knapp, MD;  Location: [facility];  Service: Urology;  Laterality: Left;   LEFT HEART CATH AND CORONARY ANGIOGRAPHY N/A 03/09/2022   Procedure: LEFT HEART CATH AND CORONARY ANGIOGRAPHY;  Surgeon: Odie Benne, MD;  Location: [facility];  Service:  Cardiovascular;  Laterality: N/A;   PILONIDAL CYST EXCISION  11/11   Social History:  reports that he has quit smoking. His smokeless tobacco use includes chew. He reports that he does not drink alcohol and does not use drugs.  Allergies  Allergen Reactions   Sulfamethoxazole-Trimethoprim Rash    Family History  Problem Relation Age of Onset   Hypertension Mother        ?   Stroke Mother        CVA   Diabetes Mother        DM   Heart attack Father        MI   Cancer Sister        Gall bladder    Stroke Other        All mom's family died of strokes    Prior to Admission medications   Medication Sig Start Date End Date Taking? Authorizing Provider  aspirin  81 MG EC tablet Take 81 mg by mouth daily.     Yes [provider]  cyanocobalamin  (VITAMIN B12) 1000 MCG tablet Take 1,000 mcg by mouth daily.   Yes [provider]  diltiazem  (CARDIZEM  CD) 240 MG 24 hr capsule TAKE 1 CAPSULE(240 MG) BY MOUTH DAILY 10/11/23  Yes Nahser, Lela Purple, MD  hydrochlorothiazide  (HYDRODIURIL ) 12.5 MG tablet Take 1 tablet (12.5 mg total) by mouth daily. 10/11/23  Yes Nahser, Lela Purple, MD  isosorbide  mononitrate (IMDUR ) 30 MG 24 hr tablet Take 1 tablet (30 mg total) by mouth daily. 10/11/23  Yes Nahser, Lela Purple, MD  metoprolol  tartrate (LOPRESSOR ) 25 MG tablet Take 1 tablet (25 mg total) by mouth 2 (two) times daily. 10/11/23  Yes Nahser, Lela Purple, MD  nitroGLYCERIN  (NITROSTAT ) 0.4 MG SL tablet Place 1 tablet (0.4 mg total) under the tongue every 5 (five) minutes as needed (for chest pain or severe dyspnea). 10/11/23 01/09/24 Yes Nahser, Lela Purple, MD  potassium chloride  SA (KLOR-CON  M) 20 MEQ tablet TAKE 1 TABLET(20 MEQ) BY MOUTH DAILY 10/14/23  Yes Letvak, Richard I, MD  rosuvastatin  (CRESTOR ) 20 MG tablet Take 1 tablet (20 mg total) by mouth daily. 10/11/23  Yes Nahser, Lela Purple, MD  tamsulosin  (FLOMAX ) 0.4 MG CAPS capsule TAKE 1 CAPSULE(0.4 MG) BY MOUTH DAILY 01/22/23  Yes Letvak, Richard I, MD   mometasone (ELOCON) 0.1 % ointment Apply topically 2 (two) times daily. 09/24/23   [provider]    Physical Exam: Vitals:   12/11/23 1939 12/11/23 1941 12/11/23 2200 12/11/23 2222  BP: (!) 124/56  107/62   Pulse: 83  93   Resp: 18  (!) 22   Temp:    98 F (36.7 C)  TempSrc:    Oral  SpO2: 93% 94% 92%   Weight:       Physical Exam  Labs on Admission: I have personally reviewed following labs and imaging studies  CBC: Recent Labs  Lab 12/11/23 1341  WBC 4.3  HGB 14.7  HCT 45.5  MCV 94.8  PLT 146*   Basic Metabolic Panel: Recent Labs  Lab 12/11/23 1341  NA 142  K 3.1*  CL 103  CO2 30  GLUCOSE 106*  BUN 27*  CREATININE 1.40*  CALCIUM  8.6*   GFR: Estimated Creatinine Clearance: 33.9 mL/min (A) (by C-G formula based on SCr of 1.4 mg/dL (H)). Liver Function Tests: No results for input(s): "AST", "ALT", "ALKPHOS", "BILITOT", "PROT", "ALBUMIN" in the last 168 hours. No results for input(s): "LIPASE", "AMYLASE" in the last 168 hours. No results for input(s): "AMMONIA" in the last 168 hours. Coagulation Profile: No results for input(s): "INR", "PROTIME" in the last 168 hours. Cardiac Enzymes: No results for input(s): "CKTOTAL", "CKMB", "CKMBINDEX", "TROPONINI" in the last 168 hours. BNP (last 3 results) No results for input(s): "PROBNP" in the last 8760 hours. HbA1C: No results for input(s): "HGBA1C" in the last 72 hours. CBG: No results for input(s): "GLUCAP" in the last 168 hours. Lipid Profile: No results for input(s): "CHOL", "HDL", "LDLCALC", "TRIG", "CHOLHDL", "LDLDIRECT" in the last 72 hours. Thyroid  Function Tests: No results for input(s): "TSH", "T4TOTAL", "FREET4", "T3FREE", "THYROIDAB" in the last 72 hours. Anemia Panel: No results for input(s): "VITAMINB12", "FOLATE", "FERRITIN", "TIBC", "IRON", "RETICCTPCT" in the last 72 hours. Urine analysis:    Component Value Date/Time   COLORURINE YELLOW (A) 01/09/2020 1053   APPEARANCEUR CLOUDY  (A) 01/09/2020 1053   APPEARANCEUR Cloudy (A) 12/17/2019 1329   LABSPEC 1.013 01/09/2020 1053   PHURINE 7.0 01/09/2020 1053   GLUCOSEU NEGATIVE 01/09/2020 1053   HGBUR LARGE (A) 01/09/2020 1053   BILIRUBINUR pos 10/16/2023 1231   BILIRUBINUR Negative 12/17/2019 1329   KETONESUR 5 (A) 01/09/2020 1053   PROTEINUR Positive (A) 10/16/2023 1231   PROTEINUR 30 (A) 01/09/2020 1053   UROBILINOGEN 1.0 10/16/2023 1231   NITRITE neg 10/16/2023 1231   NITRITE POSITIVE (A) 01/09/2020 1053   LEUKOCYTESUR Negative 10/16/2023 1231   LEUKOCYTESUR LARGE (A) 01/09/2020 1053    Radiological Exams on Admission: CT Angio Chest PE W and/or Wo Contrast Result Date: 12/11/2023 CLINICAL DATA:  Chest pain.  Dyspnea with exertion. EXAM: CT ANGIOGRAPHY CHEST WITH CONTRAST TECHNIQUE: Multidetector CT imaging of the chest was performed using the standard protocol during bolus administration of intravenous contrast. Multiplanar CT image reconstructions and MIPs were obtained to evaluate the vascular anatomy. RADIATION DOSE REDUCTION: This exam was performed according to the departmental dose-optimization program which includes automated exposure control, adjustment of the mA and/or kV according to patient size and/or use of iterative reconstruction technique. CONTRAST:  75mL OMNIPAQUE  IOHEXOL  350 MG/ML SOLN COMPARISON:  Dec 08, 2021.  April 19, 2011. FINDINGS: Cardiovascular: Satisfactory opacification of the pulmonary arteries to the segmental level. No evidence of pulmonary embolism. Normal heart size. No pericardial effusion. Coronary artery calcifications are noted. Mediastinum/Nodes: No enlarged mediastinal, hilar, or axillary lymph nodes. Thyroid  gland, trachea, and esophagus demonstrate no significant findings. Lungs/Pleura: No pneumothorax or pleural effusion is noted. Severe emphysematous disease is noted. Hyperexpansion of the lungs is noted. Minimal bilateral posterior basilar subsegmental atelectasis or scarring  is noted. 14 x 9 mm subpleural nodule is noted in left lower lobe best seen on image number 121 of series 5, which is enlarged compared to prior exam and concerning for malignancy. Upper Abdomen: Possible complex cyst or mass arising from upper pole of left kidney. Musculoskeletal: No chest wall abnormality. No acute or significant osseous findings. Review of the MIP images confirms the above findings. IMPRESSION: 14 x 9 mm subpleural nodule is noted in left lower  lobe which appears to be enlarged compared to prior exam of 2023. Consider one of the following in 3 months for both low-risk and high-risk individuals: (a) repeat chest CT, (b) follow-up PET-CT, or (c) tissue sampling. This recommendation follows the consensus statement: Guidelines for Management of Incidental Pulmonary Nodules Detected on CT Images: From the Fleischner Society 2017; Radiology 2017; 284:228-243. Possible complex cyst or mass arising from upper pole of left kidney. Further evaluation with MRI is recommended to rule out neoplasm or malignancy. No definite evidence of pulmonary embolus. Minimal bilateral posterior basilar subsegmental atelectasis or scarring is noted. Coronary artery calcifications are noted suggesting coronary disease. Severe emphysematous disease is noted. Electronically Signed   By: Rosalene Colon M.D.   On: 12/11/2023 19:05   DG Chest 2 View Result Date: 12/11/2023 CLINICAL DATA:  Shortness of breath. EXAM: CHEST - 2 VIEW COMPARISON:  06/29/2019. FINDINGS: Bilateral lungs appear hyperlucent with coarse bronchovascular markings, in keeping with COPD. There are extensive bullous changes asymmetrically involving the right upper mid lung zones. Bilateral lungs otherwise appear clear. No dense consolidation or lung collapse. Bilateral costophrenic angles are clear. Stable cardio-mediastinal silhouette. No acute osseous abnormalities. The soft tissues are within normal limits. IMPRESSION: *No active cardiopulmonary disease.  COPD. Electronically Signed   By: Beula Brunswick M.D.   On: 12/11/2023 14:45   Data Reviewed for HPI: Relevant notes from primary care and specialist visits, past discharge summaries as available in EHR, including Care Everywhere. Prior diagnostic testing as pertinent to current admission diagnoses Updated medications and problem lists for reconciliation ED course, including vitals, labs, imaging, treatment and response to treatment Triage notes, nursing and pharmacy notes and ED provider's notes Notable results as noted above in HPI      Assessment and Plan: COPD exacerbation (HCC) Acute respiratory failure with hypoxia Schedule and as needed nebulized bronchodilators IV steroids Antitussives, flutter valve Supplemental oxygen and wean as tolerated  Pulmonary nodule 1 cm or greater in diameter - CTA chest shows 14 x 9 mm subpleural nodule is noted in left lower lobe which appears to be enlarged compared to prior exam of 2023.  -Radiology recommendation consider one of the following in 3 months for both low-risk and high-risk individuals:  (a) repeat chest CT,  (b) follow-up PET-CT, or  (c)tissue sampling  Complex renal cyst left kidney - Possible complex cyst or mass arising from upper pole of left kidney.  -Further evaluation with MRI is recommended to rule out neoplasm or malignancy.   CKD (chronic kidney disease), stage IIIa Renal function at baseline  History of postop paroxysmal atrial fibrillation 2005 [facility]) No recurrent episodes per last cardiology documentation 10/09/2023 Not on anticoagulation Continue aspirin   Carotid artery disease (HCC) Mild, followed by vascular  Coronary artery disease Diffuse calcific disease, nonobstructive on cath 03/2022 Continue aspirin , Imdur  30, metoprolol , rosuvastatin  and nitroglycerin  sublingual as needed chest pain  Essential hypertension Holding HCTZ.  Continue diltiazem  and metoprolol     DVT prophylaxis:  Lovenox   Consults: none  Advance Care Planning:   Code Status: Prior   Family Communication: none***  Disposition Plan: Back to previous home environment  Severity of Illness: The appropriate patient status for this patient is OBSERVATION. Observation status is judged to be reasonable and necessary in order to provide the required intensity of service to ensure the patient's safety. The patient's presenting symptoms, physical exam findings, and initial radiographic and laboratory data in the context of their medical condition is felt to place them at  decreased risk for further clinical deterioration. Furthermore, it is anticipated that the patient will be medically stable for discharge from the hospital within 2 midnights of admission.   Author: Lanetta Pion, MD 12/11/2023 10:50 PM  For on call review [email or web address].

## 2023-12-11 NOTE — Assessment & Plan Note (Signed)
 Diffuse calcific disease, nonobstructive on cath 03/2022 Continue aspirin , Imdur  30, metoprolol , rosuvastatin  and nitroglycerin  sublingual as needed chest pain

## 2023-12-11 NOTE — ED Triage Notes (Signed)
 Pt to ED via [facility] from [facility] (lives at home). Pt had gone to PCP because having dyspnea with exertion since 2 weeks. Pt was 84% on RA, placed on 4L then 94%, does not wear oxygen normally. Pt states he feels fine.   Hx a fib. Pt takes hydrochlorothiazide , metoprolol .   126/70, negative for orthostatic changes, afebrile per EMS.  Alert, oriented. Skin dry, respirations unlabored.

## 2023-12-11 NOTE — Assessment & Plan Note (Signed)
 Acute respiratory failure with hypoxia Schedule and as needed nebulized bronchodilators IV steroids Antitussives, flutter valve Supplemental oxygen and wean as tolerated

## 2023-12-11 NOTE — ED Notes (Signed)
Pt given new O2 tank  

## 2023-12-11 NOTE — ED Notes (Signed)
 Cardiac monitoring initiated per Ammon Bales

## 2023-12-11 NOTE — Assessment & Plan Note (Signed)
 Mild, followed by vascular

## 2023-12-11 NOTE — ED Provider Notes (Signed)
 [facility] Provider Note    Event Date/Time   First MD Initiated Contact with Patient 12/11/23 1649     (approximate)   History   Shortness of Breath   HPI  Timothy Eaton is a 83 y.o. male who presents to the ED for evaluation of Shortness of Breath   I review a cardiology clinic visit from March.  2023 catheterization with mild/moderate multivessel nonobstructive disease, has had postoperative A-fib in the past, but no AC.  COPD.  AAA repair.  Patient presents for evaluation of subacute shortness of breath, unintentional weight loss   Physical Exam   Triage Vital Signs: ED Triage Vitals  Encounter Vitals Group     BP 12/11/23 1334 126/70     Systolic BP Percentile --      Diastolic BP Percentile --      Pulse Rate 12/11/23 1334 66     Resp 12/11/23 1334 20     Temp 12/11/23 1334 98.8 F (37.1 C)     Temp Source 12/11/23 1334 Oral     SpO2 12/11/23 1334 94 %     Weight 12/11/23 1334 134 lb 7.7 oz (61 kg)     Height --      Head Circumference --      Peak Flow --      Pain Score 12/11/23 1337 0     Pain Loc --      Pain Education --      Exclude from Growth Chart --     Most recent vital signs: Vitals:   12/11/23 2200 12/11/23 2222  BP: 107/62   Pulse: 93   Resp: (!) 22   Temp:  98 F (36.7 C)  SpO2: 92%     General: Awake, no distress.  Frail and thin but pleasant and conversational.  Minimally hard of hearing CV:  Good peripheral perfusion.  Resp:  Mild tachypnea without distress.  Wheezing throughout and slightly decreased airflow. Abd:  No distention.  MSK:  No deformity noted.  Neuro:  No focal deficits appreciated. Other:     ED Results / Procedures / Treatments   Labs (all labs ordered are listed, but only abnormal results are displayed) Labs Reviewed  BASIC METABOLIC PANEL WITH GFR - Abnormal; Notable for the following components:      Result Value   Potassium 3.1 (*)    Glucose, Bld 106 (*)    BUN 27 (*)     Creatinine, Ser 1.40 (*)    Calcium  8.6 (*)    GFR, Estimated 50 (*)    All other components within normal limits  CBC - Abnormal; Notable for the following components:   Platelets 146 (*)    All other components within normal limits  BRAIN NATRIURETIC PEPTIDE - Abnormal; Notable for the following components:   B Natriuretic Peptide 128.4 (*)    All other components within normal limits  TROPONIN I (HIGH SENSITIVITY)  TROPONIN I (HIGH SENSITIVITY)    EKG Sinus rhythm with a rate of 76 bpm.  PVC.  Normal axis and intervals without STEMI.  RADIOLOGY CXR interpreted by me without evidence of acute cardiopulmonary pathology. CTA chest interpreted by me without signs of PE  Official radiology report(s): CT Angio Chest PE W and/or Wo Contrast Result Date: 12/11/2023 CLINICAL DATA:  Chest pain.  Dyspnea with exertion. EXAM: CT ANGIOGRAPHY CHEST WITH CONTRAST TECHNIQUE: Multidetector CT imaging of the chest was performed using the standard protocol during bolus administration  of intravenous contrast. Multiplanar CT image reconstructions and MIPs were obtained to evaluate the vascular anatomy. RADIATION DOSE REDUCTION: This exam was performed according to the departmental dose-optimization program which includes automated exposure control, adjustment of the mA and/or kV according to patient size and/or use of iterative reconstruction technique. CONTRAST:  75mL OMNIPAQUE  IOHEXOL  350 MG/ML SOLN COMPARISON:  Dec 08, 2021.  April 19, 2011. FINDINGS: Cardiovascular: Satisfactory opacification of the pulmonary arteries to the segmental level. No evidence of pulmonary embolism. Normal heart size. No pericardial effusion. Coronary artery calcifications are noted. Mediastinum/Nodes: No enlarged mediastinal, hilar, or axillary lymph nodes. Thyroid  gland, trachea, and esophagus demonstrate no significant findings. Lungs/Pleura: No pneumothorax or pleural effusion is noted. Severe emphysematous disease is noted.  Hyperexpansion of the lungs is noted. Minimal bilateral posterior basilar subsegmental atelectasis or scarring is noted. 14 x 9 mm subpleural nodule is noted in left lower lobe best seen on image number 121 of series 5, which is enlarged compared to prior exam and concerning for malignancy. Upper Abdomen: Possible complex cyst or mass arising from upper pole of left kidney. Musculoskeletal: No chest wall abnormality. No acute or significant osseous findings. Review of the MIP images confirms the above findings. IMPRESSION: 14 x 9 mm subpleural nodule is noted in left lower lobe which appears to be enlarged compared to prior exam of 2023. Consider one of the following in 3 months for both low-risk and high-risk individuals: (a) repeat chest CT, (b) follow-up PET-CT, or (c) tissue sampling. This recommendation follows the consensus statement: Guidelines for Management of Incidental Pulmonary Nodules Detected on CT Images: From the Fleischner Society 2017; Radiology 2017; 284:228-243. Possible complex cyst or mass arising from upper pole of left kidney. Further evaluation with MRI is recommended to rule out neoplasm or malignancy. No definite evidence of pulmonary embolus. Minimal bilateral posterior basilar subsegmental atelectasis or scarring is noted. Coronary artery calcifications are noted suggesting coronary disease. Severe emphysematous disease is noted. Electronically Signed   By: Rosalene Colon M.D.   On: 12/11/2023 19:05   DG Chest 2 View Result Date: 12/11/2023 CLINICAL DATA:  Shortness of breath. EXAM: CHEST - 2 VIEW COMPARISON:  06/29/2019. FINDINGS: Bilateral lungs appear hyperlucent with coarse bronchovascular markings, in keeping with COPD. There are extensive bullous changes asymmetrically involving the right upper mid lung zones. Bilateral lungs otherwise appear clear. No dense consolidation or lung collapse. Bilateral costophrenic angles are clear. Stable cardio-mediastinal silhouette. No acute  osseous abnormalities. The soft tissues are within normal limits. IMPRESSION: *No active cardiopulmonary disease. COPD. Electronically Signed   By: Beula Brunswick M.D.   On: 12/11/2023 14:45    PROCEDURES and INTERVENTIONS:  .1-3 Lead EKG Interpretation  Performed by: Arline Bennett, MD Authorized by: Arline Bennett, MD     Interpretation: normal     ECG rate:  96   ECG rate assessment: normal     Rhythm: sinus rhythm     Ectopy: none     Conduction: normal   .Critical Care  Performed by: Arline Bennett, MD Authorized by: Arline Bennett, MD   Critical care provider statement:    Critical care time (minutes):  30   Critical care time was exclusive of:  Separately billable procedures and treating other patients   Critical care was necessary to treat or prevent imminent or life-threatening deterioration of the following conditions:  Respiratory failure   Critical care was time spent personally by me on the following activities:  Development of treatment plan with patient  or surrogate, discussions with consultants, evaluation of patient's response to treatment, examination of patient, ordering and review of laboratory studies, ordering and review of radiographic studies, ordering and performing treatments and interventions, pulse oximetry, re-evaluation of patient's condition and review of old charts   Medications  ipratropium-albuterol  (DUONEB) 0.5-2.5 (3) MG/3ML nebulizer solution 6 mL (6 mLs Nebulization Given 12/11/23 1755)  methylPREDNISolone sodium succinate (SOLU-MEDROL) 125 mg/2 mL injection 125 mg (125 mg Intravenous Given 12/11/23 1755)  iohexol  (OMNIPAQUE ) 350 MG/ML injection 75 mL (75 mLs Intravenous Contrast Given 12/11/23 1836)  potassium chloride  SA (KLOR-CON  M) CR tablet 40 mEq (40 mEq Oral Given 12/11/23 2036)  potassium chloride  10 mEq in 100 mL IVPB (0 mEq Intravenous Stopped 12/11/23 2221)  ipratropium-albuterol  (DUONEB) 0.5-2.5 (3) MG/3ML nebulizer solution 3 mL (3 mLs Nebulization  Given 12/11/23 2047)     IMPRESSION / MDM / ASSESSMENT AND PLAN / ED COURSE  I reviewed the triage vital signs and the nursing notes.  Differential diagnosis includes, but is not limited to, ACS, PTX, PNA, muscle strain/spasm, PE, dissection, anxiety, pleural effusion, COPD exacerbation, lung cancer  {Patient presents with symptoms of an acute illness or injury that is potentially life-threatening.  Patient presents short of breath with signs of COPD exacerbation and hypoxia requiring medical admission.  Hypokalemia replaced orally and IV.  No leukocytosis.  2 negative troponins.  Marginally elevated BNP, but does not look grossly volume overload.  Imaging without PE or infiltrate.  Due to persistent hypoxia consult medicine for admission.  Clinical Course as of 12/11/23 2250  Wed Dec 11, 2023  1949 Reassessed and discussed CT results.  Possibility of cancer.  We discussed both lung nodule and complex cyst on kidney.  I reassessed the patient he has significantly improved airflow and nearly resolution of wheezing.  He reports feeling better.  We discussed plan of care.  Will try another breathing treatment, potassium replacement and trialing off of oxygen for hopeful outpatient management. [DS]  2130 Reassessed.  Took him off of supplemental oxygen just while he was seated and he desaturates to 85-86%.  I recommend admission and he is agreeable. [DS]    Clinical Course User Index [DS] Arline Bennett, MD     FINAL CLINICAL IMPRESSION(S) / ED DIAGNOSES   Final diagnoses:  COPD exacerbation (HCC)  Hypoxia     Rx / DC Orders   ED Discharge Orders     None        Note:  This document was prepared using Dragon voice recognition software and may include unintentional dictation errors.   Arline Bennett, MD 12/11/23 2252

## 2023-12-11 NOTE — Assessment & Plan Note (Addendum)
-   CTA chest shows 14 x 9 mm subpleural nodule is noted in left lower lobe which appears to be enlarged compared to prior exam of 2023.  -Radiology recommendation consider one of the following in 3 months for both low-risk and high-risk individuals:  (a) repeat chest CT,  (b) follow-up PET-CT, or  (c)tissue sampling

## 2023-12-11 NOTE — Assessment & Plan Note (Signed)
 -  Renal function  at baseline

## 2023-12-11 NOTE — Assessment & Plan Note (Signed)
 Holding HCTZ.  Continue diltiazem  and metoprolol 

## 2023-12-11 NOTE — Assessment & Plan Note (Addendum)
-   Possible complex cyst or mass arising from upper pole of left kidney.  -Further evaluation with MRI is recommended to rule out neoplasm or malignancy.

## 2023-12-11 NOTE — Assessment & Plan Note (Addendum)
 No recurrent episodes per last cardiology documentation 10/09/2023 Not on anticoagulation Continue aspirin 

## 2023-12-11 NOTE — Assessment & Plan Note (Addendum)
 Has had worsening status for some time---but really bad in last 2 weeks Sats 80%---but finally over 90% on 3 l/min oxygen No CHF--and doubt acute coronary syndrome Mostly likely pulmonary---neoplasm, pulmonary embolus, progressive pulmonary interstitial disease Given the degree of hypoxia--and lack of diagnosis--will transfer to hospital via EMS for further evaluation

## 2023-12-12 DIAGNOSIS — J9601 Acute respiratory failure with hypoxia: Secondary | ICD-10-CM | POA: Diagnosis present

## 2023-12-12 DIAGNOSIS — J9611 Chronic respiratory failure with hypoxia: Secondary | ICD-10-CM | POA: Diagnosis present

## 2023-12-12 DIAGNOSIS — R911 Solitary pulmonary nodule: Secondary | ICD-10-CM | POA: Diagnosis present

## 2023-12-12 DIAGNOSIS — J441 Chronic obstructive pulmonary disease with (acute) exacerbation: Secondary | ICD-10-CM | POA: Diagnosis present

## 2023-12-12 DIAGNOSIS — Z681 Body mass index (BMI) 19 or less, adult: Secondary | ICD-10-CM | POA: Diagnosis not present

## 2023-12-12 DIAGNOSIS — I2510 Atherosclerotic heart disease of native coronary artery without angina pectoris: Secondary | ICD-10-CM | POA: Diagnosis present

## 2023-12-12 DIAGNOSIS — E876 Hypokalemia: Secondary | ICD-10-CM | POA: Diagnosis present

## 2023-12-12 DIAGNOSIS — N400 Enlarged prostate without lower urinary tract symptoms: Secondary | ICD-10-CM | POA: Diagnosis present

## 2023-12-12 DIAGNOSIS — R7303 Prediabetes: Secondary | ICD-10-CM | POA: Diagnosis present

## 2023-12-12 DIAGNOSIS — Z833 Family history of diabetes mellitus: Secondary | ICD-10-CM | POA: Diagnosis not present

## 2023-12-12 DIAGNOSIS — Z8679 Personal history of other diseases of the circulatory system: Secondary | ICD-10-CM | POA: Diagnosis not present

## 2023-12-12 DIAGNOSIS — T380X5A Adverse effect of glucocorticoids and synthetic analogues, initial encounter: Secondary | ICD-10-CM | POA: Diagnosis present

## 2023-12-12 DIAGNOSIS — R0902 Hypoxemia: Secondary | ICD-10-CM | POA: Diagnosis present

## 2023-12-12 DIAGNOSIS — I480 Paroxysmal atrial fibrillation: Secondary | ICD-10-CM | POA: Diagnosis present

## 2023-12-12 DIAGNOSIS — R739 Hyperglycemia, unspecified: Secondary | ICD-10-CM | POA: Diagnosis present

## 2023-12-12 DIAGNOSIS — I493 Ventricular premature depolarization: Secondary | ICD-10-CM | POA: Diagnosis present

## 2023-12-12 DIAGNOSIS — R634 Abnormal weight loss: Secondary | ICD-10-CM | POA: Diagnosis present

## 2023-12-12 DIAGNOSIS — N281 Cyst of kidney, acquired: Secondary | ICD-10-CM | POA: Diagnosis present

## 2023-12-12 DIAGNOSIS — I129 Hypertensive chronic kidney disease with stage 1 through stage 4 chronic kidney disease, or unspecified chronic kidney disease: Secondary | ICD-10-CM | POA: Diagnosis present

## 2023-12-12 DIAGNOSIS — Z882 Allergy status to sulfonamides status: Secondary | ICD-10-CM | POA: Diagnosis not present

## 2023-12-12 DIAGNOSIS — I6529 Occlusion and stenosis of unspecified carotid artery: Secondary | ICD-10-CM | POA: Diagnosis present

## 2023-12-12 DIAGNOSIS — Z7982 Long term (current) use of aspirin: Secondary | ICD-10-CM | POA: Diagnosis not present

## 2023-12-12 DIAGNOSIS — F17220 Nicotine dependence, chewing tobacco, uncomplicated: Secondary | ICD-10-CM | POA: Diagnosis present

## 2023-12-12 DIAGNOSIS — N1831 Chronic kidney disease, stage 3a: Secondary | ICD-10-CM | POA: Diagnosis present

## 2023-12-12 DIAGNOSIS — Z8249 Family history of ischemic heart disease and other diseases of the circulatory system: Secondary | ICD-10-CM | POA: Diagnosis not present

## 2023-12-12 DIAGNOSIS — Z9049 Acquired absence of other specified parts of digestive tract: Secondary | ICD-10-CM | POA: Diagnosis not present

## 2023-12-12 LAB — BASIC METABOLIC PANEL WITH GFR
Anion gap: 13 (ref 5–15)
BUN: 33 mg/dL — ABNORMAL HIGH (ref 8–23)
CO2: 28 mmol/L (ref 22–32)
Calcium: 8.8 mg/dL — ABNORMAL LOW (ref 8.9–10.3)
Chloride: 102 mmol/L (ref 98–111)
Creatinine, Ser: 1.52 mg/dL — ABNORMAL HIGH (ref 0.61–1.24)
GFR, Estimated: 45 mL/min — ABNORMAL LOW (ref >60)
Glucose, Bld: 262 mg/dL — ABNORMAL HIGH (ref 70–99)
Potassium: 3.4 mmol/L — ABNORMAL LOW (ref 3.5–5.1)
Sodium: 143 mmol/L (ref 135–145)

## 2023-12-12 LAB — RESP PANEL BY RT-PCR (RSV, FLU A&B, COVID)  RVPGX2
Influenza A by PCR: NEGATIVE
Influenza B by PCR: NEGATIVE
Resp Syncytial Virus by PCR: NEGATIVE
SARS Coronavirus 2 by RT PCR: NEGATIVE

## 2023-12-12 MED ORDER — POTASSIUM CHLORIDE CRYS ER 20 MEQ PO TBCR: 40.0000 meq | EXTENDED_RELEASE_TABLET | Freq: Once | ORAL | Status: DC

## 2023-12-12 NOTE — Progress Notes (Signed)
 PROGRESS NOTE    Timothy Eaton  UEA:[id number] DOB: 05/08/1941 DOA: 12/11/2023 PCP: Helaine Llanos, MD  [id number]/[id number]-AA  LOS: 0 days   Brief hospital course:   Assessment & Plan: Timothy Eaton is a 83 y.o. male with medical history significant for  hypertension, COPD,  CAD, atrial fibrillation post op AAA repair 2005, not on AC, mild carotid artery stenosis, BPH,  CKD stage III, being admitted principally for COPD exacerbation with new O2 requirement of 4 L also noted on workup to have finding of left lower lobe nodule and left kidney, enlarged from previous imaging in 2023. He presented from his PCPs office where he presented with dyspnea on exertion and was found to have an O2 sat of 84% on room air improving to 94% on 4 L.    COPD exacerbation (HCC) Acute respiratory failure with hypoxia --CXR no acute finding.  Pt needed 5L O2 with sats around 92-93%.  No O2 requirement at baseline. --s/p IV solumedrol with improvement in his dyspnea --cont steorid --cont DuoNeb scheduled --Continue supplemental O2 to keep sats >=90%, wean as tolerated  Pulmonary nodule 1 cm or greater in diameter - CTA chest shows 14 x 9 mm subpleural nodule is noted in left lower lobe which appears to be enlarged compared to prior exam of 2023.  -Radiology recommendation consider one of the following in 3 months for both low-risk and high-risk individuals:  (a) repeat chest CT,  (b) follow-up PET-CT, or  (c) tissue sampling --outpatient f/u with PCP, discussed with pt and daughter  Complex renal cyst left kidney - Possible complex cyst or mass arising from upper pole of left kidney.  -Further evaluation with MRI is recommended to rule out neoplasm or malignancy. --outpatient f/u with PCP, discussed with pt and daughter   CKD (chronic kidney disease), stage IIIa Renal function at baseline  History of postop paroxysmal atrial fibrillation 2005 [facility]) No recurrent episodes per last cardiology documentation  10/09/2023 Not on anticoagulation  Carotid artery disease (HCC) Mild, followed by vascular --cont ASA and statin  Coronary artery disease Diffuse calcific disease, nonobstructive on cath 03/2022 --cont ASA and statin  Essential hypertension --cont cardizem , Imdur  and Lopressor   Hypokalemia --monitor and supplement PRN   DVT prophylaxis: Lovenox  SQ Code Status: Full code  Family Communication: daughter updated at bedside today Level of care: Med-Surg Dispo:   The patient is from: home Anticipated d/c is to: home Anticipated d/c date is: 2-3 days   Subjective and Interval History:  Pt reported feeling much better, and wanted to go home!  But still had 5L O2 requirement that's new.   Objective: Vitals:   12/12/23 1400 12/12/23 1406 12/12/23 1500 12/12/23 1753  BP: 113/62  (!) 108/58 124/74  Pulse: 73   65  Resp: (!) 22  20 20   Temp:  98.1 F (36.7 C)  97.9 F (36.6 C)  TempSrc:  Oral    SpO2: 93%   90%  Weight:      Height:        Intake/Output Summary (Last 24 hours) at 12/12/2023 1925 Last data filed at 12/12/2023 0440 Gross per 24 hour  Intake --  Output 125 ml  Net -125 ml   Filed Weights   12/11/23 1334 12/12/23 0222  Weight: 61 kg 61 kg    Examination:   Constitutional: NAD, AAOx3 HEENT: conjunctivae and lids normal, EOMI CV: No cyanosis.   RESP: normal respiratory effort, on 5L sating 92-93% Neuro: II - XII  grossly intact.   Psych: Normal mood and affect.  Appropriate judgement and reason   Data Reviewed: I have personally reviewed labs and imaging studies  Time spent: 50 minutes  Garrison Kanner, MD [facility] If 7PM-7AM, please contact night-coverage 12/12/2023, 7:25 PM

## 2023-12-12 NOTE — ED Notes (Signed)
 CCMD contacted to move patient for telemetry monitoring.

## 2023-12-12 NOTE — Care Management Obs Status (Signed)
 MEDICARE OBSERVATION STATUS NOTIFICATION   Patient Details  Name: Timothy Eaton MRN: [id number] Date of Birth: 1941/01/25   Medicare Observation Status Notification Given:  Rudolph Cost, CMA 12/12/2023, 10:25 AM

## 2023-12-13 DIAGNOSIS — J9601 Acute respiratory failure with hypoxia: Secondary | ICD-10-CM | POA: Diagnosis not present

## 2023-12-13 LAB — CBC
HCT: 36.9 % — ABNORMAL LOW (ref 39.0–52.0)
Hemoglobin: 12.2 g/dL — ABNORMAL LOW (ref 13.0–17.0)
MCH: 30.8 pg (ref 26.0–34.0)
MCHC: 33.1 g/dL (ref 30.0–36.0)
MCV: 93.2 fL (ref 80.0–100.0)
Platelets: 119 10*3/uL — ABNORMAL LOW (ref 150–400)
RBC: 3.96 MIL/uL — ABNORMAL LOW (ref 4.22–5.81)
RDW: 14.5 % (ref 11.5–15.5)
WBC: 6.4 10*3/uL (ref 4.0–10.5)
nRBC: 0 % (ref 0.0–0.2)

## 2023-12-13 LAB — BASIC METABOLIC PANEL WITH GFR
Anion gap: 10 (ref 5–15)
BUN: 38 mg/dL — ABNORMAL HIGH (ref 8–23)
CO2: 28 mmol/L (ref 22–32)
Calcium: 8.9 mg/dL (ref 8.9–10.3)
Chloride: 103 mmol/L (ref 98–111)
Creatinine, Ser: 1.42 mg/dL — ABNORMAL HIGH (ref 0.61–1.24)
GFR, Estimated: 49 mL/min — ABNORMAL LOW (ref >60)
Glucose, Bld: 176 mg/dL — ABNORMAL HIGH (ref 70–99)
Potassium: 4.1 mmol/L (ref 3.5–5.1)
Sodium: 141 mmol/L (ref 135–145)

## 2023-12-13 LAB — MAGNESIUM: Magnesium: 2.5 mg/dL — ABNORMAL HIGH (ref 1.7–2.4)

## 2023-12-13 MED ORDER — IPRATROPIUM-ALBUTEROL 0.5-2.5 (3) MG/3ML IN SOLN
3.0000 mL | Freq: Three times a day (TID) | RESPIRATORY_TRACT | Status: DC
  Administered 2023-12-13 – 2023-12-16 (×10): 3 mL via RESPIRATORY_TRACT
  Filled 2023-12-13 (×9): qty 3

## 2023-12-13 MED ORDER — ENSURE ENLIVE PO LIQD
237.0000 mL | Freq: Two times a day (BID) | ORAL | Status: DC
  Administered 2023-12-13 – 2023-12-15 (×4): 237 mL via ORAL

## 2023-12-13 NOTE — Evaluation (Signed)
 Physical Therapy Evaluation Patient Details Name: Timothy Eaton MRN: [id number] DOB: May 11, 1941 Today's Date: 12/13/2023  History of Present Illness  Timothy Eaton is a 83 y.o. male with medical history significant for  hypertension, COPD,  CAD, atrial fibrillation post op AAA repair 2005, not on AC, mild carotid artery stenosis, BPH,  CKD stage III, being admitted principally for COPD exacerbation with new O2 requirement of 4 L also noted on workup to have finding of left lower lobe nodule and left kidney, enlarged from previous imaging in 2023.   Clinical Impression  Patient received in bed, daughter at bedside. Patient is pleasant and agrees to PT assessment. Patient is received on 4 liters O2 with sats at 93%. Patient is able to stand with supervision, upon initial attempt to walk, patient had one lob requiring mod A to recover. Therefore patient ambulated with RW. He required 8 liters of O2 with mobility with sats at 89%. ( Attempted first at 4 liters, then 6 but O2 sats in low 80%s) Patient will continue to benefit from skilled PT to improve endurance and safety with mobility.       If plan is discharge home, recommend the following: A little help with walking and/or transfers;A little help with bathing/dressing/bathroom;Help with stairs or ramp for entrance   Can travel by private vehicle    yes    Equipment Recommendations Rolling walker (2 wheels)  Recommendations for Other Services       Functional Status Assessment Patient has had a recent decline in their functional status and demonstrates the ability to make significant improvements in function in a reasonable and predictable amount of time.     Precautions / Restrictions Precautions Precautions: Fall Recall of Precautions/Restrictions: Intact Restrictions Weight Bearing Restrictions Per Provider Order: No      Mobility  Bed Mobility Overal bed mobility: Modified Independent                   Transfers Overall transfer level: Needs assistance Equipment used: None Transfers: Sit to/from Stand Sit to Stand: Supervision                Ambulation/Gait Ambulation/Gait assistance: Contact guard assist Gait Distance (Feet): 300 Feet Assistive device: Rolling walker (2 wheels)   Gait velocity: initially quick pace, cues needed to slow pace for improved oxygenation and safety     General Gait Details: lost balance with initial attempt to stand and take a couple of steps, therefore patient given RW and used RW throughout the rest of session. Patient began ambulation on 4 liters, bumped up to 6 liters, then 8 liters with O2 sats around 89% on 8 liters during mobility. Returned patient to 4 liters upon return to bed with sats at 95%.  Stairs            Wheelchair Mobility     Tilt Bed    Modified Rankin (Stroke Patients Only)       Balance Overall balance assessment: Needs assistance Sitting-balance support: Feet supported Sitting balance-Leahy Scale: Normal     Standing balance support: Bilateral upper extremity supported, During functional activity, Reliant on assistive device for balance Standing balance-Leahy Scale: Fair Standing balance comment: 1 lob initially with ambulation, then ambulated with RW.                             Pertinent Vitals/Pain Pain Assessment Pain Assessment: No/denies pain    Home Living Family/patient  expects to be discharged to:: Private residence Living Arrangements:  (patient lives with 30 year old wife, has 3 daughters who are in and out and assist as needed) Available Help at Discharge: Available 24 hours/day Type of Home: House Home Access: Stairs to enter   Secretary/administrator of Steps: 1   Home Layout: One level Home Equipment: None      Prior Function Prior Level of Function : Independent/Modified Independent                     Extremity/Trunk Assessment   Upper Extremity  Assessment Upper Extremity Assessment: Generalized weakness    Lower Extremity Assessment Lower Extremity Assessment: Generalized weakness    Cervical / Trunk Assessment Cervical / Trunk Assessment: Kyphotic  Communication   Communication Communication: Impaired Factors Affecting Communication: Hearing impaired    Cognition Arousal: Alert Behavior During Therapy: WFL for tasks assessed/performed   PT - Cognitive impairments: History of cognitive impairments, Memory, Safety/Judgement, Problem solving, Awareness                         Following commands: Intact       Cueing Cueing Techniques: Verbal cues     General Comments      Exercises     Assessment/Plan    PT Assessment Patient needs continued PT services  PT Problem List Decreased strength;Decreased activity tolerance;Decreased balance;Decreased mobility;Cardiopulmonary status limiting activity;Decreased safety awareness;Decreased knowledge of precautions;Decreased knowledge of use of DME       PT Treatment Interventions DME instruction;Gait training;Stair training;Functional mobility training;Therapeutic activities;Therapeutic exercise;Balance training;Neuromuscular re-education;Cognitive remediation;Patient/family education    PT Goals (Current goals can be found in the Care Plan section)  Acute Rehab PT Goals Patient Stated Goal: return home PT Goal Formulation: With patient/family Time For Goal Achievement: 12/20/23 Potential to Achieve Goals: Good    Frequency Min 3X/week     Co-evaluation               AM-PAC PT "6 Clicks" Mobility  Outcome Measure Help needed turning from your back to your side while in a flat bed without using bedrails?: None Help needed moving from lying on your back to sitting on the side of a flat bed without using bedrails?: None Help needed moving to and from a bed to a chair (including a wheelchair)?: A Little Help needed standing up from a chair using  your arms (e.g., wheelchair or bedside chair)?: A Little Help needed to walk in hospital room?: A Little Help needed climbing 3-5 steps with a railing? : A Lot 6 Click Score: 19    End of Session Equipment Utilized During Treatment: Oxygen Activity Tolerance: Patient tolerated treatment well Patient left: in bed;with call bell/phone within reach;with bed alarm set;with family/visitor present Nurse Communication: Mobility status PT Visit Diagnosis: Other abnormalities of gait and mobility (R26.89);Muscle weakness (generalized) (M62.81);Difficulty in walking, not elsewhere classified (R26.2);Unsteadiness on feet (R26.81)    Time: 1333-1400 PT Time Calculation (min) (ACUTE ONLY): 27 min   Charges:   PT Evaluation $PT Eval Moderate Complexity: 1 Mod PT Treatments $Gait Training: 8-22 mins PT General Charges $$ ACUTE PT VISIT: 1 Visit         Gaylene Moylan, PT, GCS 12/13/23,2:10 PM

## 2023-12-13 NOTE — Progress Notes (Signed)
 PROGRESS NOTE    Timothy Eaton  RUE:[id number] DOB: 13-Jun-1941 DOA: 12/11/2023 PCP: Helaine Llanos, MD  118A/118A-AA  LOS: 1 day   Brief hospital course:   Assessment & Plan: Timothy Eaton is a 83 y.o. male with medical history significant for  hypertension, COPD,  CAD, atrial fibrillation post op AAA repair 2005, not on AC, mild carotid artery stenosis, BPH,  CKD stage III, being admitted principally for COPD exacerbation with new O2 requirement of 4 L also noted on workup to have finding of left lower lobe nodule and left kidney, enlarged from previous imaging in 2023. He presented from his PCPs office where he presented with dyspnea on exertion and was found to have an O2 sat of 84% on room air improving to 94% on 4 L.    COPD exacerbation (HCC) Acute respiratory failure with hypoxia --CXR no acute finding.  Pt needed 5L O2 with sats around 92-93%.  No O2 requirement at baseline. --s/p IV solumedrol with improvement in his dyspnea Plan: --cont steroid as prednisone   --cont DuoNeb scheduled --Continue supplemental O2 to keep sats >=90%, wean as tolerated  Pulmonary nodule 1 cm or greater in diameter - CTA chest shows 14 x 9 mm subpleural nodule is noted in left lower lobe which appears to be enlarged compared to prior exam of 2023.  -Radiology recommendation consider one of the following in 3 months for both low-risk and high-risk individuals:  (a) repeat chest CT,  (b) follow-up PET-CT, or  (c) tissue sampling --outpatient f/u with PCP, discussed with pt and daughter  Complex renal cyst left kidney - Possible complex cyst or mass arising from upper pole of left kidney.  -Further evaluation with MRI is recommended to rule out neoplasm or malignancy. --outpatient f/u with PCP, discussed with pt and daughter   CKD (chronic kidney disease), stage IIIa Renal function at baseline  History of postop paroxysmal atrial fibrillation 2005 [facility]) No recurrent episodes per last  cardiology documentation 10/09/2023 Not on anticoagulation  Carotid artery disease (HCC) Mild, followed by vascular --cont ASA and statin  Coronary artery disease Diffuse calcific disease, nonobstructive on cath 03/2022 --cont ASA and statin  Essential hypertension --hold home cardizem , Imdur  and lopressor  due to low BP  Hypokalemia --monitor and supplement PRN   DVT prophylaxis: Lovenox  SQ Code Status: Full code  Family Communication: daughter updated at bedside today Level of care: Med-Surg Dispo:   The patient is from: home Anticipated d/c is to: home Anticipated d/c date is: 2-3 days   Subjective and Interval History:  Pt did not feel short of breath, however, needed 4L at rest to maintain O2 sat around 90%, and needed 8L to maintain 89%.   Objective: Vitals:   12/13/23 0934 12/13/23 1132 12/13/23 1401 12/13/23 1521  BP: (!) 111/57 (!) 112/59  108/78  Pulse: (!) 55 64  89  Resp:    16  Temp: 97.6 F (36.4 C) (!) 97.4 F (36.3 C)  98.1 F (36.7 C)  TempSrc: Oral   Oral  SpO2: 90% (!) 89% 93% 94%  Weight:      Height:        Intake/Output Summary (Last 24 hours) at 12/13/2023 1901 Last data filed at 12/13/2023 1456 Gross per 24 hour  Intake 120 ml  Output --  Net 120 ml   Filed Weights   12/11/23 1334 12/12/23 0222  Weight: 61 kg 61 kg    Examination:   Constitutional: NAD, AAOx3 HEENT: conjunctivae and  lids normal, EOMI CV: No cyanosis.   RESP: normal respiratory effort, [place] not in his nostrils Neuro: II - XII grossly intact.   Psych: Normal mood and affect.     Data Reviewed: I have personally reviewed labs and imaging studies  Time spent: 35 minutes  Timothy Kanner, MD [facility] If 7PM-7AM, please contact night-coverage 12/13/2023, 7:01 PM

## 2023-12-13 NOTE — Plan of Care (Signed)
  Problem: Education: Goal: Knowledge of General Education information will improve Description: Including pain rating scale, medication(s)/side effects and non-pharmacologic comfort measures Outcome: Progressing   Problem: Health Behavior/Discharge Planning: Goal: Ability to manage health-related needs will improve Outcome: Progressing   Problem: Clinical Measurements: Goal: Ability to maintain clinical measurements within normal limits will improve Outcome: Progressing Goal: Will remain free from infection Outcome: Progressing Goal: Diagnostic test results will improve Outcome: Progressing Goal: Respiratory complications will improve Outcome: Progressing Goal: Cardiovascular complication will be avoided Outcome: Progressing   Problem: Activity: Goal: Risk for activity intolerance will decrease Outcome: Progressing   Problem: Nutrition: Goal: Adequate nutrition will be maintained Outcome: Progressing   Problem: Coping: Goal: Level of anxiety will decrease Outcome: Progressing   Problem: Elimination: Goal: Will not experience complications related to bowel motility Outcome: Progressing Goal: Will not experience complications related to urinary retention Outcome: Progressing   Problem: Pain Managment: Goal: General experience of comfort will improve and/or be controlled Outcome: Progressing   Problem: Safety: Goal: Ability to remain free from injury will improve Outcome: Progressing   Problem: Skin Integrity: Goal: Risk for impaired skin integrity will decrease Outcome: Progressing   Problem: Education: Goal: Knowledge of disease or condition will improve Outcome: Progressing Goal: Knowledge of the prescribed therapeutic regimen will improve Outcome: Progressing Goal: Individualized Educational Video(s) Outcome: Progressing   Problem: Activity: Goal: Ability to tolerate increased activity will improve Outcome: Progressing Goal: Will verbalize the  importance of balancing activity with adequate rest periods Outcome: Progressing   Problem: Respiratory: Goal: Ability to maintain a clear airway will improve Outcome: Progressing Goal: Levels of oxygenation will improve Outcome: Progressing Goal: Ability to maintain adequate ventilation will improve Outcome: Progressing   Problem: Education: Goal: Knowledge of disease or condition will improve Outcome: Progressing Goal: Knowledge of the prescribed therapeutic regimen will improve Outcome: Progressing Goal: Individualized Educational Video(s) Outcome: Progressing   Problem: Activity: Goal: Ability to tolerate increased activity will improve Outcome: Progressing Goal: Will verbalize the importance of balancing activity with adequate rest periods Outcome: Progressing   Problem: Respiratory: Goal: Ability to maintain a clear airway will improve Outcome: Progressing Goal: Levels of oxygenation will improve Outcome: Progressing Goal: Ability to maintain adequate ventilation will improve Outcome: Progressing

## 2023-12-14 DIAGNOSIS — J9601 Acute respiratory failure with hypoxia: Secondary | ICD-10-CM | POA: Diagnosis not present

## 2023-12-14 MED ORDER — POLYETHYLENE GLYCOL 3350 17 G PO PACK
17.0000 g | PACK | Freq: Two times a day (BID) | ORAL | Status: DC | PRN
  Administered 2023-12-14 – 2023-12-15 (×2): 17 g via ORAL
  Filled 2023-12-14 (×2): qty 1

## 2023-12-14 NOTE — Plan of Care (Signed)
  Problem: Education: Goal: Knowledge of General Education information will improve Description: Including pain rating scale, medication(s)/side effects and non-pharmacologic comfort measures Outcome: Progressing   Problem: Health Behavior/Discharge Planning: Goal: Ability to manage health-related needs will improve Outcome: Progressing   Problem: Clinical Measurements: Goal: Ability to maintain clinical measurements within normal limits will improve Outcome: Progressing Goal: Will remain free from infection Outcome: Progressing Goal: Diagnostic test results will improve Outcome: Progressing Goal: Respiratory complications will improve Outcome: Progressing Goal: Cardiovascular complication will be avoided Outcome: Progressing   Problem: Activity: Goal: Risk for activity intolerance will decrease Outcome: Progressing   Problem: Nutrition: Goal: Adequate nutrition will be maintained Outcome: Progressing   Problem: Coping: Goal: Level of anxiety will decrease Outcome: Progressing   Problem: Elimination: Goal: Will not experience complications related to bowel motility Outcome: Progressing Goal: Will not experience complications related to urinary retention Outcome: Progressing   Problem: Pain Managment: Goal: General experience of comfort will improve and/or be controlled Outcome: Progressing   Problem: Safety: Goal: Ability to remain free from injury will improve Outcome: Progressing   Problem: Skin Integrity: Goal: Risk for impaired skin integrity will decrease Outcome: Progressing   Problem: Education: Goal: Knowledge of disease or condition will improve Outcome: Progressing Goal: Knowledge of the prescribed therapeutic regimen will improve Outcome: Progressing Goal: Individualized Educational Video(s) Outcome: Progressing   Problem: Activity: Goal: Ability to tolerate increased activity will improve Outcome: Progressing Goal: Will verbalize the  importance of balancing activity with adequate rest periods Outcome: Progressing   Problem: Respiratory: Goal: Ability to maintain a clear airway will improve Outcome: Progressing Goal: Levels of oxygenation will improve Outcome: Progressing Goal: Ability to maintain adequate ventilation will improve Outcome: Progressing   Problem: Education: Goal: Knowledge of disease or condition will improve Outcome: Progressing Goal: Knowledge of the prescribed therapeutic regimen will improve Outcome: Progressing Goal: Individualized Educational Video(s) Outcome: Progressing   Problem: Activity: Goal: Ability to tolerate increased activity will improve Outcome: Progressing Goal: Will verbalize the importance of balancing activity with adequate rest periods Outcome: Progressing   Problem: Respiratory: Goal: Ability to maintain a clear airway will improve Outcome: Progressing Goal: Levels of oxygenation will improve Outcome: Progressing Goal: Ability to maintain adequate ventilation will improve Outcome: Progressing

## 2023-12-14 NOTE — Plan of Care (Signed)
  Problem: Education: Goal: Knowledge of General Education information will improve Description: Including pain rating scale, medication(s)/side effects and non-pharmacologic comfort measures Outcome: Progressing   Problem: Health Behavior/Discharge Planning: Goal: Ability to manage health-related needs will improve Outcome: Progressing   Problem: Clinical Measurements: Goal: Ability to maintain clinical measurements within normal limits will improve Outcome: Progressing Goal: Will remain free from infection Outcome: Progressing Goal: Diagnostic test results will improve Outcome: Progressing Goal: Respiratory complications will improve Outcome: Progressing Goal: Cardiovascular complication will be avoided Outcome: Progressing   Problem: Activity: Goal: Risk for activity intolerance will decrease Outcome: Progressing   Problem: Nutrition: Goal: Adequate nutrition will be maintained Outcome: Progressing   Problem: Coping: Goal: Level of anxiety will decrease Outcome: Progressing   Problem: Elimination: Goal: Will not experience complications related to bowel motility Outcome: Progressing Goal: Will not experience complications related to urinary retention Outcome: Progressing   Problem: Pain Managment: Goal: General experience of comfort will improve and/or be controlled Outcome: Progressing   Problem: Safety: Goal: Ability to remain free from injury will improve Outcome: Progressing   Problem: Skin Integrity: Goal: Risk for impaired skin integrity will decrease Outcome: Progressing   Problem: Education: Goal: Knowledge of disease or condition will improve Outcome: Progressing Goal: Knowledge of the prescribed therapeutic regimen will improve Outcome: Progressing   Problem: Activity: Goal: Ability to tolerate increased activity will improve Outcome: Progressing Goal: Will verbalize the importance of balancing activity with adequate rest periods Outcome:  Progressing   Problem: Respiratory: Goal: Ability to maintain a clear airway will improve Outcome: Progressing Goal: Levels of oxygenation will improve Outcome: Progressing Goal: Ability to maintain adequate ventilation will improve Outcome: Progressing   Problem: Education: Goal: Knowledge of disease or condition will improve Outcome: Progressing Goal: Knowledge of the prescribed therapeutic regimen will improve Outcome: Progressing   Problem: Activity: Goal: Ability to tolerate increased activity will improve Outcome: Progressing Goal: Will verbalize the importance of balancing activity with adequate rest periods Outcome: Progressing   Problem: Respiratory: Goal: Ability to maintain a clear airway will improve Outcome: Progressing Goal: Levels of oxygenation will improve Outcome: Progressing Goal: Ability to maintain adequate ventilation will improve Outcome: Progressing   Problem: Education: Goal: Individualized Educational Video(s) Outcome: Not Applicable   Problem: Education: Goal: Individualized Educational Video(s) Outcome: Not Applicable

## 2023-12-14 NOTE — Progress Notes (Signed)
 PROGRESS NOTE    Timothy Eaton  ZOX:[id number] DOB: 01-29-1941 DOA: 12/11/2023 PCP: Helaine Llanos, MD  118A/118A-AA  LOS: 2 days   Brief hospital course:   Assessment & Plan: Timothy Eaton is a 83 y.o. male with medical history significant for  hypertension, COPD,  CAD, atrial fibrillation post op AAA repair 2005, not on AC, mild carotid artery stenosis, BPH,  CKD stage III, being admitted principally for COPD exacerbation with new O2 requirement of 4 L also noted on workup to have finding of left lower lobe nodule and left kidney, enlarged from previous imaging in 2023. He presented from his PCPs office where he presented with dyspnea on exertion and was found to have an O2 sat of 84% on room air improving to 94% on 4 L.    COPD exacerbation (HCC) Acute respiratory failure with hypoxia --CXR no acute finding.  Pt needed 5L O2 with sats around 92-93%.  No O2 requirement at baseline. --s/p IV solumedrol with improvement in his dyspnea Plan: --cont prednisone  --cont DuoNeb scheduled --Continue supplemental O2 to keep sats >=90%, wean as tolerated  Pulmonary nodule 1 cm or greater in diameter - CTA chest shows 14 x 9 mm subpleural nodule is noted in left lower lobe which appears to be enlarged compared to prior exam of 2023.  -Radiology recommendation consider one of the following in 3 months for both low-risk and high-risk individuals:  (a) repeat chest CT,  (b) follow-up PET-CT, or  (c) tissue sampling --outpatient f/u with PCP, discussed with pt and daughter  Complex renal cyst left kidney - Possible complex cyst or mass arising from upper pole of left kidney.  -Further evaluation with MRI is recommended to rule out neoplasm or malignancy. --outpatient f/u with PCP, discussed with pt and daughter   CKD (chronic kidney disease), stage IIIa Renal function at baseline  History of postop paroxysmal atrial fibrillation 2005 [facility]) No recurrent episodes per last cardiology  documentation 10/09/2023 Not on anticoagulation  Carotid artery disease (HCC) Mild, followed by vascular --cont ASA and statin  Coronary artery disease Diffuse calcific disease, nonobstructive on cath 03/2022 --cont ASA and statin  Essential hypertension --on home cardizem , Imdur  and lopressor   --hold home BP meds due to low BP  Hypokalemia --monitor and supplement PRN  Prediabetes Hyperglycemia 2/2 steroid use --monitor BG with am labs   DVT prophylaxis: Lovenox  SQ Code Status: Full code  Family Communication: wife updated at bedside today Level of care: Med-Surg Dispo:   The patient is from: home Anticipated d/c is to: home Anticipated d/c date is: 1-2 days   Subjective and Interval History:  Pt denied dyspnea.     Objective: Vitals:   12/13/23 2019 12/14/23 0325 12/14/23 0807 12/14/23 1544  BP:  120/72 128/70 111/62  Pulse:  74 81 93  Resp:    20  Temp:  97.8 F (36.6 C) (!) 97.4 F (36.3 C) 97.8 F (36.6 C)  TempSrc:    Oral  SpO2: 90% 93% (!) 89% (!) 89%  Weight:      Height:        Intake/Output Summary (Last 24 hours) at 12/14/2023 1703 Last data filed at 12/14/2023 1427 Gross per 24 hour  Intake 360 ml  Output 350 ml  Net 10 ml   Filed Weights   12/11/23 1334 12/12/23 0222  Weight: 61 kg 61 kg    Examination:   Constitutional: NAD, AAOx3 HEENT: conjunctivae and lids normal, EOMI CV: No cyanosis.  RESP: normal respiratory effort, on 2L sating 93% Neuro: II - XII grossly intact.   Psych: Normal mood and affect.  Appropriate judgement and reason   Data Reviewed: I have personally reviewed labs and imaging studies  Time spent: 50 minutes  Garrison Kanner, MD [facility] If 7PM-7AM, please contact night-coverage 12/14/2023, 5:03 PM

## 2023-12-14 NOTE — Progress Notes (Signed)
 Mobility Specialist - Progress Note   12/14/23 0954  Mobility  Activity Ambulated with assistance in hallway;Stood at bedside;Dangled on edge of bed  Level of Assistance Standby assist, set-up cues, supervision of patient - no hands on  Assistive Device Front wheel walker  Distance Ambulated (ft) 500 ft  Activity Response Tolerated well  Mobility Referral Yes  Mobility visit 1 Mobility  Mobility Specialist Start Time (ACUTE ONLY) 0932  Mobility Specialist Stop Time (ACUTE ONLY) 0949  Mobility Specialist Time Calculation (min) (ACUTE ONLY) 17 min   Pt supine in bed on RA upon arrival. Pt STS and ambulates 3 laps in hallway SBA with no LOB noted. Pt returns to EOB with needs in reach and bed alarm activated.   Wash Hack  Mobility Specialist  12/14/23 9:55 AM

## 2023-12-15 DIAGNOSIS — J9601 Acute respiratory failure with hypoxia: Secondary | ICD-10-CM | POA: Diagnosis not present

## 2023-12-15 NOTE — Plan of Care (Signed)
  Problem: Education: Goal: Knowledge of General Education information will improve Description: Including pain rating scale, medication(s)/side effects and non-pharmacologic comfort measures Outcome: Progressing   Problem: Health Behavior/Discharge Planning: Goal: Ability to manage health-related needs will improve Outcome: Progressing   Problem: Clinical Measurements: Goal: Ability to maintain clinical measurements within normal limits will improve Outcome: Progressing Goal: Will remain free from infection Outcome: Progressing Goal: Diagnostic test results will improve Outcome: Progressing Goal: Respiratory complications will improve Outcome: Progressing Goal: Cardiovascular complication will be avoided Outcome: Progressing   Problem: Activity: Goal: Risk for activity intolerance will decrease Outcome: Progressing   Problem: Nutrition: Goal: Adequate nutrition will be maintained Outcome: Progressing   Problem: Coping: Goal: Level of anxiety will decrease Outcome: Progressing   Problem: Elimination: Goal: Will not experience complications related to bowel motility Outcome: Progressing Goal: Will not experience complications related to urinary retention Outcome: Progressing   Problem: Pain Managment: Goal: General experience of comfort will improve and/or be controlled Outcome: Progressing   Problem: Safety: Goal: Ability to remain free from injury will improve Outcome: Progressing   Problem: Skin Integrity: Goal: Risk for impaired skin integrity will decrease Outcome: Progressing   Problem: Education: Goal: Knowledge of disease or condition will improve Outcome: Progressing Goal: Knowledge of the prescribed therapeutic regimen will improve Outcome: Progressing   Problem: Activity: Goal: Ability to tolerate increased activity will improve Outcome: Progressing Goal: Will verbalize the importance of balancing activity with adequate rest periods Outcome:  Progressing   Problem: Respiratory: Goal: Ability to maintain a clear airway will improve Outcome: Progressing Goal: Levels of oxygenation will improve Outcome: Progressing Goal: Ability to maintain adequate ventilation will improve Outcome: Progressing   Problem: Education: Goal: Knowledge of disease or condition will improve Outcome: Progressing Goal: Knowledge of the prescribed therapeutic regimen will improve Outcome: Progressing   Problem: Activity: Goal: Ability to tolerate increased activity will improve Outcome: Progressing Goal: Will verbalize the importance of balancing activity with adequate rest periods Outcome: Progressing   Problem: Respiratory: Goal: Ability to maintain a clear airway will improve Outcome: Progressing Goal: Levels of oxygenation will improve Outcome: Progressing Goal: Ability to maintain adequate ventilation will improve Outcome: Progressing

## 2023-12-15 NOTE — Care Management Important Message (Signed)
 Important Message  Patient Details  Name: Timothy Eaton MRN: [id number] Date of Birth: 1941-04-29   Important Message Given:  Yes - Medicare IM     Anise Kerns 12/15/2023, 2:01 PM

## 2023-12-15 NOTE — Progress Notes (Signed)
 PROGRESS NOTE    [name]  ZOX:[id number] DOB: 04/28/1941 DOA: 12/11/2023 PCP: Helaine Llanos, MD  118A/118A-AA  LOS: 3 days   Brief hospital course:   Assessment & Plan: Timothy Eaton is a 83 y.o. male with medical history significant for  hypertension, COPD,  CAD, atrial fibrillation post op AAA repair 2005, not on AC, mild carotid artery stenosis, BPH,  CKD stage III, being admitted principally for COPD exacerbation with new O2 requirement of 4 L also noted on workup to have finding of left lower lobe nodule and left kidney, enlarged from previous imaging in 2023. He presented from his PCPs office where he presented with dyspnea on exertion and was found to have an O2 sat of 84% on room air improving to 94% on 4 L.    COPD exacerbation (HCC) Acute respiratory failure with hypoxia --CXR no acute finding.  Pt needed 5L O2 with sats around 92-93%.  No O2 requirement at baseline. --s/p IV solumedrol with improvement in his dyspnea --currently needs 4L with walking to maintain 88% Plan: --cont prednisone  --cont DuoNeb scheduled --Continue supplemental O2 to keep sats >=90%, wean as tolerated  Pulmonary nodule 1 cm or greater in diameter - CTA chest shows 14 x 9 mm subpleural nodule is noted in left lower lobe which appears to be enlarged compared to prior exam of 2023.  -Radiology recommendation consider one of the following in 3 months for both low-risk and high-risk individuals:  (a) repeat chest CT,  (b) follow-up PET-CT, or  (c) tissue sampling --outpatient f/u with PCP, discussed with pt and daughter  Complex renal cyst left kidney - Possible complex cyst or mass arising from upper pole of left kidney.  -Further evaluation with MRI is recommended to rule out neoplasm or malignancy. --outpatient f/u with PCP, discussed with pt and daughter   CKD (chronic kidney disease), stage IIIa Renal function at baseline  History of postop paroxysmal atrial fibrillation 2005  [facility]) No recurrent episodes per last cardiology documentation 10/09/2023 Not on anticoagulation  Carotid artery disease (HCC) Mild, followed by vascular --cont ASA and statin  Coronary artery disease Diffuse calcific disease, nonobstructive on cath 03/2022 --cont ASA and statin  Essential hypertension --on home cardizem , Imdur  and lopressor   --hold home BP for now due to intermittent low BP  Hypokalemia --monitor and supplement PRN  Prediabetes Hyperglycemia 2/2 steroid use --monitor BG with am labs   DVT prophylaxis: Lovenox  SQ Code Status: Full code  Family Communication:  Level of care: Med-Surg Dispo:   The patient is from: home Anticipated d/c is to: home Anticipated d/c date is: tomorrow   Subjective and Interval History:  Ambulation test this morning showed O2 sats 88% while on 4L O2.  Pt denied dyspnea.   Objective: Vitals:   12/15/23 0648 12/15/23 0702 12/15/23 0800 12/15/23 1542  BP:   (!) 148/83 125/63  Pulse:   69 82  Resp:   16 18  Temp:   98.2 F (36.8 C) 97.9 F (36.6 C)  TempSrc:    Oral  SpO2: (!) 89% 93% 92% 90%  Weight:      Height:        Intake/Output Summary (Last 24 hours) at 12/15/2023 1845 Last data filed at 12/15/2023 1500 Gross per 24 hour  Intake 360 ml  Output 800 ml  Net -440 ml   Filed Weights   12/11/23 1334 12/12/23 0222  Weight: 61 kg 61 kg    Examination:   Constitutional:  NAD, AAOx3 HEENT: conjunctivae and lids normal, EOMI CV: No cyanosis.   RESP: normal respiratory effort Neuro: II - XII grossly intact.   Psych: Normal mood and affect.  Appropriate judgement and reason   Data Reviewed: I have personally reviewed labs and imaging studies  Time spent: 35 minutes  Garrison Kanner, MD [facility] If 7PM-7AM, please contact night-coverage 12/15/2023, 6:45 PM

## 2023-12-15 NOTE — Plan of Care (Signed)
   Problem: Nutrition: Goal: Adequate nutrition will be maintained Outcome: Progressing   Problem: Coping: Goal: Level of anxiety will decrease Outcome: Progressing   Problem: Pain Managment: Goal: General experience of comfort will improve and/or be controlled Outcome: Progressing

## 2023-12-15 NOTE — Progress Notes (Signed)
 SATURATION QUALIFICATIONS: (This note is used to comply with regulatory documentation for home oxygen)  Patient Saturations on Room Air at Rest = 85%  Patient Saturations on Room Air while Ambulating = 76%  Patient Saturations on 2 Liters of oxygen while Ambulating = 82%; on 4 Liters of oxygen while ambulating = 88%  Please briefly explain why patient needs home oxygen:

## 2023-12-16 ENCOUNTER — Other Ambulatory Visit: Payer: Self-pay

## 2023-12-16 MED ORDER — HYDROCHLOROTHIAZIDE 12.5 MG PO TABS
12.5000 mg | ORAL_TABLET | Freq: Every day | ORAL | Status: DC
  Administered 2023-12-16: 12.5 mg via ORAL
  Filled 2023-12-16: qty 1

## 2023-12-16 MED ORDER — ALBUTEROL SULFATE HFA 108 (90 BASE) MCG/ACT IN AERS
2.0000 | INHALATION_SPRAY | Freq: Four times a day (QID) | RESPIRATORY_TRACT | 0 refills | Status: AC | PRN
  Filled 2023-12-16: qty 6.7, 30d supply, fill #0

## 2023-12-16 MED ORDER — UMECLIDINIUM-VILANTEROL 62.5-25 MCG/ACT IN AEPB
1.0000 | INHALATION_SPRAY | Freq: Every day | RESPIRATORY_TRACT | 2 refills | Status: DC
  Filled 2023-12-16: qty 60, 30d supply, fill #0

## 2023-12-16 MED ORDER — ENSURE ENLIVE PO LIQD: 237.0000 mL | Freq: Two times a day (BID) | ORAL | Status: DC

## 2023-12-16 MED ORDER — ISOSORBIDE MONONITRATE ER 30 MG PO TB24
30.0000 mg | ORAL_TABLET | Freq: Every day | ORAL | Status: DC
  Administered 2023-12-16: 30 mg via ORAL
  Filled 2023-12-16: qty 1

## 2023-12-16 MED ORDER — BREZTRI AEROSPHERE 160-9-4.8 MCG/ACT IN AERO
2.0000 | INHALATION_SPRAY | Freq: Two times a day (BID) | RESPIRATORY_TRACT | 0 refills | Status: DC
  Filled 2023-12-16: qty 10.7, 30d supply, fill #0

## 2023-12-16 NOTE — Plan of Care (Signed)
   Problem: Education: Goal: Knowledge of General Education information will improve Description Including pain rating scale, medication(s)/side effects and non-pharmacologic comfort measures Outcome: Progressing   Problem: Health Behavior/Discharge Planning: Goal: Ability to manage health-related needs will improve Outcome: Progressing

## 2023-12-16 NOTE — Discharge Summary (Signed)
 Physician Discharge Summary   Timothy Eaton  adult DOB: 26-Sep-1940  UJW:[id number]  PCP: Helaine Llanos, MD  Admit date: 12/11/2023 Discharge date: 12/16/2023  Admitted From: home Disposition:  home Home Health: Yes CODE STATUS: Full code  Discharge Instructions     Discharge instructions   Complete by: As directed    You are discharged on 4 liters of oxygen.  You need it while walking.  Please follow up with your primary care doctor to see when you can get off of supplemental oxygen.  I have prescribed you inhaler Anoro to control your COPD.  Your heart rate has been low.  I have stopped your home Cardizem  and held your Lopressor  pending followup with your primary care doctor. [facility] Course:  For full details, please see H&P, progress notes, consult notes and ancillary notes.  Briefly,  Timothy Eaton is a 83 y.o. male with medical history significant for hypertension, COPD, CAD, atrial fibrillation not on AC, CKD stage III, being admitted principally for COPD exacerbation with new O2 requirement of 4 L also noted on workup to have finding of left lower lobe nodule and left kidney, enlarged from previous imaging in 2023.  He presented from his PCPs office where he presented with dyspnea on exertion and was found to have an O2 sat of 84% on room air improving to 94% on 4 L.    COPD exacerbation (HCC) Acute respiratory failure with hypoxia --CXR no acute finding.  Pt needed 5L O2 with sats around 92-93%.  No O2 requirement at baseline. --s/p IV solumedrol with improvement in his dyspnea, f/b prednisone  40 mg daily for 4 days.  Received DuoNeb scheduled. --prior to discharge, pt was sating 96% on room air while at rest, but needed 4L O2 while ambulating.  Pt was discharged on 4L O2. --started on Anoro    Pulmonary nodule 1 cm or greater in diameter - CTA chest shows 14 x 9 mm subpleural nodule is noted in left lower lobe which appears to be enlarged compared to  prior exam of 2023.  -Radiology recommendation consider one of the following in 3 months for both low-risk and high-risk individuals:  (a) repeat chest CT,  (b) follow-up PET-CT, or  (c) tissue sampling --outpatient f/u with PCP, discussed with pt and daughter   Complex renal cyst left kidney - Possible complex cyst or mass arising from upper pole of left kidney.  -Further evaluation with MRI is recommended to rule out neoplasm or malignancy. --outpatient f/u with PCP, discussed with pt and daughter   CKD (chronic kidney disease), stage IIIa Renal function at baseline   History of postop paroxysmal atrial fibrillation [facility]) No recurrent episodes per last cardiology documentation 10/09/2023 Not on anticoagulation --hold home Lopressor  due to intermittent low HR. --d/c'ed home Cardizem .   Carotid artery disease (HCC) Mild, followed by vascular --cont ASA and statin   Coronary artery disease Diffuse calcific disease, nonobstructive on cath 03/2022 --cont ASA and statin   Essential hypertension --hold home Lopressor  due to intermittent low HR. --d/c'ed home Cardizem . --cont home Imdur  and HCTZ   Hypokalemia --monitored and supplemented PRN --cont home potassium 20 mEq daily   Prediabetes Hyperglycemia 2/2 steroid use   Discharge Diagnoses:  Principal Problem:   Acute respiratory failure with hypoxia (HCC) Active Problems:   COPD exacerbation (HCC)   Pulmonary nodule 1 cm or greater in diameter   Complex renal cyst left kidney  Essential hypertension   Coronary artery disease   Carotid artery disease (HCC)   History of postop paroxysmal atrial fibrillation 2005 (HCC)   CKD (chronic kidney disease), stage IIIa   Acute hypoxemic respiratory failure (HCC)   30 Day Unplanned Readmission Risk Score    Flowsheet Row ED to Hosp-Admission (Current) from 12/11/2023 in [facility] 1C MEDICAL TELEMETRY  30 Day Unplanned Readmission Risk Score (%)  11.74 Filed at 12/16/2023 0801       This score is the patient's risk of an unplanned readmission within 30 days of being discharged (0 -100%). The score is based on dignosis, age, lab data, medications, orders, and past utilization.   Low:  0-14.9   Medium: 15-21.9   High: 22-29.9   Extreme: 30 and above         Discharge Instructions:  Allergies as of 12/16/2023       Reactions   Sulfamethoxazole-trimethoprim Rash        Medication List     PAUSE taking these medications    metoprolol  tartrate 25 MG tablet Wait to take this until your doctor or other care provider tells you to start again. Due to intermittent low heart rate. Commonly known as: LOPRESSOR  Take 1 tablet (25 mg total) by mouth 2 (two) times daily.       STOP taking these medications    diltiazem  240 MG 24 hr capsule Commonly known as: CARDIZEM  CD       TAKE these medications    albuterol  108 (90 Base) MCG/ACT inhaler Commonly known as: VENTOLIN  HFA Inhale 2 puffs into the lungs every 6 (six) hours as needed for wheezing or shortness of breath.   aspirin  EC 81 MG tablet Take 81 mg by mouth daily.   cyanocobalamin  1000 MCG tablet Commonly known as: VITAMIN B12 Take 1,000 mcg by mouth daily.   feeding supplement Liqd Take 237 mLs by mouth 2 (two) times daily between meals.   hydrochlorothiazide  12.5 MG tablet Commonly known as: HYDRODIURIL  Take 1 tablet (12.5 mg total) by mouth daily.   isosorbide  mononitrate 30 MG 24 hr tablet Commonly known as: IMDUR  Take 1 tablet (30 mg total) by mouth daily.   mometasone 0.1 % ointment Commonly known as: ELOCON Apply topically 2 (two) times daily.   nitroGLYCERIN  0.4 MG SL tablet Commonly known as: NITROSTAT  Place 1 tablet (0.4 mg total) under the tongue every 5 (five) minutes as needed (for chest pain or severe dyspnea).   potassium chloride  SA 20 MEQ tablet Commonly known as: KLOR-CON  M TAKE 1 TABLET(20 MEQ) BY MOUTH DAILY   rosuvastatin   20 MG tablet Commonly known as: CRESTOR  Take 1 tablet (20 mg total) by mouth daily.   tamsulosin  0.4 MG Caps capsule Commonly known as: FLOMAX  TAKE 1 CAPSULE(0.4 MG) BY MOUTH DAILY   umeclidinium-vilanterol 62.5-25 MCG/ACT Aepb Commonly known as: ANORO ELLIPTA Inhale 1 puff into the lungs daily.               Durable Medical Equipment  (From admission, onward)           Start     Ordered   12/16/23 0944  For home use only DME oxygen  Once       Question Answer Comment  Length of Need 6 Months   Mode or (Route) Nasal cannula   Liters per Minute 4   Frequency Continuous (stationary and portable oxygen unit needed)   Oxygen delivery system Gas  12/16/23 4401             Follow-up Information     Helaine Llanos, MD. Go on 12/23/2023.   Specialties: Internal Medicine, Pediatrics Why: @11 :00am Contact information: [address] [place] [address] [phone]                 Allergies  Allergen Reactions   Sulfamethoxazole-Trimethoprim Rash     The results of significant diagnostics from this hospitalization (including imaging, microbiology, ancillary and laboratory) are listed below for reference.   Consultations:   Procedures/Studies: CT Angio Chest PE W and/or Wo Contrast Result Date: 12/11/2023 CLINICAL DATA:  Chest pain.  Dyspnea with exertion. EXAM: CT ANGIOGRAPHY CHEST WITH CONTRAST TECHNIQUE: Multidetector CT imaging of the chest was performed using the standard protocol during bolus administration of intravenous contrast. Multiplanar CT image reconstructions and MIPs were obtained to evaluate the vascular anatomy. RADIATION DOSE REDUCTION: This exam was performed according to the departmental dose-optimization program which includes automated exposure control, adjustment of the mA and/or kV according to patient size and/or use of iterative reconstruction technique. CONTRAST:  75mL OMNIPAQUE  IOHEXOL  350 MG/ML SOLN  COMPARISON:  Dec 08, 2021.  April 19, 2011. FINDINGS: Cardiovascular: Satisfactory opacification of the pulmonary arteries to the segmental level. No evidence of pulmonary embolism. Normal heart size. No pericardial effusion. Coronary artery calcifications are noted. Mediastinum/Nodes: No enlarged mediastinal, hilar, or axillary lymph nodes. Thyroid  gland, trachea, and esophagus demonstrate no significant findings. Lungs/Pleura: No pneumothorax or pleural effusion is noted. Severe emphysematous disease is noted. Hyperexpansion of the lungs is noted. Minimal bilateral posterior basilar subsegmental atelectasis or scarring is noted. 14 x 9 mm subpleural nodule is noted in left lower lobe best seen on image number 121 of series 5, which is enlarged compared to prior exam and concerning for malignancy. Upper Abdomen: Possible complex cyst or mass arising from upper pole of left kidney. Musculoskeletal: No chest wall abnormality. No acute or significant osseous findings. Review of the MIP images confirms the above findings. IMPRESSION: 14 x 9 mm subpleural nodule is noted in left lower lobe which appears to be enlarged compared to prior exam of 2023. Consider one of the following in 3 months for both low-risk and high-risk individuals: (a) repeat chest CT, (b) follow-up PET-CT, or (c) tissue sampling. This recommendation follows the consensus statement: Guidelines for Management of Incidental Pulmonary Nodules Detected on CT Images: From the Fleischner Society 2017; Radiology 2017; 284:228-243. Possible complex cyst or mass arising from upper pole of left kidney. Further evaluation with MRI is recommended to rule out neoplasm or malignancy. No definite evidence of pulmonary embolus. Minimal bilateral posterior basilar subsegmental atelectasis or scarring is noted. Coronary artery calcifications are noted suggesting coronary disease. Severe emphysematous disease is noted. Electronically Signed   By: Rosalene Colon  M.D.   On: 12/11/2023 19:05   DG Chest 2 View Result Date: 12/11/2023 CLINICAL DATA:  Shortness of breath. EXAM: CHEST - 2 VIEW COMPARISON:  06/29/2019. FINDINGS: Bilateral lungs appear hyperlucent with coarse bronchovascular markings, in keeping with COPD. There are extensive bullous changes asymmetrically involving the right upper mid lung zones. Bilateral lungs otherwise appear clear. No dense consolidation or lung collapse. Bilateral costophrenic angles are clear. Stable cardio-mediastinal silhouette. No acute osseous abnormalities. The soft tissues are within normal limits. IMPRESSION: *No active cardiopulmonary disease. COPD. Electronically Signed   By: Beula Brunswick M.D.   On: 12/11/2023 14:45      Labs: BNP (last  3 results) Recent Labs    12/11/23 1341  BNP 128.4*   Basic Metabolic Panel: Recent Labs  Lab 12/11/23 1341 12/12/23 0438 12/13/23 0449  NA 142 143 141  K 3.1* 3.4* 4.1  CL 103 102 103  CO2 30 28 28   GLUCOSE 106* 262* 176*  BUN 27* 33* 38*  CREATININE 1.40* 1.52* 1.42*  CALCIUM  8.6* 8.8* 8.9  MG  --   --  2.5*   Liver Function Tests: No results for input(s): "AST", "ALT", "ALKPHOS", "BILITOT", "PROT", "ALBUMIN" in the last 168 hours. No results for input(s): "LIPASE", "AMYLASE" in the last 168 hours. No results for input(s): "AMMONIA" in the last 168 hours. CBC: Recent Labs  Lab 12/11/23 1341 12/13/23 0449  WBC 4.3 6.4  HGB 14.7 12.2*  HCT 45.5 36.9*  MCV 94.8 93.2  PLT 146* 119*   Cardiac Enzymes: No results for input(s): "CKTOTAL", "CKMB", "CKMBINDEX", "TROPONINI" in the last 168 hours. BNP: Invalid input(s): "POCBNP" CBG: No results for input(s): "GLUCAP" in the last 168 hours. D-Dimer No results for input(s): "DDIMER" in the last 72 hours. Hgb A1c No results for input(s): "HGBA1C" in the last 72 hours. Lipid Profile No results for input(s): "CHOL", "HDL", "LDLCALC", "TRIG", "CHOLHDL", "LDLDIRECT" in the last 72 hours. Thyroid  function  studies No results for input(s): "TSH", "T4TOTAL", "T3FREE", "THYROIDAB" in the last 72 hours.  Invalid input(s): "FREET3" Anemia work up No results for input(s): "VITAMINB12", "FOLATE", "FERRITIN", "TIBC", "IRON", "RETICCTPCT" in the last 72 hours. Urinalysis    Component Value Date/Time   COLORURINE YELLOW (A) 01/09/2020 1053   APPEARANCEUR CLOUDY (A) 01/09/2020 1053   APPEARANCEUR Cloudy (A) 12/17/2019 1329   LABSPEC 1.013 01/09/2020 1053   PHURINE 7.0 01/09/2020 1053   GLUCOSEU NEGATIVE 01/09/2020 1053   HGBUR LARGE (A) 01/09/2020 1053   BILIRUBINUR pos 10/16/2023 1231   BILIRUBINUR Negative 12/17/2019 1329   KETONESUR 5 (A) 01/09/2020 1053   PROTEINUR Positive (A) 10/16/2023 1231   PROTEINUR 30 (A) 01/09/2020 1053   UROBILINOGEN 1.0 10/16/2023 1231   NITRITE neg 10/16/2023 1231   NITRITE POSITIVE (A) 01/09/2020 1053   LEUKOCYTESUR Negative 10/16/2023 1231   LEUKOCYTESUR LARGE (A) 01/09/2020 1053   Sepsis Labs Recent Labs  Lab 12/11/23 1341 12/13/23 0449  WBC 4.3 6.4   Microbiology Recent Results (from the past 240 hours)  Resp panel by RT-PCR (RSV, Flu A&B, Covid) Anterior Nasal Swab     Status: None   Collection Time: 12/12/23 12:19 PM   Specimen: Anterior Nasal Swab  Result Value Ref Range Status   SARS Coronavirus 2 by RT PCR NEGATIVE NEGATIVE Final    Comment: (NOTE) SARS-CoV-2 target nucleic acids are NOT DETECTED.  The SARS-CoV-2 RNA is generally detectable in upper respiratory specimens during the acute phase of infection. The lowest concentration of SARS-CoV-2 viral copies this assay can detect is 138 copies/mL. A negative result does not preclude SARS-Cov-2 infection and should not be used as the sole basis for treatment or other patient management decisions. A negative result may occur with  improper specimen collection/handling, submission of specimen other than nasopharyngeal swab, presence of viral mutation(s) within the areas targeted by this  assay, and inadequate number of viral copies(<138 copies/mL). A negative result must be combined with clinical observations, patient history, and epidemiological information. The expected result is Negative.  Fact Sheet for Patients:  BloggerCourse.com  Fact Sheet for Healthcare Providers:  SeriousBroker.it  This test is no t yet approved or cleared by the [place]   FDA and  has been authorized for detection and/or diagnosis of SARS-CoV-2 by FDA under an Emergency Use Authorization (EUA). This EUA will remain  in effect (meaning this test can be used) for the duration of the COVID-19 declaration under Section 564(b)(1) of the Act, 21 U.S.C.section 360bbb-3(b)(1), unless the authorization is terminated  or revoked sooner.       Influenza A by PCR NEGATIVE NEGATIVE Final   Influenza B by PCR NEGATIVE NEGATIVE Final    Comment: (NOTE) The Xpert Xpress SARS-CoV-2/FLU/RSV plus assay is intended as an aid in the diagnosis of influenza from Nasopharyngeal swab specimens and should not be used as a sole basis for treatment. Nasal washings and aspirates are unacceptable for Xpert Xpress SARS-CoV-2/FLU/RSV testing.  Fact Sheet for Patients: BloggerCourse.com  Fact Sheet for Healthcare Providers: SeriousBroker.it  This test is not yet approved or cleared by the [place]  FDA and has been authorized for detection and/or diagnosis of SARS-CoV-2 by FDA under an Emergency Use Authorization (EUA). This EUA will remain in effect (meaning this test can be used) for the duration of the COVID-19 declaration under Section 564(b)(1) of the Act, 21 U.S.C. section 360bbb-3(b)(1), unless the authorization is terminated or revoked.     Resp Syncytial Virus by PCR NEGATIVE NEGATIVE Final    Comment: (NOTE) Fact Sheet for Patients: BloggerCourse.com  Fact Sheet for  Healthcare Providers: SeriousBroker.it  This test is not yet approved or cleared by the [place]  FDA and has been authorized for detection and/or diagnosis of SARS-CoV-2 by FDA under an Emergency Use Authorization (EUA). This EUA will remain in effect (meaning this test can be used) for the duration of the COVID-19 declaration under Section 564(b)(1) of the Act, 21 U.S.C. section 360bbb-3(b)(1), unless the authorization is terminated or revoked.  Performed at [facility], [address] [place], [place] [address]      Total time spend on discharging this patient, including the last patient exam, discussing the hospital stay, instructions for ongoing care as it relates to all pertinent caregivers, as well as preparing the medical discharge records, prescriptions, and/or referrals as applicable, is 35 minutes.    Garrison Kanner, MD  [facility] 12/16/2023, 9:56 AM

## 2023-12-16 NOTE — Progress Notes (Signed)
 SATURATION QUALIFICATIONS: (This note is used to comply with regulatory documentation for home oxygen)  Patient Saturations on Room Air at Rest = 96%  Patient Saturations on Room Air while Ambulating = 80%  Patient Saturations on 4 Liters of oxygen while Ambulating = 90%  Please briefly explain why patient needs home oxygen: Pt needs oxygen for ambulation/exertion needs. He is 95% or above at rest but drops into the 80s when ambulating/exerting.

## 2023-12-17 ENCOUNTER — Telehealth: Payer: Self-pay

## 2023-12-17 NOTE — Transitions of Care (Post Inpatient/ED Visit) (Signed)
 12/17/2023  Name: Timothy Eaton MRN: [id number] DOB: 12-18-1940  Today's TOC FU Call Status: Today's TOC FU Call Status:: Successful TOC FU Call Completed TOC FU Call Complete Date: 12/17/23 Patient's Name and Date of Birth confirmed.  Transition Care Management Follow-up Telephone Call Wife Timothy Eaton is caregiver patient unable  Date of Discharge: 12/16/23 Discharge Facility: [facility]) Type of Discharge: Inpatient Admission Primary Inpatient Discharge Diagnosis:: Respiratory Failure with home oxygen How have you been since you were released from the hospital?: Same  Items Reviewed: Did you receive and understand the discharge instructions provided?: Yes  Medications Reviewed Today: Medications Reviewed Today     Reviewed by Jamie Mccoy, RN (Registered Nurse) on 12/17/23 at 1211  Med List Status: <None>   Medication Order Taking? Sig Documenting Provider Last Dose Status Informant  albuterol  (VENTOLIN  HFA) 108 (90 Base) MCG/ACT inhaler [id number] Yes Inhale 2 puffs into the lungs every 6 (six) hours as needed for wheezing or shortness of breath. Garrison Kanner, MD Taking Active            Med Note Miriam American, Lenard Quam Dec 17, 2023 12:06 PM) Using as needed  aspirin  81 MG EC tablet [id number] Yes Take 81 mg by mouth daily.   [provider] Taking Active Spouse/Significant Other  cyanocobalamin  (VITAMIN B12) 1000 MCG tablet [id number] Yes Take 1,000 mcg by mouth daily. [provider] Taking Active Spouse/Significant Other  feeding supplement (ENSURE ENLIVE / ENSURE PLUS) LIQD [id number] Yes Take 237 mLs by mouth 2 (two) times daily between meals. Garrison Kanner, MD Taking Active   hydrochlorothiazide  (HYDRODIURIL ) 12.5 MG tablet [id number] Yes Take 1 tablet (12.5 mg total) by mouth daily. Nahser, Lela Purple, MD Taking Active Spouse/Significant Other  isosorbide  mononitrate (IMDUR ) 30 MG 24 hr tablet [id number] Yes Take 1 tablet (30 mg total) by  mouth daily. Nahser, Lela Purple, MD Taking Active Spouse/Significant Other  metoprolol  tartrate (LOPRESSOR ) 25 MG tablet [id number] No Take 1 tablet (25 mg total) by mouth 2 (two) times daily.  Patient not taking: Reported on 12/17/2023   Nahser, Lela Purple, MD Not Taking Active Spouse/Significant Other  mometasone (ELOCON) 0.1 % ointment [id number] No Apply topically 2 (two) times daily.  Patient not taking: Reported on 12/17/2023   [provider] Not Taking Active Spouse/Significant Other           Med Note Miriam American, Lenard Quam Dec 17, 2023 12:09 PM) Skin issues healed up  nitroGLYCERIN  (NITROSTAT ) 0.4 MG SL tablet [id number] Yes Place 1 tablet (0.4 mg total) under the tongue every 5 (five) minutes as needed (for chest pain or severe dyspnea). Nahser, Lela Purple, MD Taking Active Spouse/Significant Other           Med Note Miriam American, Lenard Quam Dec 17, 2023 12:10 PM) As needed  potassium chloride  SA (KLOR-CON  M) 20 MEQ tablet [id number] Yes TAKE 1 TABLET(20 MEQ) BY MOUTH DAILY Letvak, Richard I, MD Taking Active Spouse/Significant Other  rosuvastatin  (CRESTOR ) 20 MG tablet [id number] Yes Take 1 tablet (20 mg total) by mouth daily. Nahser, Lela Purple, MD Taking Active Spouse/Significant Other  tamsulosin  (FLOMAX ) 0.4 MG CAPS capsule [id number] Yes TAKE 1 CAPSULE(0.4 MG) BY MOUTH DAILY Letvak, Richard I, MD Taking Active Spouse/Significant Other  umeclidinium-vilanterol (ANORO ELLIPTA) 62.5-25 MCG/ACT AEPB [id number] Yes Inhale 1 puff into the lungs daily. Garrison Kanner, MD Taking Active  Home Care and Equipment/Supplies: Patient's wife awaiting oxygen provider for delivery, he has oxygen at home and she will call them back.       Follow up appointments reviewed: PCP follow up Monday, to talk about medication change per wife    SDOH Interventions Today    Flowsheet Row Most Recent Value  SDOH Interventions   Food Insecurity Interventions Intervention Not Indicated   Housing Interventions Intervention Not Indicated  Transportation Interventions Intervention Not Indicated  Utilities Interventions Intervention Not Indicated      [place], RN, BSN, CCM [place]  [facility], [facility] RN Care Manager Direct Dial: [phone]

## 2023-12-23 ENCOUNTER — Encounter: Payer: Self-pay | Admitting: Internal Medicine

## 2023-12-23 ENCOUNTER — Ambulatory Visit (INDEPENDENT_AMBULATORY_CARE_PROVIDER_SITE_OTHER): Admitting: Internal Medicine

## 2023-12-23 ENCOUNTER — Ambulatory Visit: Payer: Self-pay | Admitting: Internal Medicine

## 2023-12-23 VITALS — BP 130/80 | HR 72

## 2023-12-23 DIAGNOSIS — N1831 Chronic kidney disease, stage 3a: Secondary | ICD-10-CM

## 2023-12-23 DIAGNOSIS — J9611 Chronic respiratory failure with hypoxia: Secondary | ICD-10-CM

## 2023-12-23 DIAGNOSIS — N2889 Other specified disorders of kidney and ureter: Secondary | ICD-10-CM | POA: Diagnosis not present

## 2023-12-23 DIAGNOSIS — R7309 Other abnormal glucose: Secondary | ICD-10-CM | POA: Diagnosis not present

## 2023-12-23 DIAGNOSIS — J439 Emphysema, unspecified: Secondary | ICD-10-CM | POA: Diagnosis not present

## 2023-12-23 DIAGNOSIS — N281 Cyst of kidney, acquired: Secondary | ICD-10-CM

## 2023-12-23 LAB — CBC
HCT: 46.6 % (ref 39.0–52.0)
Hemoglobin: 15.4 g/dL (ref 13.0–17.0)
MCHC: 33 g/dL (ref 30.0–36.0)
MCV: 92.7 fl (ref 78.0–100.0)
Platelets: 132 10*3/uL — ABNORMAL LOW (ref 150.0–400.0)
RBC: 5.03 Mil/uL (ref 4.22–5.81)
RDW: 16.6 % — ABNORMAL HIGH (ref 11.5–15.5)
WBC: 4.5 10*3/uL (ref 4.0–10.5)

## 2023-12-23 LAB — RENAL FUNCTION PANEL
Albumin: 3.5 g/dL (ref 3.5–5.2)
BUN: 27 mg/dL — ABNORMAL HIGH (ref 6–23)
CO2: 30 meq/L (ref 19–32)
Calcium: 8.6 mg/dL (ref 8.4–10.5)
Chloride: 109 meq/L (ref 96–112)
Creatinine, Ser: 1.23 mg/dL (ref 0.40–1.50)
GFR: 54.01 mL/min — ABNORMAL LOW (ref >60.00)
Glucose, Bld: 87 mg/dL (ref 70–99)
Phosphorus: 2.8 mg/dL (ref 2.3–4.6)
Potassium: 4.1 meq/L (ref 3.5–5.1)
Sodium: 144 meq/L (ref 135–145)

## 2023-12-23 NOTE — Assessment & Plan Note (Signed)
 Has oxygen but reluctant to wear it Urged him to wear at night and for any exertion Wife can monitor his oximetry at home

## 2023-12-23 NOTE — Assessment & Plan Note (Signed)
 Will recheck labs--likely up due to steroids

## 2023-12-23 NOTE — Assessment & Plan Note (Signed)
 Has been stable for some years

## 2023-12-23 NOTE — Assessment & Plan Note (Signed)
 Will check MRI per radiologist's recommendations

## 2023-12-23 NOTE — Assessment & Plan Note (Signed)
 Now stable on anoro daily Will set up with pulmonary

## 2023-12-23 NOTE — Progress Notes (Signed)
 Subjective:    Patient ID: Timothy Eaton, adult    DOB: [birth year], 83 y.o.   MRN: [id number]  HPI Here for hospital follow up With wife  Admitted from office 5/7 with hypoxic respiratory failure Diagnosed with COPD and treated--severe emphysema noted on CT scan Lung nodule--no acute action needed Also with complex cyst or mass left kidney---needs MRI Discharged 5/12--on anoro for his breathing, and home oxygen Stable GFR in 40's  Not really following directions Reluctant about wearing the oxygne---"I don't need it" Only activity since coming home has been a shower Hasn't been able to drive as yet Wife monitoring oximetry--has gone down to 78%--still resists the oxygen  Not much cough No fever  Current Outpatient Medications on File Prior to Visit  Medication Sig Dispense Refill   albuterol  (VENTOLIN  HFA) 108 (90 Base) MCG/ACT inhaler Inhale 2 puffs into the lungs every 6 (six) hours as needed for wheezing or shortness of breath. 8 g 0   aspirin  81 MG EC tablet Take 81 mg by mouth daily.       cyanocobalamin  (VITAMIN B12) 1000 MCG tablet Take 1,000 mcg by mouth daily.     feeding supplement (ENSURE ENLIVE / ENSURE PLUS) LIQD Take 237 mLs by mouth 2 (two) times daily between meals.     hydrochlorothiazide  (HYDRODIURIL ) 12.5 MG tablet Take 1 tablet (12.5 mg total) by mouth daily. 90 tablet 3   isosorbide  mononitrate (IMDUR ) 30 MG 24 hr tablet Take 1 tablet (30 mg total) by mouth daily. 90 tablet 0   mometasone (ELOCON) 0.1 % ointment Apply topically 2 (two) times daily.     nitroGLYCERIN  (NITROSTAT ) 0.4 MG SL tablet Place 1 tablet (0.4 mg total) under the tongue every 5 (five) minutes as needed (for chest pain or severe dyspnea). 75 tablet 3   potassium chloride  SA (KLOR-CON  M) 20 MEQ tablet TAKE 1 TABLET(20 MEQ) BY MOUTH DAILY 90 tablet 3   rosuvastatin  (CRESTOR ) 20 MG tablet Take 1 tablet (20 mg total) by mouth daily. 90 tablet 3   tamsulosin  (FLOMAX ) 0.4 MG CAPS capsule TAKE 1  CAPSULE(0.4 MG) BY MOUTH DAILY 90 capsule 3   umeclidinium-vilanterol (ANORO ELLIPTA ) 62.5-25 MCG/ACT AEPB Inhale 1 puff into the lungs daily. 60 each 2   [Paused] metoprolol  tartrate (LOPRESSOR ) 25 MG tablet Take 1 tablet (25 mg total) by mouth 2 (two) times daily. (Patient not taking: Reported on 12/23/2023) 180 tablet 3   No current facility-administered medications on file prior to visit.    Allergies  Allergen Reactions   Sulfamethoxazole-Trimethoprim Rash    Past Medical History:  Diagnosis Date   AAA (abdominal aortic aneurysm) (HCC)    [place], Repaired, 2005   Atrial fibrillation (HCC)    Postoperative in past   Carotid artery disease (HCC)    Minimal, doppler 2004, 0-39% bilateral, doppler 05/17/09 0-39% bilateral   COPD (chronic obstructive pulmonary disease) (HCC)    Coronary artery disease    Moderate, catheterization 2005, nuclear scan 9/09 no ischemia, EF 60%, echo   ED (erectile dysfunction)    Ejection fraction    EF 60% echo, September, 2009   Family history of adverse reaction to anesthesia    DAD-WOKE UP DURING SURGERY   GERD (gastroesophageal reflux disease)    Hemorrhoids    Hyperlipidemia    Hypertension    Lung nodule    Followup by pulmonary in past   Palpitations    History palpitations in the past   Palpitations  January, 2013   Shortness of breath     Past Surgical History:  Procedure Laterality Date   ABDOMINAL AORTIC ANEURYSM REPAIR  04/06/04   Dr. Alease Amend SURGERY     CHOLECYSTECTOMY  11/26/02   CYSTOSCOPY W/ RETROGRADES Left 01/05/2020   Procedure: CYSTOSCOPY WITH RETROGRADE PYELOGRAM;  Surgeon: Geraline Knapp, MD;  Location: [facility];  Service: Urology;  Laterality: Left;   CYSTOSCOPY WITH STENT PLACEMENT Left 12/12/2019   Procedure: CYSTOSCOPY WITH STENT PLACEMENT;  Surgeon: Samson Croak, MD;  Location: [facility];  Service: Urology;  Laterality: Left;   CYSTOSCOPY/URETEROSCOPY/HOLMIUM LASER/STENT PLACEMENT Left 01/05/2020    Procedure: CYSTOSCOPY/URETEROSCOPY/HOLMIUM LASER/STENT Exchange;  Surgeon: Geraline Knapp, MD;  Location: [facility];  Service: Urology;  Laterality: Left;   LEFT HEART CATH AND CORONARY ANGIOGRAPHY N/A 03/09/2022   Procedure: LEFT HEART CATH AND CORONARY ANGIOGRAPHY;  Surgeon: Odie Benne, MD;  Location: [facility];  Service: Cardiovascular;  Laterality: N/A;   PILONIDAL CYST EXCISION  11/11    Family History  Problem Relation Age of Onset   Hypertension Mother        ?   Stroke Mother        CVA   Diabetes Mother        DM   Heart attack Father        MI   Cancer Sister        Gall bladder    Stroke Other        All mom's family died of strokes    Social History   Socioeconomic History   Marital status: Married    Spouse name: Not on file   Number of children: 3   Years of education: Not on file   Highest education level: Not on file  Occupational History   Occupation: Truck driver    Comment: Retired  Tobacco Use   Smoking status: Former   Smokeless tobacco: Current    Types: Chew  Substance and Sexual Activity   Alcohol use: No   Drug use: No   Sexual activity: Not on file  Other Topics Concern   Not on file  Social History Narrative   No living will   Would want wife--then daughter Jullie Oiler-- to make decisions for him.   Would accept resuscitation   No feeding tube if cognitively unaware   Social Drivers of Health   Financial Resource Strain: Not on file  Food Insecurity: No Food Insecurity (12/17/2023)   Hunger Vital Sign    Worried About Running Out of Food in the Last Year: Never true    Ran Out of Food in the Last Year: Never true  Transportation Needs: No Transportation Needs (12/17/2023)   PRAPARE - Administrator, Civil Service (Medical): No    Lack of Transportation (Non-Medical): No  Physical Activity: Not on file  Stress: Not on file  Social Connections: Patient Declined (12/12/2023)   Social Connection and  Isolation Panel [NHANES]    Frequency of Communication with Friends and Family: Patient declined    Frequency of Social Gatherings with Friends and Family: Patient declined    Attends Religious Services: Patient declined    Database administrator or Organizations: Patient declined    Attends Banker Meetings: Patient declined    Marital Status: Patient declined  Intimate Partner Violence: Patient Unable To Answer (12/17/2023)   Humiliation, Afraid, Rape, and Kick questionnaire    Fear of Current  or Ex-Partner: Patient unable to answer    Emotionally Abused: Patient unable to answer    Physically Abused: Patient unable to answer    Sexually Abused: Patient unable to answer   Review of Systems Eating okay Weight is stable Is sleeping okay More stable using walker at times    Objective:   Physical Exam Cardiovascular:     Rate and Rhythm: Normal rate and regular rhythm.     Heart sounds: No murmur heard.    No gallop.  Pulmonary:     Effort: Pulmonary effort is normal.     Breath sounds: No wheezing or rales.     Comments: Very decreased breath sounds but clear Abdominal:     Palpations: Abdomen is soft.     Tenderness: There is no abdominal tenderness.  Musculoskeletal:     Cervical back: Neck supple.     Right lower leg: No edema.     Left lower leg: No edema.  Lymphadenopathy:     Cervical: No cervical adenopathy.  Neurological:     Mental Status: He is alert.            Assessment & Plan:

## 2024-01-07 ENCOUNTER — Encounter: Payer: Self-pay | Admitting: Cardiovascular Disease

## 2024-01-15 ENCOUNTER — Ambulatory Visit
Admission: RE | Admit: 2024-01-15 | Discharge: 2024-01-15 | Disposition: A | Discharge status: Home / Self Care | Source: Ambulatory Visit | Attending: Internal Medicine | Admitting: Internal Medicine

## 2024-01-15 DIAGNOSIS — N2889 Other specified disorders of kidney and ureter: Secondary | ICD-10-CM | POA: Insufficient documentation

## 2024-01-15 DIAGNOSIS — Z9049 Acquired absence of other specified parts of digestive tract: Secondary | ICD-10-CM | POA: Diagnosis not present

## 2024-01-15 DIAGNOSIS — K5730 Diverticulosis of large intestine without perforation or abscess without bleeding: Secondary | ICD-10-CM | POA: Diagnosis not present

## 2024-01-15 DIAGNOSIS — N281 Cyst of kidney, acquired: Secondary | ICD-10-CM | POA: Diagnosis not present

## 2024-01-15 MED ORDER — GADOBUTROL 1 MMOL/ML IV SOLN
6.0000 mL | Freq: Once | INTRAVENOUS | Status: AC | PRN
  Administered 2024-01-15: 6 mL via INTRAVENOUS

## 2024-01-22 ENCOUNTER — Telehealth: Payer: Self-pay

## 2024-01-22 NOTE — Telephone Encounter (Signed)
 Spoke to pt's wife per DPR. Gave the MRI Results. She said he will not use the oxygen at all. He will not use his inhalers. She is at her wits end because she wants him to do what he is supposed to do, but she knows he will not do it. He has an appt with pulmonary in July. She is pretty sure he will not go. With his memory issues, it makes things worse. Although, he does not feel he has a memory issue. She is concerned for his safety. He has been violent at times. She does not want to get rid of the oxygen because he may need it. We decided it would be best to have him come see Dr Joelle Musca and discuss everything without [name] being in the room.

## 2024-01-22 NOTE — Telephone Encounter (Signed)
 Copied from CRM [id number]. Topic: Clinical - Lab/Test Results >> Jan 22, 2024 12:54 PM Adonis Hoot wrote: Reason for CRM: Patients wife call in regarding patients MRI and lab results from labs he had done in May.Stated that she never heard back from either of these results.I relayed result message to her from [place],I told her that copies should be mailed to her home.Patient verbalized understanding.

## 2024-01-23 NOTE — Telephone Encounter (Signed)
 None of this is new but it is unclear what can be done about it. Might need to assess if wife clearly feels in danger Will review issues at tomorrow's appointment

## 2024-01-24 ENCOUNTER — Ambulatory Visit: Admitting: Internal Medicine

## 2024-01-24 ENCOUNTER — Encounter: Payer: Self-pay | Admitting: Internal Medicine

## 2024-01-24 VITALS — BP 130/82 | HR 81 | Ht 69.0 in | Wt 134.0 lb

## 2024-01-24 DIAGNOSIS — F01A0 Vascular dementia, mild, without behavioral disturbance, psychotic disturbance, mood disturbance, and anxiety: Secondary | ICD-10-CM | POA: Diagnosis not present

## 2024-01-24 DIAGNOSIS — E441 Mild protein-calorie malnutrition: Secondary | ICD-10-CM | POA: Insufficient documentation

## 2024-01-24 DIAGNOSIS — J9611 Chronic respiratory failure with hypoxia: Secondary | ICD-10-CM | POA: Diagnosis not present

## 2024-01-24 DIAGNOSIS — J439 Emphysema, unspecified: Secondary | ICD-10-CM | POA: Diagnosis not present

## 2024-01-24 DIAGNOSIS — F0150 Vascular dementia without behavioral disturbance: Secondary | ICD-10-CM | POA: Insufficient documentation

## 2024-01-24 NOTE — Assessment & Plan Note (Signed)
 He recognizes his memory deficits but forgets  Likely related to hypoxia Urged him to use the oxygen regularly

## 2024-01-24 NOTE — Assessment & Plan Note (Signed)
 Bad recent exacerbation has fortunately improved Now refusing Rx at home--and forgetting things Urged him to take the anoro daily Has pulmonary appt next month

## 2024-01-24 NOTE — Assessment & Plan Note (Signed)
 He has lost ~20# from his baseline Is eating some and stabilized Discussed that this is likely pulmonary cachexia

## 2024-01-24 NOTE — Assessment & Plan Note (Signed)
 Hasn't been using the oxygen Urged him to start---at least for activity and sleep

## 2024-01-24 NOTE — Patient Instructions (Addendum)
 Please use the anoro inhaler every day Please use the oxygen regularly---with any activity and for sleep You forget things all the time---please listen to your wife when she reminds you of what we doctors want you to do. Please keep the appointment with Dr Reesa Cannon lung doctor Please use calorie supplements like Boost or ensure---once or twice a day in between meals (to help you keep your weight up)

## 2024-01-24 NOTE — Progress Notes (Signed)
 Subjective:    Patient ID: Timothy Eaton, male    DOB: 1940-11-15, 83 y.o.   MRN: [id number]  HPI Here to review breathing and mood issues  He feels he doesn't need the oxygen--feels he doesn't need it Not really taking inhalers  He denies any issues with wife or his mood  He does admit memory issues He forgot from one minute to the next our discussion about the nasal prongs he has (to be able to use the oxygen)  Reviewed MRI Kidney cysts were okay--no action needed  Current Outpatient Medications on File Prior to Visit  Medication Sig Dispense Refill   aspirin  81 MG EC tablet Take 81 mg by mouth daily.       cyanocobalamin  (VITAMIN B12) 1000 MCG tablet Take 1,000 mcg by mouth daily.     feeding supplement (ENSURE ENLIVE / ENSURE PLUS) LIQD Take 237 mLs by mouth 2 (two) times daily between meals.     hydrochlorothiazide  (HYDRODIURIL ) 12.5 MG tablet Take 1 tablet (12.5 mg total) by mouth daily. 90 tablet 3   isosorbide  mononitrate (IMDUR ) 30 MG 24 hr tablet Take 1 tablet (30 mg total) by mouth daily. 90 tablet 0   [Paused] metoprolol  tartrate (LOPRESSOR ) 25 MG tablet Take 1 tablet (25 mg total) by mouth 2 (two) times daily. 180 tablet 3   mometasone (ELOCON) 0.1 % ointment Apply topically 2 (two) times daily.     nitroGLYCERIN  (NITROSTAT ) 0.4 MG SL tablet Place 1 tablet (0.4 mg total) under the tongue every 5 (five) minutes as needed (for chest pain or severe dyspnea). 75 tablet 3   potassium chloride  SA (KLOR-CON  M) 20 MEQ tablet TAKE 1 TABLET(20 MEQ) BY MOUTH DAILY 90 tablet 3   rosuvastatin  (CRESTOR ) 20 MG tablet Take 1 tablet (20 mg total) by mouth daily. 90 tablet 3   tamsulosin  (FLOMAX ) 0.4 MG CAPS capsule TAKE 1 CAPSULE(0.4 MG) BY MOUTH DAILY 90 capsule 3   umeclidinium-vilanterol (ANORO ELLIPTA ) 62.5-25 MCG/ACT AEPB Inhale 1 puff into the lungs daily. 60 each 2   albuterol  (VENTOLIN  HFA) 108 (90 Base) MCG/ACT inhaler Inhale 2 puffs into the lungs every 6 (six) hours as needed  for wheezing or shortness of breath. (Patient not taking: Reported on 01/24/2024) 8 g 0   No current facility-administered medications on file prior to visit.    Allergies  Allergen Reactions   Sulfamethoxazole-Trimethoprim Rash    Past Medical History:  Diagnosis Date   AAA (abdominal aortic aneurysm) (HCC)    [place], Repaired, 2005   Atrial fibrillation (HCC)    Postoperative in past   Carotid artery disease (HCC)    Minimal, doppler 2004, 0-39% bilateral, doppler 05/17/09 0-39% bilateral   COPD (chronic obstructive pulmonary disease) (HCC)    Coronary artery disease    Moderate, catheterization 2005, nuclear scan 9/09 no ischemia, EF 60%, echo   ED (erectile dysfunction)    Ejection fraction    EF 60% echo, September, 2009   Family history of adverse reaction to anesthesia    DAD-WOKE UP DURING SURGERY   GERD (gastroesophageal reflux disease)    Hemorrhoids    Hyperlipidemia    Hypertension    Lung nodule    Followup by pulmonary in past   Palpitations    History palpitations in the past   Palpitations    January, 2013   Shortness of breath     Past Surgical History:  Procedure Laterality Date   ABDOMINAL AORTIC ANEURYSM REPAIR  04/06/2004  Dr. Alease Amend SURGERY     CHOLECYSTECTOMY  11/26/2002   CYSTOSCOPY W/ RETROGRADES Left 01/05/2020   Procedure: CYSTOSCOPY WITH RETROGRADE PYELOGRAM;  Surgeon: Geraline Knapp, MD;  Location: [facility];  Service: Urology;  Laterality: Left;   CYSTOSCOPY WITH STENT PLACEMENT Left 12/12/2019   Procedure: CYSTOSCOPY WITH STENT PLACEMENT;  Surgeon: Samson Croak, MD;  Location: [facility];  Service: Urology;  Laterality: Left;   CYSTOSCOPY/URETEROSCOPY/HOLMIUM LASER/STENT PLACEMENT Left 01/05/2020   Procedure: CYSTOSCOPY/URETEROSCOPY/HOLMIUM LASER/STENT Exchange;  Surgeon: Geraline Knapp, MD;  Location: [facility];  Service: Urology;  Laterality: Left;   LEFT HEART CATH AND CORONARY ANGIOGRAPHY N/A 03/09/2022   Procedure:  LEFT HEART CATH AND CORONARY ANGIOGRAPHY;  Surgeon: Odie Benne, MD;  Location: [facility];  Service: Cardiovascular;  Laterality: N/A;   PILONIDAL CYST EXCISION  06/06/2010    Family History  Problem Relation Age of Onset   Hypertension Mother        ?   Stroke Mother        CVA   Diabetes Mother        DM   Heart attack Father        MI   Cancer Sister        Gall bladder    Stroke Other        All mom's family died of strokes    Social History   Socioeconomic History   Marital status: Married    Spouse name: Not on file   Number of children: 3   Years of education: Not on file   Highest education level: Not on file  Occupational History   Occupation: Truck driver    Comment: Retired  Tobacco Use   Smoking status: Former   Smokeless tobacco: Current    Types: Chew  Substance and Sexual Activity   Alcohol use: No   Drug use: No   Sexual activity: Not on file  Other Topics Concern   Not on file  Social History Narrative   No living will   Would want wife--then daughter Jullie Oiler-- to make decisions for him.   Would accept resuscitation   No feeding tube if cognitively unaware   Social Drivers of Health   Financial Resource Strain: Not on file  Food Insecurity: No Food Insecurity (12/17/2023)   Hunger Vital Sign    Worried About Running Out of Food in the Last Year: Never true    Ran Out of Food in the Last Year: Never true  Transportation Needs: No Transportation Needs (12/17/2023)   PRAPARE - Administrator, Civil Service (Medical): No    Lack of Transportation (Non-Medical): No  Physical Activity: Not on file  Stress: Not on file  Social Connections: Patient Declined (12/12/2023)   Social Connection and Isolation Panel    Frequency of Communication with Friends and Family: Patient declined    Frequency of Social Gatherings with Friends and Family: Patient declined    Attends Religious Services: Patient declined    Automotive engineer or Organizations: Patient declined    Attends Banker Meetings: Patient declined    Marital Status: Patient declined  Intimate Partner Violence: Patient Unable To Answer (12/17/2023)   Humiliation, Afraid, Rape, and Kick questionnaire    Fear of Current or Ex-Partner: Patient unable to answer    Emotionally Abused: Patient unable to answer    Physically Abused: Patient unable to answer    Sexually Abused:  Patient unable to answer   Review of Systems Not much appetite--but weight is holding at lower level Sleeps okay--but some trouble initiating     Objective:   Physical Exam Constitutional:      Appearance: Normal appearance.   Cardiovascular:     Rate and Rhythm: Normal rate and regular rhythm.     Heart sounds: No murmur heard.    No gallop.  Pulmonary:     Effort: Pulmonary effort is normal.     Breath sounds: No wheezing or rales.     Comments: Decreased breath sounds but clear  Musculoskeletal:     Cervical back: Neck supple.     Right lower leg: No edema.     Left lower leg: No edema.  Lymphadenopathy:     Cervical: No cervical adenopathy.   Neurological:     Mental Status: He is alert.     Comments: Repeats his questions within a minute of me answering them           Assessment & Plan:

## 2024-02-13 ENCOUNTER — Ambulatory Visit (INDEPENDENT_AMBULATORY_CARE_PROVIDER_SITE_OTHER): Admitting: Pulmonary Disease

## 2024-02-13 ENCOUNTER — Encounter: Payer: Self-pay | Admitting: Pulmonary Disease

## 2024-02-13 VITALS — BP 130/82 | HR 81 | Temp 97.8°F | Ht 69.0 in | Wt 136.0 lb

## 2024-02-13 DIAGNOSIS — J449 Chronic obstructive pulmonary disease, unspecified: Secondary | ICD-10-CM | POA: Diagnosis not present

## 2024-02-13 DIAGNOSIS — G4736 Sleep related hypoventilation in conditions classified elsewhere: Secondary | ICD-10-CM

## 2024-02-13 DIAGNOSIS — J439 Emphysema, unspecified: Secondary | ICD-10-CM | POA: Diagnosis not present

## 2024-02-13 MED ORDER — TRELEGY ELLIPTA 100-62.5-25 MCG/ACT IN AEPB: 1.0000 | INHALATION_SPRAY | Freq: Every day | RESPIRATORY_TRACT | Status: DC

## 2024-02-13 NOTE — Patient Instructions (Signed)
 VISIT SUMMARY:  Today, we discussed your ongoing issues with shortness of breath and reviewed your history of emphysema. We also talked about your previous hospitalization for acute respiratory failure and the pulmonary nodule found on your CT scan. We have made some adjustments to your treatment plan to help manage your symptoms more effectively.  YOUR PLAN:  -EMPHYSEMA: Emphysema is a lung condition that causes shortness of breath due to damage to the air sacs in the lungs. You have extensive emphysema changes throughout your lungs. You have been advised to use oxygen at night and will be switching to a stronger inhaler with anti-inflammatory properties. Please use one puff a day and rinse your mouth after each use. We will also set up overnight oximetry to assess your oxygen levels during sleep and schedule pulmonary function tests in the office.  -ACUTE RESPIRATORY FAILURE WITH HYPOXIA (RESOLVED): Acute respiratory failure with hypoxia is a condition where the lungs cannot provide enough oxygen to the body. This condition has resolved, and you have been weaned off oxygen.  -PULMONARY NODULE: A pulmonary nodule is a small, round growth in the lung. A 14x9 mm nodule was found in your left lower lobe during a CT scan in May 2025. We need to monitor this nodule for any changes, so we will repeat a chest CT without contrast before your next visit.  INSTRUCTIONS:  Please use the new inhaler (Trelegy) as directed, one puff a day, and rinse your mouth after each use. We will set up overnight oximetry to check your oxygen levels while you sleep and schedule pulmonary function tests in the office. Additionally, we will repeat a chest CT without contrast before your next visit to monitor the pulmonary nodule.

## 2024-02-13 NOTE — Progress Notes (Signed)
 Subjective:    Patient ID: Timothy Eaton, male    DOB: [birth year], 83 y.o.   MRN: [id number]  Patient Care Team: Jimmy Charlie FERNS, MD as PCP - General Nahser, Aleene PARAS, MD as PCP - Cardiology (Cardiology)  Chief Complaint  Patient presents with   Consult    Shortness of breath on exertion.     BACKGROUND: Patient is an 83 year old former smoker with a 66-pack-year history of smoking who presents for evaluation of shortness of breath on exertion.  He is kindly referred by Dr. Charlie Jimmy.  HPI Discussed the use of AI scribe software for clinical note transcription with the patient, who gave verbal consent to proceed.  History of Present Illness   Timothy Eaton is an 83 year old male with emphysema who presents with shortness of breath.  He experiences shortness of breath, previously noted to be due to inadequate oxygenation. Although oxygen  was provided, he never used it, feeling he could breathe adequately without it. He does not use oxygen  at night, stating he does not feel the need for it.  He has a history of emphysema. He was admitted in May to [facility] for acute respiratory failure with hypoxia, requiring high flow oxygen , and was started on Anoro at that time. He has since been weaned off oxygen .  He does not find that Anoro is very helpful for his shortness of breath.  He quit smoking over 25 years ago, having been a heavy smoker during his time as a [employer]. He recalls exposure to diesel smoke in old trucks, which he describes as 'pretty bad'.  He is currently using an inhaler, albuterol , as needed, which he feels may be helping him. He is on Anoro Ellipta  for maintenance, 1 puff daily.  A CT scan from Dec 11, 2023, during his hospitalization, showed a 14 by 9 mm nodule in the left lower lobe, which requires follow-up.  Imaging also shows very severe emphysema these findings were discussed in detail with the patient.   DATA 12/11/2023 CT angio chest: No pulmonary  embolism, 14 x 9 mm subpleural nodule in the left lower lobe which appears to be enlarged compared to prior exam of 2023.  Consider short-term follow-up in 3 months, follow-up PET/CT or tissue sampling.  Severe pulmonary emphysema noted.  Review of Systems A 10 point review of systems was performed and it is as noted above otherwise negative.   Past Medical History:  Diagnosis Date   AAA (abdominal aortic aneurysm) (HCC)    Dickson, Repaired, 2005   Atrial fibrillation (HCC)    Postoperative in past   Carotid artery disease (HCC)    Minimal, doppler 2004, 0-39% bilateral, doppler 05/17/09 0-39% bilateral   COPD (chronic obstructive pulmonary disease) (HCC)    Coronary artery disease    Moderate, catheterization 2005, nuclear scan 9/09 no ischemia, EF 60%, echo   ED (erectile dysfunction)    Ejection fraction    EF 60% echo, September, 2009   Family history of adverse reaction to anesthesia    DAD-WOKE UP DURING SURGERY   GERD (gastroesophageal reflux disease)    Hemorrhoids    Hyperlipidemia    Hypertension    Lung nodule    Followup by pulmonary in past   Palpitations    History palpitations in the past   Palpitations    January, 2013   Shortness of breath     Past Surgical History:  Procedure Laterality Date   ABDOMINAL AORTIC  ANEURYSM REPAIR  04/06/2004   Dr. Eliza SANES SURGERY     CHOLECYSTECTOMY  11/26/2002   CYSTOSCOPY W/ RETROGRADES Left 01/05/2020   Procedure: CYSTOSCOPY WITH RETROGRADE PYELOGRAM;  Surgeon: Twylla Glendia BROCKS, MD;  Location: [facility] ORS;  Service: Urology;  Laterality: Left;   CYSTOSCOPY WITH STENT PLACEMENT Left 12/12/2019   Procedure: CYSTOSCOPY WITH STENT PLACEMENT;  Surgeon: Carolee Sherwood JONETTA DOUGLAS, MD;  Location: [facility] ORS;  Service: Urology;  Laterality: Left;   CYSTOSCOPY/URETEROSCOPY/HOLMIUM LASER/STENT PLACEMENT Left 01/05/2020   Procedure: CYSTOSCOPY/URETEROSCOPY/HOLMIUM LASER/STENT Exchange;  Surgeon: Twylla Glendia BROCKS, MD;  Location: [facility] ORS;   Service: Urology;  Laterality: Left;   LEFT HEART CATH AND CORONARY ANGIOGRAPHY N/A 03/09/2022   Procedure: LEFT HEART CATH AND CORONARY ANGIOGRAPHY;  Surgeon: Verlin Lonni JONETTA, MD;  Location: [facility];  Service: Cardiovascular;  Laterality: N/A;   PILONIDAL CYST EXCISION  06/06/2010    Patient Active Problem List   Diagnosis Date Noted   Malnutrition of mild degree (HCC) 01/24/2024   Vascular dementia (HCC) 01/24/2024   Chronic hypoxemic respiratory failure (HCC) 12/12/2023   Complex renal cyst left kidney 12/11/2023   Decreased appetite 10/16/2023   Urinary frequency 10/16/2023   Elevated glucose 10/16/2023   Thrombocytopenia (HCC) 12/12/2022   Stage 3a chronic kidney disease (HCC) 01/09/2020   Nephrolithiasis 12/19/2019   Memory loss 12/09/2019   BPH with obstruction/lower urinary tract symptoms 07/07/2019   Preventative health care 04/09/2017   Advance directive discussed with patient 04/09/2017   COPD exacerbation (HCC) 07/29/2015   Coronary artery disease    Carotid artery disease (HCC)    History of postop paroxysmal atrial fibrillation 2005 [facility])    Pulmonary nodule 1 cm or greater in diameter 04/02/2011   COPD (chronic obstructive pulmonary disease) with emphysema (HCC) 04/02/2009   HEMORRHOIDS, HX OF 04/02/2009   Hyperlipidemia 06/25/2008   Essential hypertension 06/25/2008    Family History  Problem Relation Age of Onset   Hypertension Mother        ?   Stroke Mother        CVA   Diabetes Mother        DM   Heart attack Father        MI   Cancer Sister        Gall bladder    Stroke Other        All mom's family died of strokes    Social History   Tobacco Use   Smoking status: Former    Current packs/day: 0.00    Average packs/day: 2.0 packs/day for 33.0 years (66.0 ttl pk-yrs)    Types: Cigarettes    Start date: 08/06/1965    Quit date: 08/06/1998    Years since quitting: 25.5   Smokeless tobacco: Current    Types: Chew  Substance  Use Topics   Alcohol use: No    Allergies  Allergen Reactions   Sulfamethoxazole-Trimethoprim Rash    Current Meds  Medication Sig   albuterol  (VENTOLIN  HFA) 108 (90 Base) MCG/ACT inhaler Inhale 2 puffs into the lungs every 6 (six) hours as needed for wheezing or shortness of breath.   aspirin  81 MG EC tablet Take 81 mg by mouth daily.     cyanocobalamin  (VITAMIN B12) 1000 MCG tablet Take 1,000 mcg by mouth daily.   feeding supplement (ENSURE ENLIVE / ENSURE PLUS) LIQD Take 237 mLs by mouth 2 (two) times daily between meals.   Fluticasone-Umeclidin-Vilant (TRELEGY ELLIPTA ) 100-62.5-25 MCG/ACT AEPB Inhale 1  puff into the lungs daily in the afternoon.   hydrochlorothiazide  (HYDRODIURIL ) 12.5 MG tablet Take 1 tablet (12.5 mg total) by mouth daily.   isosorbide  mononitrate (IMDUR ) 30 MG 24 hr tablet Take 1 tablet (30 mg total) by mouth daily.   [Paused] metoprolol  tartrate (LOPRESSOR ) 25 MG tablet Take 1 tablet (25 mg total) by mouth 2 (two) times daily.   mometasone (ELOCON) 0.1 % ointment Apply topically 2 (two) times daily.   nitroGLYCERIN  (NITROSTAT ) 0.4 MG SL tablet Place 1 tablet (0.4 mg total) under the tongue every 5 (five) minutes as needed (for chest pain or severe dyspnea).   potassium chloride  SA (KLOR-CON  M) 20 MEQ tablet TAKE 1 TABLET(20 MEQ) BY MOUTH DAILY   rosuvastatin  (CRESTOR ) 20 MG tablet Take 1 tablet (20 mg total) by mouth daily.   tamsulosin  (FLOMAX ) 0.4 MG CAPS capsule TAKE 1 CAPSULE(0.4 MG) BY MOUTH DAILY   [DISCONTINUED] umeclidinium-vilanterol (ANORO ELLIPTA ) 62.5-25 MCG/ACT AEPB Inhale 1 puff into the lungs daily.    Immunization History  Administered Date(s) Administered   Influenza Split 06/04/2012   Influenza Whole 04/19/2010   Influenza, High Dose Seasonal PF 04/27/2019, 04/10/2023   Influenza, Seasonal, Injecte, Preservative Fre 05/01/2016   Influenza,inj,Quad PF,6+ Mos 04/09/2017   Influenza-Unspecified 06/06/2018, 05/01/2022   Pneumococcal  Conjugate-13 04/09/2017   Pneumococcal Polysaccharide-23 06/25/2008   Td 08/07/1999, 04/19/2010        Objective:     BP 130/82 (BP Location: Left Arm, Patient Position: Sitting, Cuff Size: Normal)   Pulse 81   Temp 97.8 F (36.6 C) (Oral)   Ht 5' 9 (1.753 m)   Wt 136 lb (61.7 kg)   SpO2 95%   BMI 20.08 kg/m   SpO2: 95 %  GENERAL: Thin elderly male, no acute distress.  Fully ambulatory.  No conversational dyspnea. HEAD: Normocephalic, atraumatic.  EYES: Pupils equal, round, reactive to light.  No scleral icterus.  MOUTH: Edentulous, oral mucosa moist.  No thrush. NECK: Supple. No thyromegaly. Trachea midline. No JVD.  No adenopathy. PULMONARY: Diminished breath sounds bilaterally.  Coarse, otherwise no adventitious sounds. CARDIOVASCULAR: S1 and S2. Regular rate and rhythm.  No rubs, murmurs or gallops heard. ABDOMEN: Benign. MUSCULOSKELETAL: No joint deformity, no clubbing, no edema.  NEUROLOGIC: No overt focal deficit, no gait disturbance, speech is fluent. SKIN: Intact,warm,dry. PSYCH: Mood and behavior normal.  Occasionally befuddled.    Ambulatory oxymetry was performed today:  At rest on room air oxygen  saturation was 98%, the patient ambulated at a normal pace, completed 3 laps, O2 nadir 90%, mild shortness of breath.  Resting heart rate was 74 bpm at maximum for this exercise 113 bpm.  Patient has modest desaturation with exercise however not qualifying for O2 supplementation.   Assessment & Plan:     ICD-10-CM   1. COPD suggested by initial evaluation (HCC)  J44.9 Pulmonary function test    Overnight Pulse Oximetry Study    2. Pulmonary emphysema, unspecified emphysema type (HCC)  J43.9 Overnight Pulse Oximetry Study    CT CHEST WO CONTRAST    3. Nocturnal hypoxemia due to emphysema (HCC)  J43.9 Overnight Pulse Oximetry Study   G47.36       Orders Placed This Encounter  Procedures   CT CHEST WO CONTRAST    Standing Status:   Future    Expected Date:    03/15/2024    Expiration Date:   02/12/2025    Preferred imaging location?:   [facility]   Overnight Pulse Oximetry Study  Standing Status:   Future    Expiration Date:   02/12/2025    Scheduling Instructions:     On Room Air   Pulmonary function test    Standing Status:   Future    Expiration Date:   02/12/2025    Where should this test be performed?:   Outpatient Pulmonary    What type of PFT is being ordered?:   Full PFT    Meds ordered this encounter  Medications   Fluticasone-Umeclidin-Vilant (TRELEGY ELLIPTA ) 100-62.5-25 MCG/ACT AEPB    Sig: Inhale 1 puff into the lungs daily in the afternoon.    Dispense:  2 each    Lot Number?:   [id number]    Expiration Date?:   04/06/2025    Manufacturer?:   [employer] [[id number]]    NDC:   9826-9112-38 [627739]    Quantity:   2   Assessment and Plan    Emphysema Extensive emphysema changes throughout the lungs. Past heavy smoker, quit over 25 years ago. No current smoking. Previously admitted with acute respiratory failure with hypoxia, now resolved. Currently not using oxygen  but advised to use it at night. Using an inhaler, but will switch to Trelegy Ellipta  100. - Provide samples of Trelegy Ellipta  with anti-inflammatory properties - Instruct to use one puff a day and rinse mouth after use - Set up overnight oximetry to assess oxygen  levels during sleep - Set up pulmonary function tests (PFTs) in the office  Acute respiratory failure with hypoxia (resolved) Previously admitted with acute respiratory failure with hypoxia requiring high flow oxygen . Condition has resolved and he has been weaned off oxygen .  Pulmonary nodule 14x9 mm nodule in the left lower lobe noted on CT scan from Dec 11, 2023. Requires follow-up to monitor for changes. - Repeat chest CT without contrast prior to next visit      Advised if symptoms do not improve or worsen, to please contact office for sooner follow up or seek emergency care.    I spent 60 minutes  of dedicated to the care of this patient on the date of this encounter to include pre-visit review of records, face-to-face time with the patient discussing conditions above, post visit ordering of testing, clinical documentation with the electronic health record, making appropriate referrals as documented, and communicating necessary findings to members of the patients care team.   C. Leita Sanders, MD Advanced Bronchoscopy PCCM [facility]    *This note was dictated using voice recognition software/Dragon.  Despite best efforts to proofread, errors can occur which can change the meaning. Any transcriptional errors that result from this process are unintentional and may not be fully corrected at the time of dictation.

## 2024-02-17 ENCOUNTER — Encounter: Payer: Self-pay | Admitting: Pulmonary Disease

## 2024-02-17 DIAGNOSIS — D2272 Melanocytic nevi of left lower limb, including hip: Secondary | ICD-10-CM | POA: Diagnosis not present

## 2024-02-17 DIAGNOSIS — D2262 Melanocytic nevi of left upper limb, including shoulder: Secondary | ICD-10-CM | POA: Diagnosis not present

## 2024-02-17 DIAGNOSIS — Z85828 Personal history of other malignant neoplasm of skin: Secondary | ICD-10-CM | POA: Diagnosis not present

## 2024-02-17 DIAGNOSIS — D225 Melanocytic nevi of trunk: Secondary | ICD-10-CM | POA: Diagnosis not present

## 2024-02-17 DIAGNOSIS — D2261 Melanocytic nevi of right upper limb, including shoulder: Secondary | ICD-10-CM | POA: Diagnosis not present

## 2024-02-17 DIAGNOSIS — L570 Actinic keratosis: Secondary | ICD-10-CM | POA: Diagnosis not present

## 2024-02-19 ENCOUNTER — Encounter: Payer: Self-pay | Admitting: Cardiovascular Disease

## 2024-02-25 ENCOUNTER — Encounter: Payer: Self-pay | Admitting: Internal Medicine

## 2024-02-25 ENCOUNTER — Ambulatory Visit (INDEPENDENT_AMBULATORY_CARE_PROVIDER_SITE_OTHER): Admitting: Internal Medicine

## 2024-02-25 VITALS — BP 136/82 | HR 72 | Ht 69.0 in | Wt 137.0 lb

## 2024-02-25 DIAGNOSIS — F01A0 Vascular dementia, mild, without behavioral disturbance, psychotic disturbance, mood disturbance, and anxiety: Secondary | ICD-10-CM

## 2024-02-25 DIAGNOSIS — I10 Essential (primary) hypertension: Secondary | ICD-10-CM | POA: Diagnosis not present

## 2024-02-25 DIAGNOSIS — J439 Emphysema, unspecified: Secondary | ICD-10-CM | POA: Diagnosis not present

## 2024-02-25 NOTE — Assessment & Plan Note (Signed)
 Now on trelegy samples Thinks it is helping Now seeing Dr Tamea

## 2024-02-25 NOTE — Progress Notes (Signed)
 Subjective:    Patient ID: Timothy Eaton, male    DOB: 11-16-1940, 83 y.o.   MRN: [id number]  HPI Here for follow up of COPD and other chronic medical conditions  Did go to see the pulmonologist--he doesn't remember Did remember samples of trelegy He thinks it may be helping Not using the oxygen --even at night No cough Thinks his breathing is okay--other than with strenuous tasks  Had abdominal MRI---renal cyst benign  Ongoing memory issues He does realize his memory is off Forgets what his wife tells him very quickly  Current Outpatient Medications on File Prior to Visit  Medication Sig Dispense Refill   albuterol  (VENTOLIN  HFA) 108 (90 Base) MCG/ACT inhaler Inhale 2 puffs into the lungs every 6 (six) hours as needed for wheezing or shortness of breath. 8 g 0   aspirin  81 MG EC tablet Take 81 mg by mouth daily.       cyanocobalamin  (VITAMIN B12) 1000 MCG tablet Take 1,000 mcg by mouth daily.     Fluticasone-Umeclidin-Vilant (TRELEGY ELLIPTA ) 100-62.5-25 MCG/ACT AEPB Inhale 1 puff into the lungs daily in the afternoon. 2 each    nitroGLYCERIN  (NITROSTAT ) 0.4 MG SL tablet Place 1 tablet (0.4 mg total) under the tongue every 5 (five) minutes as needed (for chest pain or severe dyspnea). 75 tablet 3   potassium chloride  SA (KLOR-CON  M) 20 MEQ tablet TAKE 1 TABLET(20 MEQ) BY MOUTH DAILY 90 tablet 3   tamsulosin  (FLOMAX ) 0.4 MG CAPS capsule TAKE 1 CAPSULE(0.4 MG) BY MOUTH DAILY 90 capsule 3   hydrochlorothiazide  (HYDRODIURIL ) 12.5 MG tablet Take 1 tablet (12.5 mg total) by mouth daily. 90 tablet 3   isosorbide  mononitrate (IMDUR ) 30 MG 24 hr tablet Take 1 tablet (30 mg total) by mouth daily. 90 tablet 0   [Paused] metoprolol  tartrate (LOPRESSOR ) 25 MG tablet Take 1 tablet (25 mg total) by mouth 2 (two) times daily. 180 tablet 3   mometasone (ELOCON) 0.1 % ointment Apply topically 2 (two) times daily.     rosuvastatin  (CRESTOR ) 20 MG tablet Take 1 tablet (20 mg total) by mouth daily. 90  tablet 3   No current facility-administered medications on file prior to visit.    Allergies  Allergen Reactions   Sulfamethoxazole-Trimethoprim Rash    Past Medical History:  Diagnosis Date   AAA (abdominal aortic aneurysm) (HCC)    Dickson, Repaired, 2005   Atrial fibrillation (HCC)    Postoperative in past   Carotid artery disease (HCC)    Minimal, doppler 2004, 0-39% bilateral, doppler 05/17/09 0-39% bilateral   COPD (chronic obstructive pulmonary disease) (HCC)    Coronary artery disease    Moderate, catheterization 2005, nuclear scan 9/09 no ischemia, EF 60%, echo   ED (erectile dysfunction)    Ejection fraction    EF 60% echo, September, 2009   Family history of adverse reaction to anesthesia    DAD-WOKE UP DURING SURGERY   GERD (gastroesophageal reflux disease)    Hemorrhoids    Hyperlipidemia    Hypertension    Lung nodule    Followup by pulmonary in past   Palpitations    History palpitations in the past   Palpitations    January, 2013   Shortness of breath     Past Surgical History:  Procedure Laterality Date   ABDOMINAL AORTIC ANEURYSM REPAIR  04/06/2004   Dr. Eliza SANES SURGERY     CHOLECYSTECTOMY  11/26/2002   CYSTOSCOPY W/ RETROGRADES Left 01/05/2020   Procedure:  CYSTOSCOPY WITH RETROGRADE PYELOGRAM;  Surgeon: Twylla Glendia BROCKS, MD;  Location: [facility] OR[facility];  Service: Urology;  Laterality: Left;   CYSTOSCOPY WITH STENT PLACEMENT Left 12/12/2019   Procedure: CYSTOSCOPY WITH STENT PLACEMENT;  Surgeon: Carolee Sherwood JONETTA DOUGLAS, MD;  Location: [facility] OR[facility];  Service: Urology;  Laterality: Left;   CYSTOSCOPY/URETEROSCOPY/HOLMIUM LASER/STENT PLACEMENT Left 01/05/2020   Procedure: CYSTOSCOPY/URETEROSCOPY/HOLMIUM LASER/STENT Exchange;  Surgeon: Twylla Glendia BROCKS, MD;  Location: [facility] ORS;  Service: Urology;  Laterality: Left;   LEFT HEART CATH AND CORONARY ANGIOGRAPHY N/A 03/09/2022   Procedure: LEFT HEART CATH AND CORONARY ANGIOGRAPHY;  Surgeon: Verlin Lonni JONETTA,  MD;  Location: [facility];  Service: Cardiovascular;  Laterality: N/A;   PILONIDAL CYST EXCISION  06/06/2010    Family History  Problem Relation Age of Onset   Hypertension Mother        ?   Stroke Mother        CVA   Diabetes Mother        DM   Heart attack Father        MI   Cancer Sister        Gall bladder    Stroke Other        All mom's family died of strokes    Social History   Socioeconomic History   Marital status: Married    Spouse name: Not on file   Number of children: 3   Years of education: Not on file   Highest education level: Not on file  Occupational History   Occupation: Truck driver    Comment: Retired  Tobacco Use   Smoking status: Former    Current packs/day: 0.00    Average packs/day: 2.0 packs/day for 33.0 years (66.0 ttl pk-yrs)    Types: Cigarettes    Start date: 08/06/1965    Quit date: 08/06/1998    Years since quitting: 25.5   Smokeless tobacco: Current    Types: Chew  Substance and Sexual Activity   Alcohol use: No   Drug use: No   Sexual activity: Not on file  Other Topics Concern   Not on file  Social History Narrative   No living will   Would want wife--then daughter Suzen-- to make decisions for him.   Would accept resuscitation   No feeding tube if cognitively unaware   Social Drivers of Health   Financial Resource Strain: Not on file  Food Insecurity: No Food Insecurity (12/17/2023)   Hunger Vital Sign    Worried About Running Out of Food in the Last Year: Never true    Ran Out of Food in the Last Year: Never true  Transportation Needs: No Transportation Needs (12/17/2023)   PRAPARE - Administrator, Civil Service (Medical): No    Lack of Transportation (Non-Medical): No  Physical Activity: Not on file  Stress: Not on file  Social Connections: Patient Declined (12/12/2023)   Social Connection and Isolation Panel    Frequency of Communication with Friends and Family: Patient declined    Frequency of  Social Gatherings with Friends and Family: Patient declined    Attends Religious Services: Patient declined    Database administrator or Organizations: Patient declined    Attends Banker Meetings: Patient declined    Marital Status: Patient declined  Intimate Partner Violence: Patient Unable To Answer (12/17/2023)   Humiliation, Afraid, Rape, and Kick questionnaire    Fear of Current or Ex-Partner: Patient unable to answer  Emotionally Abused: Patient unable to answer    Physically Abused: Patient unable to answer    Sexually Abused: Patient unable to answer   Review of Systems Sleeps well Appetite is not big--but he tends to snack Weight is stable     Objective:   Physical Exam Constitutional:      Appearance: Normal appearance.  Cardiovascular:     Rate and Rhythm: Normal rate and regular rhythm.     Heart sounds: No murmur heard.    No gallop.  Pulmonary:     Effort: Pulmonary effort is normal.     Breath sounds: No wheezing or rales.     Comments: Decreased breath sounds but clear Musculoskeletal:     Cervical back: Neck supple.     Right lower leg: No edema.     Left lower leg: No edema.  Lymphadenopathy:     Cervical: No cervical adenopathy.  Neurological:     Mental Status: He is alert.            Assessment & Plan:

## 2024-02-25 NOTE — Assessment & Plan Note (Signed)
 BP Readings from Last 3 Encounters:  02/25/24 136/82  02/13/24 130/82  01/24/24 130/82   Good control on hydrochlorothiazide  12.5mg  daily and metoprolol  25 bi

## 2024-02-25 NOTE — Assessment & Plan Note (Signed)
 Aware of cognitive deficits No clear loss of function Is on ASA and statin

## 2024-03-02 DIAGNOSIS — R0902 Hypoxemia: Secondary | ICD-10-CM | POA: Diagnosis not present

## 2024-03-02 DIAGNOSIS — G4730 Sleep apnea, unspecified: Secondary | ICD-10-CM | POA: Diagnosis not present

## 2024-03-03 ENCOUNTER — Other Ambulatory Visit: Payer: Self-pay

## 2024-03-03 ENCOUNTER — Telehealth: Payer: Self-pay

## 2024-03-03 NOTE — Telephone Encounter (Signed)
 Spoke with Patient's wife who is the primary caretaker of the patient to relay results, and she wanted to relay to Dr. Tamea that this pt has extensive short term memory loss, and reportedly doesn't remember even coming in to see Dr Tamea.  He has been non-compliant with his oxygen  use despite having numerous containers of oxygen  at home (13 of them), that she doesn't feel comfortable having around the house.   She expressed understanding of his results and will try to encourage the patient to comply.

## 2024-03-03 NOTE — Telephone Encounter (Signed)
 Contacted pt to relay results of ONO. Per Dr. Tamea, he will need 2L or O2 at night and we can place that order if he is agreeable. LVM for pt to call us  back to get these results.

## 2024-03-12 ENCOUNTER — Ambulatory Visit
Admission: RE | Admit: 2024-03-12 | Discharge: 2024-03-12 | Disposition: A | Discharge status: Home / Self Care | Source: Ambulatory Visit | Attending: Pulmonary Disease | Admitting: Pulmonary Disease

## 2024-03-12 DIAGNOSIS — J439 Emphysema, unspecified: Secondary | ICD-10-CM | POA: Insufficient documentation

## 2024-03-12 DIAGNOSIS — R918 Other nonspecific abnormal finding of lung field: Secondary | ICD-10-CM | POA: Diagnosis not present

## 2024-03-26 ENCOUNTER — Ambulatory Visit: Payer: Self-pay | Admitting: Pulmonary Disease

## 2024-03-30 ENCOUNTER — Other Ambulatory Visit: Payer: Self-pay | Admitting: Internal Medicine

## 2024-03-31 ENCOUNTER — Telehealth: Payer: Self-pay

## 2024-03-31 NOTE — Telephone Encounter (Signed)
 Doing recert for oxygen . The last OV Notes states he does not use it. If he is to discontinue, fax order to [phone]

## 2024-03-31 NOTE — Telephone Encounter (Signed)
 Copied from CRM [id number]. Topic: General - Other >> Mar 31, 2024  2:34 PM [place] A wrote: Reason for CRM: Val from [employer] called and would like office notes, order or prescription. Her call back number is [phone]

## 2024-04-01 NOTE — Telephone Encounter (Signed)
 I faxed the form back to [employer] with a note that it should go to his pulmonologist.

## 2024-04-03 ENCOUNTER — Encounter: Payer: Self-pay | Admitting: Pulmonary Disease

## 2024-04-08 ENCOUNTER — Other Ambulatory Visit: Payer: Self-pay

## 2024-04-09 MED ORDER — ISOSORBIDE MONONITRATE ER 30 MG PO TB24: 30.0000 mg | ORAL_TABLET | Freq: Every day | ORAL | 1 refills | Status: AC

## 2024-04-24 ENCOUNTER — Encounter: Payer: Self-pay | Admitting: Pulmonary Disease

## 2024-04-24 ENCOUNTER — Ambulatory Visit: Admitting: Pulmonary Disease

## 2024-04-24 ENCOUNTER — Ambulatory Visit (INDEPENDENT_AMBULATORY_CARE_PROVIDER_SITE_OTHER): Admitting: Pulmonary Disease

## 2024-04-24 VITALS — BP 140/76 | HR 71 | Temp 97.6°F | Ht 69.0 in | Wt 135.6 lb

## 2024-04-24 DIAGNOSIS — J449 Chronic obstructive pulmonary disease, unspecified: Secondary | ICD-10-CM

## 2024-04-24 DIAGNOSIS — J45909 Unspecified asthma, uncomplicated: Secondary | ICD-10-CM | POA: Diagnosis not present

## 2024-04-24 DIAGNOSIS — G4736 Sleep related hypoventilation in conditions classified elsewhere: Secondary | ICD-10-CM | POA: Diagnosis not present

## 2024-04-24 DIAGNOSIS — J4489 Other specified chronic obstructive pulmonary disease: Secondary | ICD-10-CM

## 2024-04-24 DIAGNOSIS — J439 Emphysema, unspecified: Secondary | ICD-10-CM | POA: Diagnosis not present

## 2024-04-24 DIAGNOSIS — Z23 Encounter for immunization: Secondary | ICD-10-CM

## 2024-04-24 LAB — PULMONARY FUNCTION TEST
DL/VA % pred: 48 %
DL/VA: 1.84 ml/min/mmHg/L
DLCO unc % pred: 42 %
DLCO unc: 9.8 ml/min/mmHg
FEF 25-75 Post: 1.11 L/s
FEF 25-75 Pre: 0.8 L/s
FEF2575-%Change-Post: 37 %
FEF2575-%Pred-Post: 65 %
FEF2575-%Pred-Pre: 47 %
FEV1-%Change-Post: 13 %
FEV1-%Pred-Post: 70 %
FEV1-%Pred-Pre: 62 %
FEV1-Post: 1.83 L
FEV1-Pre: 1.61 L
FEV1FVC-%Change-Post: -4 %
FEV1FVC-%Pred-Pre: 76 %
FEV6-%Change-Post: 18 %
FEV6-%Pred-Post: 103 %
FEV6-%Pred-Pre: 87 %
FEV6-Post: 3.55 L
FEV6-Pre: 2.99 L
FEV6FVC-%Pred-Post: 107 %
FEV6FVC-%Pred-Pre: 107 %
FVC-%Change-Post: 18 %
FVC-%Pred-Post: 96 %
FVC-%Pred-Pre: 80 %
FVC-Post: 3.55 L
FVC-Pre: 2.99 L
Post FEV1/FVC ratio: 51 %
Post FEV6/FVC ratio: 100 %
Pre FEV1/FVC ratio: 54 %
Pre FEV6/FVC Ratio: 100 %

## 2024-04-24 MED ORDER — TRELEGY ELLIPTA 100-62.5-25 MCG/ACT IN AEPB: 1.0000 | INHALATION_SPRAY | Freq: Every day | RESPIRATORY_TRACT | 5 refills | Status: AC

## 2024-04-24 NOTE — Patient Instructions (Signed)
 Full PFT completed today ? ?

## 2024-04-24 NOTE — Patient Instructions (Signed)
 VISIT SUMMARY:  You came in today for a follow-up on your breathing difficulties. We discussed your COPD, emphysema, and asthma, as well as your past reaction to the flu shot. We also talked about the possibility of early dementia.  YOUR PLAN:  -CHRONIC OBSTRUCTIVE PULMONARY DISEASE (COPD) WITH EMPHYSEMA AND ASTHMA: COPD is a chronic lung disease that makes it hard to breathe, often caused by smoking. Emphysema is a type of COPD that damages the air sacs in the lungs, and asthma is a condition where your airways narrow and swell. Your lung function is currently around 62%, but it can improve to 70% with regular use of your Trelegy inhaler. Please use your Trelegy inhaler once daily to help improve your lung function.  -HISTORY OF ADVERSE REACTION TO INFLUENZA VACCINE: You have had chills after receiving the flu shot in the past, which is a common reaction. You are willing to get the flu shot again. If you experience chills after the vaccination, you can take Tylenol  to help manage them.  INSTRUCTIONS:  Please schedule a follow-up appointment in six months to monitor your COPD and overall health. If you experience any new or worsening symptoms, contact our office immediately.

## 2024-04-24 NOTE — Progress Notes (Signed)
 Full PFT completed today ? ?

## 2024-04-24 NOTE — Progress Notes (Signed)
 Subjective:    Patient ID: Timothy Eaton, male    DOB: 1941-05-24, 83 y.o.   MRN: [id number]  Patient Care Team: Jimmy Charlie FERNS, MD as PCP - General Nahser, Aleene PARAS, MD (Inactive) as PCP - Cardiology (Cardiology) Tamea Dedra CROME, MD as Consulting Physician (Pulmonary Disease)  Chief Complaint  Patient presents with   COPD    BACKGROUND/INTERVAL:Patient is an 83 year old former smoker with a 66-pack-year history of smoking who presents for follow-up of shortness of breath on exertion.  Initially evaluated here on 13 February 2024.    HPI Discussed the use of AI scribe software for clinical note transcription with the patient, who gave verbal consent to proceed.  History of Present Illness   Timothy Eaton is an 83 year old male with COPD who presents for follow-up of the same.  He presents alone.  He experiences breathing difficulties, particularly during exertion. He feels the strain while breathing but does not regularly use his inhaler, though he occasionally uses it. He has a history of emphysema and asthma and has been prescribed Trelegy.  He quit smoking some time ago and does not currently smoke. A previous chest scan showed emphysema and some nodules on his lungs, which were not new findings.  Lung RADS 2 imaging.  He recalls these spots being present from before.  He has had a reaction to the flu shot in the past, experiencing chills.   He appears somewhat befuddled at times, per patient's primary caretaker (prior conversation) patient has significant short-term memory loss.  Patient has qualified for nocturnal oxygen .  He actually has oxygen  set up at home but is erratic in its use.  Discussed findings of PFTs with the patient in detail but not certain how much he retained of the information shared.    DATA 12/11/2023 CT angio chest: No pulmonary embolism, 14 x 9 mm subpleural nodule in the left lower lobe which appears to be enlarged compared to prior exam of 2023.   Consider short-term follow-up in 3 months, follow-up PET/CT or tissue sampling.  Severe pulmonary emphysema noted. 04/24/2024 PFTs: FEV1 1.61 L or 62% predicted, FVC 2.99 L or 80% predicted, FEV1/FVC 54%, there is significant bronchodilator response.  Patient could not perform lung volumes, diffusion capacity severely impaired.  Consistent with severe obstructive defect on the basis of emphysema  Review of Systems A 10 point review of systems was performed and it is as noted above otherwise negative.   Patient Active Problem List   Diagnosis Date Noted   Vascular dementia (HCC) 01/24/2024   Chronic hypoxemic respiratory failure (HCC) 12/12/2023   Complex renal cyst left kidney 12/11/2023   Decreased appetite 10/16/2023   Urinary frequency 10/16/2023   Elevated glucose 10/16/2023   Thrombocytopenia (HCC) 12/12/2022   Stage 3a chronic kidney disease (HCC) 01/09/2020   Nephrolithiasis 12/19/2019   Memory loss 12/09/2019   BPH with obstruction/lower urinary tract symptoms 07/07/2019   Preventative health care 04/09/2017   Advance directive discussed with patient 04/09/2017   COPD exacerbation (HCC) 07/29/2015   Coronary artery disease    Carotid artery disease (HCC)    History of postop paroxysmal atrial fibrillation 2005 [facility])    Pulmonary nodule 1 cm or greater in diameter 04/02/2011   COPD (chronic obstructive pulmonary disease) with emphysema (HCC) 04/02/2009   HEMORRHOIDS, HX OF 04/02/2009   Hyperlipidemia 06/25/2008   Essential hypertension 06/25/2008    Social History   Tobacco Use   Smoking status: Former  Current packs/day: 0.00    Average packs/day: 2.0 packs/day for 33.0 years (66.0 ttl pk-yrs)    Types: Cigarettes    Start date: 08/06/1965    Quit date: 08/06/1998    Years since quitting: 25.7   Smokeless tobacco: Current    Types: Chew  Substance Use Topics   Alcohol use: No    Allergies  Allergen Reactions   Sulfamethoxazole-Trimethoprim Rash    Current  Meds  Medication Sig   albuterol  (VENTOLIN  HFA) 108 (90 Base) MCG/ACT inhaler Inhale 2 puffs into the lungs every 6 (six) hours as needed for wheezing or shortness of breath.   aspirin  81 MG EC tablet Take 81 mg by mouth daily.     hydrochlorothiazide  (HYDRODIURIL ) 12.5 MG tablet Take 1 tablet (12.5 mg total) by mouth daily.   isosorbide  mononitrate (IMDUR ) 30 MG 24 hr tablet Take 1 tablet (30 mg total) by mouth daily.   metoprolol  tartrate (LOPRESSOR ) 25 MG tablet Take 1 tablet (25 mg total) by mouth 2 (two) times daily. (Patient taking differently: Take 25 mg by mouth 2 (two) times daily. Once a day)   nitroGLYCERIN  (NITROSTAT ) 0.4 MG SL tablet Place 1 tablet (0.4 mg total) under the tongue every 5 (five) minutes as needed (for chest pain or severe dyspnea).   potassium chloride  SA (KLOR-CON  M) 20 MEQ tablet TAKE 1 TABLET(20 MEQ) BY MOUTH DAILY   rosuvastatin  (CRESTOR ) 20 MG tablet Take 1 tablet (20 mg total) by mouth daily.   tamsulosin  (FLOMAX ) 0.4 MG CAPS capsule TAKE 1 CAPSULE(0.4 MG) BY MOUTH DAILY   [DISCONTINUED] Fluticasone-Umeclidin-Vilant (TRELEGY ELLIPTA ) 100-62.5-25 MCG/ACT AEPB Inhale 1 puff into the lungs daily in the afternoon.    Immunization History  Administered Date(s) Administered   INFLUENZA, HIGH DOSE SEASONAL PF 04/27/2019, 04/10/2023, 04/24/2024   Influenza Split 06/04/2012   Influenza Whole 04/19/2010   Influenza, Seasonal, Injecte, Preservative Fre 05/01/2016   Influenza,inj,Quad PF,6+ Mos 04/09/2017   Influenza-Unspecified 06/06/2018, 05/01/2022   Pneumococcal Conjugate-13 04/09/2017   Pneumococcal Polysaccharide-23 06/25/2008   Td 08/07/1999, 04/19/2010        Objective:     BP (!) 140/76   Pulse 71   Temp 97.6 F (36.4 C) (Temporal)   Ht 5' 9 (1.753 m)   Wt 135 lb 9.6 oz (61.5 kg)   SpO2 98%   BMI 20.02 kg/m   SpO2: 98 %  GENERAL: Thin elderly male, no acute distress.  Fully ambulatory.  No conversational dyspnea. HEAD: Normocephalic,  atraumatic.  EYES: Pupils equal, round, reactive to light.  No scleral icterus.  MOUTH: Edentulous, oral mucosa moist.  No thrush. NECK: Supple. No thyromegaly. Trachea midline. No JVD.  No adenopathy. PULMONARY: Diminished breath sounds bilaterally.  Coarse, otherwise no adventitious sounds. CARDIOVASCULAR: S1 and S2. Regular rate and rhythm.  No rubs, murmurs or gallops heard. ABDOMEN: Benign. MUSCULOSKELETAL: No joint deformity, no clubbing, no edema.  NEUROLOGIC: No overt focal deficit, no gait disturbance, speech is fluent. SKIN: Intact,warm,dry. PSYCH: Mood and behavior normal.  Occasionally befuddled.        Assessment & Plan:     ICD-10-CM   1. COPD, severe (HCC)  J44.9     2. COPD with asthma (HCC)  J44.89     3. Nocturnal hypoxemia due to emphysema (HCC)  J43.9    G47.36     4. Need for influenza vaccination  Z23 Flu vaccine HIGH DOSE PF(Fluzone Trivalent)      Orders Placed This Encounter  Procedures   Flu vaccine  HIGH DOSE PF(Fluzone Trivalent)    Meds ordered this encounter  Medications   Fluticasone-Umeclidin-Vilant (TRELEGY ELLIPTA ) 100-62.5-25 MCG/ACT AEPB    Sig: Inhale 1 puff into the lungs daily in the afternoon.    Dispense:  60 each    Refill:  5    Lot Number?:   [id number]    Expiration Date?:   04/06/2025    Manufacturer?:   [employer]Smith[employer] [[id number]]    NDC:   9826-9112-38 [627739]    Quantity:   2   Discussion:    Chronic obstructive pulmonary disease with emphysema and asthma COPD with emphysema and asthma. No current smoking. Recent chest scan shows emphysema with no new concerning findings.  Has nocturnal hypoxemia however is erratic in use of oxygen  supplementation. - Instruct to use Trelegy inhaler once daily to improve lung function. - Please use oxygen  nocturnally as prescribed. - Schedule follow-up appointment in six months.  Suspected dementia Suspected early dementia.  Follow-up per primary MD.  History of adverse reaction to influenza  vaccine Chills following influenza vaccination, a common immune response. He is willing to receive the vaccine again and advised to take Tylenol  if chills occur post-vaccination. - Administer influenza vaccine. - Advise to take Tylenol  if chills occur post-vaccination.      Advised if symptoms do not improve or worsen, to please contact office for sooner follow up or seek emergency care.    I spent 40 minutes of dedicated to the care of this patient on the date of this encounter to include pre-visit review of records, face-to-face time with the patient discussing conditions above, post visit ordering of testing, clinical documentation with the electronic health record, making appropriate referrals as documented, and communicating necessary findings to members of the patients care team.     C. Leita Sanders, MD Advanced Bronchoscopy [facility]    *This note was generated using voice recognition software/Dragon and/or AI transcription program.  Despite best efforts to proofread, errors can occur which can change the meaning. Any transcriptional errors that result from this process are unintentional and may not be fully corrected at the time of dictation.

## 2024-08-27 ENCOUNTER — Encounter

## 2024-10-05 ENCOUNTER — Ambulatory Visit: Admitting: Cardiovascular Disease

## 2024-10-23 ENCOUNTER — Encounter
